# Patient Record
Sex: Male | Born: 1955 | Race: White | Hispanic: No | Marital: Married | Smoking: Never smoker
Health system: Southern US, Community
[De-identification: ages and names within clinical notes are randomized; demographics above are authoritative.]

## PROBLEM LIST (undated history)

## (undated) DIAGNOSIS — E785 Hyperlipidemia, unspecified: Secondary | ICD-10-CM

## (undated) DIAGNOSIS — M1990 Unspecified osteoarthritis, unspecified site: Secondary | ICD-10-CM

## (undated) DIAGNOSIS — J45909 Unspecified asthma, uncomplicated: Secondary | ICD-10-CM

## (undated) DIAGNOSIS — T7840XA Allergy, unspecified, initial encounter: Secondary | ICD-10-CM

## (undated) DIAGNOSIS — K219 Gastro-esophageal reflux disease without esophagitis: Secondary | ICD-10-CM

## (undated) DIAGNOSIS — N200 Calculus of kidney: Secondary | ICD-10-CM

## (undated) DIAGNOSIS — Z87442 Personal history of urinary calculi: Secondary | ICD-10-CM

## (undated) DIAGNOSIS — D509 Iron deficiency anemia, unspecified: Secondary | ICD-10-CM

## (undated) DIAGNOSIS — H6640 Suppurative otitis media, unspecified, unspecified ear: Secondary | ICD-10-CM

## (undated) DIAGNOSIS — R7309 Other abnormal glucose: Secondary | ICD-10-CM

## (undated) DIAGNOSIS — I10 Essential (primary) hypertension: Secondary | ICD-10-CM

## (undated) HISTORY — DX: Suppurative otitis media, unspecified, unspecified ear: H66.40

## (undated) HISTORY — DX: Allergy, unspecified, initial encounter: T78.40XA

## (undated) HISTORY — PX: HERNIA REPAIR: SHX51

## (undated) HISTORY — DX: Other abnormal glucose: R73.09

## (undated) HISTORY — DX: Gastro-esophageal reflux disease without esophagitis: K21.9

## (undated) HISTORY — PX: BACK SURGERY: SHX140

## (undated) HISTORY — PX: OTHER SURGICAL HISTORY: SHX169

## (undated) HISTORY — DX: Hyperlipidemia, unspecified: E78.5

## (undated) HISTORY — PX: TONSILLECTOMY: SUR1361

## (undated) HISTORY — DX: Unspecified asthma, uncomplicated: J45.909

## (undated) HISTORY — DX: Iron deficiency anemia, unspecified: D50.9

---

## 1980-01-30 HISTORY — PX: BIOPSY BOWEL: PRO7

## 1999-01-30 DIAGNOSIS — N200 Calculus of kidney: Secondary | ICD-10-CM

## 1999-01-30 HISTORY — DX: Calculus of kidney: N20.0

## 2002-01-09 ENCOUNTER — Encounter (HOSPITAL_BASED_OUTPATIENT_CLINIC_OR_DEPARTMENT_OTHER): Payer: Self-pay | Admitting: General Surgery

## 2002-01-12 ENCOUNTER — Ambulatory Visit (HOSPITAL_COMMUNITY): Admission: RE | Admit: 2002-01-12 | Discharge: 2002-01-12 | Payer: Self-pay | Admitting: General Surgery

## 2006-07-02 LAB — HM COLONOSCOPY

## 2009-07-07 ENCOUNTER — Encounter: Payer: Self-pay | Admitting: Family Medicine

## 2009-07-22 ENCOUNTER — Ambulatory Visit: Payer: Self-pay | Admitting: Family Medicine

## 2009-07-22 DIAGNOSIS — R944 Abnormal results of kidney function studies: Secondary | ICD-10-CM | POA: Insufficient documentation

## 2009-07-22 DIAGNOSIS — D509 Iron deficiency anemia, unspecified: Secondary | ICD-10-CM | POA: Insufficient documentation

## 2009-07-22 DIAGNOSIS — E785 Hyperlipidemia, unspecified: Secondary | ICD-10-CM | POA: Insufficient documentation

## 2009-07-22 DIAGNOSIS — J45909 Unspecified asthma, uncomplicated: Secondary | ICD-10-CM | POA: Insufficient documentation

## 2009-07-22 DIAGNOSIS — R7309 Other abnormal glucose: Secondary | ICD-10-CM | POA: Insufficient documentation

## 2009-07-22 DIAGNOSIS — K219 Gastro-esophageal reflux disease without esophagitis: Secondary | ICD-10-CM | POA: Insufficient documentation

## 2009-07-22 HISTORY — DX: Hyperlipidemia, unspecified: E78.5

## 2009-07-22 HISTORY — DX: Unspecified asthma, uncomplicated: J45.909

## 2009-07-22 HISTORY — DX: Other abnormal glucose: R73.09

## 2009-07-22 HISTORY — DX: Gastro-esophageal reflux disease without esophagitis: K21.9

## 2009-07-22 HISTORY — DX: Iron deficiency anemia, unspecified: D50.9

## 2009-07-26 ENCOUNTER — Ambulatory Visit: Payer: Self-pay | Admitting: Family Medicine

## 2009-07-28 ENCOUNTER — Telehealth: Payer: Self-pay | Admitting: Family Medicine

## 2009-08-03 LAB — CONVERTED CEMR LAB
BUN: 18 mg/dL (ref 6–23)
CO2: 32 meq/L (ref 19–32)
Calcium: 9.2 mg/dL (ref 8.4–10.5)
Chloride: 104 meq/L (ref 96–112)
Creatinine, Ser: 1.2 mg/dL (ref 0.4–1.5)
Ferritin: 20.9 ng/mL — ABNORMAL LOW (ref 22.0–322.0)
GFR calc non Af Amer: 67.24 mL/min (ref >60)
Glucose, Bld: 107 mg/dL — ABNORMAL HIGH (ref 70–99)
Iron: 65 ug/dL (ref 42–165)
Potassium: 4.8 meq/L (ref 3.5–5.1)
Saturation Ratios: 15.2 % — ABNORMAL LOW (ref 20.0–50.0)
Sodium: 140 meq/L (ref 135–145)
Transferrin: 305.4 mg/dL (ref 212.0–360.0)

## 2009-09-26 ENCOUNTER — Encounter: Payer: Self-pay | Admitting: Family Medicine

## 2009-10-06 ENCOUNTER — Encounter: Payer: Self-pay | Admitting: Family Medicine

## 2009-11-09 ENCOUNTER — Ambulatory Visit: Payer: Self-pay | Admitting: Family Medicine

## 2009-11-09 DIAGNOSIS — H6640 Suppurative otitis media, unspecified, unspecified ear: Secondary | ICD-10-CM

## 2009-11-09 HISTORY — DX: Suppurative otitis media, unspecified, unspecified ear: H66.40

## 2009-11-18 ENCOUNTER — Ambulatory Visit: Payer: Self-pay | Admitting: Family Medicine

## 2010-01-11 ENCOUNTER — Ambulatory Visit: Payer: Self-pay | Admitting: Family Medicine

## 2010-01-12 LAB — CONVERTED CEMR LAB
Basophils Absolute: 0 10*3/uL (ref 0.0–0.1)
Basophils Relative: 0.7 % (ref 0.0–3.0)
Eosinophils Absolute: 0.2 10*3/uL (ref 0.0–0.7)
Eosinophils Relative: 3.4 % (ref 0.0–5.0)
Ferritin: 16.1 ng/mL — ABNORMAL LOW (ref 22.0–322.0)
HCT: 37.6 % — ABNORMAL LOW (ref 39.0–52.0)
Hemoglobin: 13 g/dL (ref 13.0–17.0)
Iron: 55 ug/dL (ref 42–165)
Lymphocytes Relative: 35.4 % (ref 12.0–46.0)
Lymphs Abs: 1.9 10*3/uL (ref 0.7–4.0)
MCHC: 34.6 g/dL (ref 30.0–36.0)
MCV: 86.5 fL (ref 78.0–100.0)
Monocytes Absolute: 0.4 10*3/uL (ref 0.1–1.0)
Monocytes Relative: 7.6 % (ref 3.0–12.0)
Neutro Abs: 2.8 10*3/uL (ref 1.4–7.7)
Neutrophils Relative %: 52.9 % (ref 43.0–77.0)
Platelets: 198 10*3/uL (ref 150.0–400.0)
RBC: 4.35 M/uL (ref 4.22–5.81)
RDW: 13.3 % (ref 11.5–14.6)
WBC: 5.3 10*3/uL (ref 4.5–10.5)

## 2010-02-28 NOTE — Letter (Signed)
Summary: [facility]  [facility]   Imported By: Sherian Rein 10/05/2009 10:33:19  _____________________________________________________________________  External Attachment:    Type:   Image     Comment:   External Document

## 2010-02-28 NOTE — Assessment & Plan Note (Signed)
Summary: NEW TO EST//CCM   Vital Signs:  Patient profile:   55 year old male Height:      73.50 inches Weight:      219 pounds BMI:     28.60 Temp:     98.4 degrees F oral Pulse rate:   80 / minute Pulse rhythm:   regular Resp:     12 per minute BP sitting:   138 / 98  (left arm) Cuff size:   regular  Vitals Entered By: Sid Falcon LPN (July 22, 2009 3:00 PM)  Nutrition Counseling: Patient's BMI is greater than 25 and therefore counseled on weight management options. CC: new to establish   History of Present Illness: New pt to establish care.  PMH reviewed and signif for :  Hx kidney stones, GERD, hyperlipidemia, childhood asthma, ?hx rheumatic fever, colon polyps (benign), and Crohn's Disease.  Lipids and GERD stable.  REcent outside labs reviewed.  Recent labs through his company signif for: glucose of 106 (119 one year ago) creatinine 1.48 (1.38 one year ago) and no prior evaluation. TSH high normal range (4.7 but 6.14 one year ago). Mildly low serum iron of 38 with normal hgb and normal Fe one year ago.  EGD and colonoscopy 2007 or 2008 and reportedly normal.  No recent abd pain, appetite change, melena, hematochezia, or weight change.  no diarrhea.  Nonvegetarian.  FH signif for Father with colon cancer.  Grandparents with hypertension and stroke.  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  nonsmoker.  Occ ETOH.  Preventive Screening-Counseling & Management  Alcohol-Tobacco     Smoking Status: never  Caffeine-Diet-Exercise     Does Patient Exercise: no  Past History:  Family History: Last updated: 07/22/2009 Father, colon cancer maternal grandparents, stroke, hypertension  Social History: Last updated: 07/22/2009 Occupation:  Energy manager Married Never Smoked Alcohol use-yes Regular exercise-no  Risk Factors: Exercise: no (07/22/2009)  Risk Factors: Smoking Status: never (07/22/2009)  Past Medical History: Asthma GERD Hyperlipidemia Kidney  stones Polyps in colon ?rheumatic fever blood transfusion 1981 prediabetes  Past Surgical History: Exploratory bowel surgery - chrons disease 1981  Family History: Father, colon cancer maternal grandparents, stroke, hypertension  Social History: Occupation:  Energy manager Married Never Smoked Alcohol use-yes Regular exercise-no Smoking Status:  never Occupation:  employed Does Patient Exercise:  no  Review of Systems  The patient denies anorexia, weight loss, weight gain, chest pain, syncope, dyspnea on exertion, peripheral edema, prolonged cough, headaches, hemoptysis, abdominal pain, melena, hematochezia, severe indigestion/heartburn, muscle weakness, and enlarged lymph nodes.    Physical Exam  General:  Well-developed,well-nourished,in no acute distress; alert,appropriate and cooperative throughout examination Eyes:  pupils equal, pupils round, and pupils reactive to light.   Ears:  External ear exam shows no significant lesions or deformities.  Otoscopic examination reveals clear canals, tympanic membranes are intact bilaterally without bulging, retraction, inflammation or discharge. Hearing is grossly normal bilaterally. Mouth:  Oral mucosa and oropharynx without lesions or exudates.  Teeth in good repair. Neck:  No deformities, masses, or tenderness noted. Lungs:  Normal respiratory effort, chest expands symmetrically. Lungs are clear to auscultation, no crackles or wheezes. Heart:  Normal rate and regular rhythm. S1 and S2 normal without gallop, murmur, click, rub or other extra sounds. Abdomen:  Bowel sounds positive,abdomen soft and non-tender without masses, organomegaly or hernias noted. Extremities:  No clubbing, cyanosis, edema, or deformity noted with normal full range of motion of all joints.   Neurologic:  alert & oriented X3 and cranial nerves  II-XII intact.   Cervical Nodes:  No lymphadenopathy noted Psych:  normally interactive, good eye contact, not anxious  appearing, and not depressed appearing.     Impression & Recommendations:  Problem # 1:  PREDIABETES (ICD-790.29) discussed diet and lifestsyle management.  will schedule repeat lab with fasting glucose.  Problem # 2:  ANEMIA, IRON DEFICIENCY (ICD-280.9) Low Fe of uncertain signif .  Pt did donate blood 2 weeks prior but doubt signif.  will repeat along with ferritin and TIBC. If iron remains low consult with GI.  Problem # 3:  GERD (ICD-530.81) stable on current med. His updated medication list for this problem includes:    Protonix 40 Mg Tbec (Pantoprazole sodium) ..... Once daily  Problem # 4:  HYPERLIPIDEMIA (ICD-272.4) recent labs reviewed and lipids stable. His updated medication list for this problem includes:    Simvastatin 40 Mg Tabs (Simvastatin) ..... One by mouth at bedtime  Problem # 5:  NONSPECIFIC ABNORM RESULTS KIDNEY FUNCTION STUDY (ICD-794.4) new finding.  Will repeat.  Complete Medication List: 1)  Protonix 40 Mg Tbec (Pantoprazole sodium) .... Once daily 2)  Simvastatin 40 Mg Tabs (Simvastatin) .... One by mouth at bedtime  Patient Instructions: 1)  Schedule the following labs: 2)  BMP  790.29 3)  Ferritin, serum iron, TIBC  280.9 Prescriptions: SIMVASTATIN 40 MG TABS (SIMVASTATIN) one by mouth at bedtime  #90 x 3   Entered and Authorized by:   Evelena Peat MD   Signed by:   Evelena Peat MD on 07/22/2009   Method used:   Faxed to ...       [facility] (mail-order)       [address]       [place], [place]  [address]       Ph: [phone]       Fax: [phone]   RxID:   [id number] PROTONIX 40 MG TBEC (PANTOPRAZOLE SODIUM) once daily  #90 x 3   Entered and Authorized by:   Evelena Peat MD   Signed by:   Evelena Peat MD on 07/22/2009   Method used:   Faxed to ...       [address] [facility] (mail-order)       [address]       [place], [place]  [address]       Ph: [phone]       Fax: [phone]   RxID:   [id number]   Preventive Care  Screening  Colonoscopy:    Date:  06/30/2006    Results:  Adenomatous Polyp

## 2010-02-28 NOTE — Procedures (Signed)
Summary: EGD, Colonoscopy Reports/[facility]  EGD, Colonoscopy Reports/[facility]   Imported By: Maryln Gottron 10/13/2009 14:32:08  _____________________________________________________________________  External Attachment:    Type:   Image     Comment:   External Document

## 2010-02-28 NOTE — Assessment & Plan Note (Signed)
Summary: EAR INFECTION//SLM   Vital Signs:  Patient profile:   55 year old male Temp:     98.0 degrees F oral BP sitting:   136 / 88  (left arm) Cuff size:   regular  Vitals Entered By: Sid Falcon LPN (November 09, 2009 2:11 PM)  History of Present Illness: Chronic drainage right ear greater than left for over one year. Mostly clear but recently colored discharge. Intermittent ringing right ear but no hearing change. No fevers or chills. No nasal congestive symptoms.  No ear pain.  No known hx of perforation.  No hx of T tubes.  Recent evaluation per GI for mild iron deficiency in absence of anemia.  Allergies (verified): No Known Drug Allergies  Past History:  Past Medical History: Last updated: 07/22/2009 Asthma GERD Hyperlipidemia Kidney stones Polyps in colon ?rheumatic fever blood transfusion 1981 prediabetes  Physical Exam  General:  Well-developed,well-nourished,in no acute distress; alert,appropriate and cooperative throughout examination Ears:  right eardrum reveals erythema, distortion of landmarks, and supperative effusion with some purulent drainage in the canal. Left eardrum and ear canal appear relatively normal Mouth:  Oral mucosa and oropharynx without lesions or exudates.  Teeth in good repair. Neck:  No deformities, masses, or tenderness noted.   Impression & Recommendations:  Problem # 1:  OTITIS MEDIA, SUPPURATIVE (ICD-382.4) start oral and topical antibiotic and keep ear dry.  consider ENT referral if persists. His updated medication list for this problem includes:    Amoxicillin 875 Mg Tabs (Amoxicillin) ..... One by mouth two times a day for 10 days  Complete Medication List: 1)  Protonix 40 Mg Tbec (Pantoprazole sodium) .... Once daily 2)  Simvastatin 40 Mg Tabs (Simvastatin) .... One by mouth at bedtime 3)  Amoxicillin 875 Mg Tabs (Amoxicillin) .... One by mouth two times a day for 10 days 4)  Cipro Hc 0.2-1 % Susp  (Ciprofloxacin-hydrocortisone) .... 5 drops r ear two times a day for 7 days  Patient Instructions: 1)  Schedule the following labs for early December:  Ferritin, Serum Fe, CBC  280.9 Prescriptions: AMOXICILLIN 875 MG TABS (AMOXICILLIN) one by mouth two times a day for 10 days  #20 x 0   Entered and Authorized by:   Evelena Peat MD   Signed by:   Evelena Peat MD on 11/09/2009   Method used:   Electronically to        [facility] [facility] [phone]* (retail)       [address] [place], [place]  [address]       Ph: [phone] or [phone]       Fax: [phone]   RxID:   [id number] CIPRO HC 0.2-1 % SUSP (CIPROFLOXACIN-HYDROCORTISONE) 5 drops R ear two times a day for 7 days  #10 ml x 1   Entered and Authorized by:   Evelena Peat MD   Signed by:   Evelena Peat MD on 11/09/2009   Method used:   Electronically to        [facility]  [address] [phone]* (retail)       [address] [place], [place]  [address]       Ph: [phone] or [phone]       Fax: [phone]   RxID:   [id number] AMOXICILLIN 875 MG TABS (AMOXICILLIN) one by mouth two times a  day for 10 days  #20 x 0   Entered and Authorized by:   Evelena Peat MD   Signed by:   Evelena Peat MD on 11/09/2009   Method used:   Print then Give to Patient   RxID:   [id number]

## 2010-02-28 NOTE — Progress Notes (Signed)
Summary: Pt returning Dr Lucie Leather call  Phone Note Other Incoming Call back at Work Phone [phone]   Caller: Patient Summary of Call: VM from pt returning your call from yesterday.  Please call him at work today [phone] Initial call taken by: Sid Falcon LPN,  July 28, 2009 9:02 AM  Follow-up for Phone Call        spoke with pt.  Pt has low ferritin and Fe saturation.  Normal Fe.  Hgb normal recently.  colonoscopy 3 years ago per pt.  Will discuss with his GI regarding whether to evaluate further.  Minimally consider hemoccult cards. Follow-up by: Evelena Peat MD,  July 28, 2009 6:04 PM

## 2010-02-28 NOTE — Assessment & Plan Note (Signed)
Summary: EAR ISSUES//CCM   Vital Signs:  Patient profile:   55 year old male Temp:     98.7 degrees F oral BP sitting:   138 / 94  (left arm) Cuff size:   large  Vitals Entered By: Sid Falcon LPN (November 18, 2009 3:59 PM)  History of Present Illness: Followup right supperative otitis media with evidence for purulent secretions in canal. Patient was treated with Cipro HC drops and amoxicillin. Overall slightly improved still has some fullness in the right ear. No vertigo. No hearing changes. Denies fever or chills. Pain seems to be worse early in the morning.  Allergies (verified): No Known Drug Allergies  Past History:  Past Medical History: Last updated: 07/22/2009 Asthma GERD Hyperlipidemia Kidney stones Polyps in colon ?rheumatic fever blood transfusion 1981 prediabetes  Physical Exam  General:  Well-developed,well-nourished,in no acute distress; alert,appropriate and cooperative throughout examination Ears:  L TM normal.  R TM is retracted and moderately erythematous.  Minimal drainage in canal.  No obvious perforations. Mouth:  Oral mucosa and oropharynx without lesions or exudates.  Teeth in good repair. Neck:  No deformities, masses, or tenderness noted. Lungs:  Normal respiratory effort, chest expands symmetrically. Lungs are clear to auscultation, no crackles or wheezes.   Impression & Recommendations:  Problem # 1:  OTITIS MEDIA, SUPPURATIVE (ICD-382.4) Cont cipro drops and start Augmentin.  ENT referral if no better in one week. His updated medication list for this problem includes:    Amoxicillin-pot Clavulanate 875-125 Mg Tabs (Amoxicillin-pot clavulanate) ..... One by mouth two times a day for 10 days  Complete Medication List: 1)  Protonix 40 Mg Tbec (Pantoprazole sodium) .... Once daily 2)  Simvastatin 40 Mg Tabs (Simvastatin) .... One by mouth at bedtime 3)  Amoxicillin-pot Clavulanate 875-125 Mg Tabs (Amoxicillin-pot clavulanate) .... One by  mouth two times a day for 10 days 4)  Cipro Hc 0.2-1 % Susp (Ciprofloxacin-hydrocortisone) .... 5 drops r ear two times a day for 7 days  Patient Instructions: 1)  Be in touch by next week if symptoms not further improved. Prescriptions: AMOXICILLIN-POT CLAVULANATE 875-125 MG TABS (AMOXICILLIN-POT CLAVULANATE) one by mouth two times a day for 10 days  #20 x 0   Entered and Authorized by:   Evelena Peat MD   Signed by:   Evelena Peat MD on 11/18/2009   Method used:   [facility]ron[facility] to        [facility]  [address] [facility] [phone]* (retail)       [address] [place], [place]  [address]       Ph: [phone] or [phone]       Fax: [phone]   RxID:   [id number]    Orders Added: 1)  Est. Patient Level III [[address]]

## 2010-06-16 NOTE — Op Note (Signed)
NAME:  DRAE, MITZEL                          ACCOUNT NO.:  [id number]   MEDICAL RECORD NO.:  [id number]                   PATIENT TYPE:  OIB   LOCATION:  2896                                 FACILITY:  [facility]   PHYSICIAN:  Leonie Man, M.D.                DATE OF BIRTH:  1956/01/12   DATE OF PROCEDURE:  01/12/2002  DATE OF DISCHARGE:                                 OPERATIVE REPORT   PREOPERATIVE DIAGNOSIS:  Left inguinal hernia.   POSTOPERATIVE DIAGNOSIS:  Left inguinal hernia.   PROCEDURE:  Left inguinal herniorrhaphy with mesh.   SURGEON:  Leonie Man, M.D.   ASSISTANT:  Nurse.   ANESTHESIA:  General.   BRIEF NOTE:  The patient is a 55 year old man presenting with an enlarging  left-sided groin bulge.  This has been present for the past 3-6 months with  some mild associated discomfort on coughing.  No history of incarceration,  bladder neck obstruction or chronic constipation.  The patient comes to the  operating room now after the risks and potential benefits of surgery had  been discussed.  All questions were answered and consent was obtained.   DESCRIPTION OF PROCEDURE:  Following the induction of satisfactory general  anesthesia, the patient was positioned supinely.  The lower abdomen was  prepped and draped to be included in a sterile operative field.  The region  of the lower abdominal crease was infiltrated with 0.5% Marcaine with  epinephrine and a transverse incision was made and deepened through the skin  and subcutaneous tissues down to the external oblique aponeurosis.  External  ring was identified and the external oblique aponeurosis was opened up  through the external ring with protection of the ilioinguinal nerve, which  was retracted medially and cephalad.  The spermatic cord was then elevated  and held with a Penrose drain and a large direct hernia was dissected free  from the medial aspect of the cord and reduced into the retroperitoneum.  A  large cord lipoma was dissected free up to the internal ring and also  transected between clamps and secured with ties of 2-0 silk.  There was no  indirect hernia noted.  The defect was then repaired with an onlay patch of  polypropylene mesh, which was sewn in at the pubic tubercle with a 2-0  Novofil suture and continued up along the conjoined tendon in a continuous  running suture up to the internal ring and again from the pubic tubercle up  along the shelving edge of Poupart's ligament to the internal ring.  Tails  of the mesh were then trimmed and sutured into the internal oblique muscle  thus closing off the internal ring and forming a new internal ring with  occlusion of the mesh.  The hernia repair was inspected and noted to be  intact.  Sponge, instrument and sharp counts were noted and verified.  Spermatic cord was  returned to its normal anatomic position.  All areas of  dissection were checked for hemostasis and noted to be dry.  External  oblique aponeurosis then closed over the cord with a running suture of 2-0  Vicryl.  Scarpa's fascia and subcuticular tissue were closed with a running  suture of 3-0 Vicryl.  Skin closed with a 4-0 Monocryl running suture.  The  wound was then reinforced with Steri-Strips.  Sterile dressings were  applied.  The anesthetic was reversed and the patient was removed from the  operating room to the recovery room in stable condition.  He tolerated the  procedure well.                                                Leonie Man, M.D.    PB/MEDQ  D:  01/12/2002  T:  01/12/2002  Job:  [id number]   cc:   Teena Irani. Arlyce Dice, M.D.  [address]  [place]  [place] [address]  Fax: [phone]

## 2010-08-01 ENCOUNTER — Ambulatory Visit (INDEPENDENT_AMBULATORY_CARE_PROVIDER_SITE_OTHER): Payer: Managed Care, Other (non HMO) | Admitting: Family Medicine

## 2010-08-01 ENCOUNTER — Encounter: Payer: Self-pay | Admitting: Family Medicine

## 2010-08-01 VITALS — BP 130/82 | Temp 98.1°F | Ht 73.5 in | Wt 217.0 lb

## 2010-08-01 DIAGNOSIS — Z299 Encounter for prophylactic measures, unspecified: Secondary | ICD-10-CM

## 2010-08-01 DIAGNOSIS — K219 Gastro-esophageal reflux disease without esophagitis: Secondary | ICD-10-CM

## 2010-08-01 DIAGNOSIS — E785 Hyperlipidemia, unspecified: Secondary | ICD-10-CM

## 2010-08-01 DIAGNOSIS — H65499 Other chronic nonsuppurative otitis media, unspecified ear: Secondary | ICD-10-CM

## 2010-08-01 MED ORDER — PANTOPRAZOLE SODIUM 40 MG PO TBEC: 40.0000 mg | DELAYED_RELEASE_TABLET | Freq: Every day | ORAL | Status: DC

## 2010-08-01 MED ORDER — SIMVASTATIN 40 MG PO TABS: 40.0000 mg | ORAL_TABLET | Freq: Every day | ORAL | Status: DC

## 2010-08-01 NOTE — Progress Notes (Signed)
  Subjective:    Patient ID: Eric Duran, male    DOB: 05-06-1955, 55 y.o.   MRN: [id number]  HPI Patient seen with several month history of bilateral ear pressure and intermittent tinnitus.  He also has some relatively continual clear drainage in both ear canals right greater than left. He feels hearing is off somewhat. Has intermittent headaches. No fever. Ear drainage mostly clear but occasionally slightly yellow. Denies any nasal congestion or  purulent nasal discharge.  Patient history hyperlipidemia and reflux. Symptoms stable. Needs refills of protonix and simvastatin. He has not had lipids in approximately one year. No history of CAD.  Past Medical History  Diagnosis Date  . HYPERLIPIDEMIA 07/22/2009  . ANEMIA, IRON DEFICIENCY 07/22/2009  . OTITIS MEDIA, SUPPURATIVE 11/09/2009  . ASTHMA 07/22/2009  . GERD 07/22/2009  . PREDIABETES 07/22/2009  . NONSPECIFIC ABNORM RESULTS KIDNEY FUNCTION STUDY 07/22/2009   Past Surgical History  Procedure Date  . Biopsy bowel     exploration bowel surgery, chrons disease    reports that he has never smoked. He does not have any smokeless tobacco history on file. His alcohol and drug histories not on file. family history includes Cancer in his father; Hypertension in his maternal grandfather; and Stroke in his maternal grandfather. No Known Allergies    Review of Systems  Constitutional: Negative for fever and chills.  HENT: Positive for tinnitus and ear discharge. Negative for ear pain, congestion, sore throat and postnasal drip.   Respiratory: Negative for cough and shortness of breath.   Cardiovascular: Negative for chest pain.  Hematological: Negative for adenopathy.       Objective:   Physical Exam  Constitutional: He appears well-developed and well-nourished.  HENT:  Head: Normocephalic and atraumatic.  Mouth/Throat: Oropharynx is clear and moist. No oropharyngeal exudate.       Patient has inflammatory changes right ear canal with  mild erythema and scaling. Nontender. No purulent secretions. Right tympanic membrane has distorted landmarks. No erythema. No obvious perforations. Left TM is relatively normal. No significant inflammatory changes left ear canal  Neck: Neck supple.  Cardiovascular: Normal rate, regular rhythm and normal heart sounds.   Pulmonary/Chest: Effort normal and breath sounds normal. No respiratory distress. He has no wheezes. He has no rales.  Musculoskeletal: He exhibits no edema.  Lymphadenopathy:    He has no cervical adenopathy.          Assessment & Plan:  #1 chronic drainage from both ears right greater than left. Suspect chronic effusion. He does not have evidence for active otitis externa. Given duration of symptoms recommend ENT referral #2 hyperlipidemia. Refill simvastatin for one year. Schedule fasting labs with lipid and hepatic and one to 2 months #3 GERD stable.  refill protonix for one year

## 2010-08-16 ENCOUNTER — Telehealth: Payer: Self-pay | Admitting: *Deleted

## 2010-08-16 DIAGNOSIS — E785 Hyperlipidemia, unspecified: Secondary | ICD-10-CM

## 2010-08-16 NOTE — Telephone Encounter (Signed)
[facility] called, his lab order is standing, needs to be changed to future so they can draw

## 2010-08-16 NOTE — Telephone Encounter (Signed)
Reordered labs.

## 2010-09-06 ENCOUNTER — Other Ambulatory Visit: Payer: Managed Care, Other (non HMO)

## 2010-09-08 ENCOUNTER — Other Ambulatory Visit: Payer: Managed Care, Other (non HMO)

## 2010-09-08 ENCOUNTER — Ambulatory Visit: Payer: Managed Care, Other (non HMO)

## 2010-09-08 DIAGNOSIS — E785 Hyperlipidemia, unspecified: Secondary | ICD-10-CM

## 2010-09-08 LAB — HEPATIC FUNCTION PANEL
ALT: 29 U/L (ref 0–53)
AST: 23 U/L (ref 0–37)
Albumin: 4.2 g/dL (ref 3.5–5.2)
Total Bilirubin: 0.8 mg/dL (ref 0.3–1.2)

## 2010-09-08 LAB — LIPID PANEL
HDL: 58.7 mg/dL (ref >39.00)
Triglycerides: 30 mg/dL (ref 0.0–149.0)

## 2010-09-12 NOTE — Progress Notes (Signed)
Quick Note:  Pt informed on home VM ______ 

## 2010-09-19 ENCOUNTER — Other Ambulatory Visit: Payer: Self-pay | Admitting: *Deleted

## 2010-09-19 DIAGNOSIS — K219 Gastro-esophageal reflux disease without esophagitis: Secondary | ICD-10-CM

## 2010-09-19 MED ORDER — SIMVASTATIN 40 MG PO TABS: 40.0000 mg | ORAL_TABLET | Freq: Every day | ORAL | Status: DC

## 2010-09-19 MED ORDER — PANTOPRAZOLE SODIUM 40 MG PO TBEC: 40.0000 mg | DELAYED_RELEASE_TABLET | Freq: Every day | ORAL | Status: DC

## 2011-02-13 ENCOUNTER — Encounter: Payer: Self-pay | Admitting: Family Medicine

## 2011-02-13 ENCOUNTER — Ambulatory Visit (INDEPENDENT_AMBULATORY_CARE_PROVIDER_SITE_OTHER): Payer: Managed Care, Other (non HMO) | Admitting: Family Medicine

## 2011-02-13 VITALS — BP 118/80 | HR 98 | Temp 98.1°F | Wt 226.0 lb

## 2011-02-13 DIAGNOSIS — K5090 Crohn's disease, unspecified, without complications: Secondary | ICD-10-CM | POA: Insufficient documentation

## 2011-02-13 DIAGNOSIS — R1013 Epigastric pain: Secondary | ICD-10-CM

## 2011-02-13 NOTE — Patient Instructions (Signed)
Follow up immediately for any fever, recurrent vomiting, or persistent abdominal pain Bland diet for the next several days.

## 2011-02-13 NOTE — Progress Notes (Addendum)
  Subjective:    Patient ID: Eric Duran, male    DOB: 24-Mar-1955, 56 y.o.   MRN: [id number]  HPI  Acute visit. Abdominal pain epigastric area fairly intense past couple of nights. Woke last night and Sunday night out of sleep with epigastric pain with some radiation bilaterally-and also toward back. Deep achy quality. Fairly intense. Each episode lasted about one to 2 hours. He did not have any nausea or vomiting. Some radiation toward the back. He had pizza on Sunday night and had some ice cream last night. Otherwise low-fat diet. He takes Protonix regularly. No recent active GERD symptoms. Denies chest pain. No fever or chills. No recent stool changes.   Patient's history of Crohn's disease but no recent active issues. No obstructive symptoms such as vomiting or distention.  Past Medical History  Diagnosis Date  . HYPERLIPIDEMIA 07/22/2009  . ANEMIA, IRON DEFICIENCY 07/22/2009  . OTITIS MEDIA, SUPPURATIVE 11/09/2009  . ASTHMA 07/22/2009  . GERD 07/22/2009  . PREDIABETES 07/22/2009  . NONSPECIFIC ABNORM RESULTS KIDNEY FUNCTION STUDY 07/22/2009   Past Surgical History  Procedure Date  . Biopsy bowel     exploration bowel surgery, chrons disease    reports that he has never smoked. He does not have any smokeless tobacco history on file. His alcohol and drug histories not on file. family history includes Cancer in his father; Hypertension in his maternal grandfather; and Stroke in his maternal grandfather. No Known Allergies    Review of Systems  Constitutional: Negative for fever and chills.  HENT: Negative for trouble swallowing.   Respiratory: Negative for shortness of breath.   Cardiovascular: Negative for chest pain, palpitations and leg swelling.  Gastrointestinal: Positive for abdominal pain. Negative for nausea, vomiting, diarrhea, constipation and blood in stool.  Genitourinary: Negative for dysuria.  Skin: Negative for rash.  Neurological: Negative for dizziness.    Hematological: Negative for adenopathy. Does not bruise/bleed easily.       Objective:   Physical Exam  Constitutional: He appears well-developed and well-nourished.  Cardiovascular: Normal rate, regular rhythm and normal heart sounds.   Pulmonary/Chest: Effort normal and breath sounds normal. No respiratory distress. He has no wheezes. He has no rales.  Abdominal: Soft. Bowel sounds are normal. He exhibits no distension and no mass. There is no rebound and no guarding.       Minimally tender epigastric region  Skin: No rash noted.          Assessment & Plan:  Epigastric abdominal pain. Given the episodic nature waking from sleep question symptomatic cholelithiasis. Clinically, this would not be compatible with pancreatitis and very low risk for peptic ulcer disease with protonix. No risk factors for gastritis.  No chest pain and no exertional symptoms so cardiac seems very unlikely.  Start with abdominal ultrasound.  Ultrasound reveals multiple gallstones. No gallbladder wall thickening. Patient denied further episodes. Discussed options. We've recommended seeing a general surgeon although explained observation might be recommended at this time if he has no further episodes

## 2011-02-14 ENCOUNTER — Ambulatory Visit
Admission: RE | Admit: 2011-02-14 | Discharge: 2011-02-14 | Disposition: A | Discharge status: Home / Self Care | Payer: Managed Care, Other (non HMO) | Source: Ambulatory Visit | Attending: Family Medicine | Admitting: Family Medicine

## 2011-02-14 DIAGNOSIS — R1013 Epigastric pain: Secondary | ICD-10-CM

## 2011-02-14 NOTE — Progress Notes (Signed)
Addended by: Kristian Covey on: 02/14/2011 01:06 PM   Modules accepted: Orders

## 2011-02-16 ENCOUNTER — Encounter (INDEPENDENT_AMBULATORY_CARE_PROVIDER_SITE_OTHER): Payer: Self-pay | Admitting: Surgery

## 2011-02-20 ENCOUNTER — Encounter (INDEPENDENT_AMBULATORY_CARE_PROVIDER_SITE_OTHER): Payer: Self-pay | Admitting: Surgery

## 2011-02-21 ENCOUNTER — Encounter (INDEPENDENT_AMBULATORY_CARE_PROVIDER_SITE_OTHER): Payer: Managed Care, Other (non HMO) | Admitting: Surgery

## 2011-02-22 ENCOUNTER — Ambulatory Visit (INDEPENDENT_AMBULATORY_CARE_PROVIDER_SITE_OTHER): Payer: Managed Care, Other (non HMO) | Admitting: Surgery

## 2011-02-22 ENCOUNTER — Encounter (INDEPENDENT_AMBULATORY_CARE_PROVIDER_SITE_OTHER): Payer: Self-pay | Admitting: Surgery

## 2011-02-22 DIAGNOSIS — R1011 Right upper quadrant pain: Secondary | ICD-10-CM

## 2011-02-22 DIAGNOSIS — K8020 Calculus of gallbladder without cholecystitis without obstruction: Secondary | ICD-10-CM | POA: Insufficient documentation

## 2011-02-22 NOTE — Patient Instructions (Signed)
[facility] LAPAROSCOPIC SURGERY: POST OP INSTRUCTIONS  Always review your discharge instruction sheet given to you by the facility where your surgery was performed.  1. A prescription for pain medication may be given to you upon discharge.  Take your pain medication as prescribed, if needed.  If narcotic pain medicine is not needed, then you may take acetaminophen (Tylenol) or ibuprofen (Advil) as needed. 2. Take your usually prescribed medications unless otherwise directed. 3. If you need a refill on your pain medication, please contact your pharmacy.  They will contact our office to request authorization. Prescriptions will not be filled after 5pm or on week-ends. 4. You should follow a light diet the first few days after arrival home, such as soup and crackers, etc.  Be sure to include lots of fluids daily. 5. Most patients will experience some swelling and bruising in the area of the incisions.  Ice packs will help.  Swelling and bruising can take several days to resolve.  6. It is common to experience some constipation if taking pain medication after surgery.  Increasing fluid intake and taking a stool softener (such as Colace) will usually help or prevent this problem from occurring.  A mild laxative (Milk of Magnesia or Miralax) should be taken according to package instructions if there are no bowel movements after 48 hours. 7. Unless discharge instructions indicate otherwise, you may remove your bandages 24-48 hours after surgery, and you may shower at that time.  You may have steri-strips (small skin tapes) in place directly over the incision.  These strips should be left on the skin for 7-10 days.  If your surgeon used skin glue on the incision, you may shower in 24 hours.  The glue will flake off over the next 2-3 weeks.  Any sutures or staples will be removed at the office during your follow-up visit. 8. ACTIVITIES:  You may resume regular (light) daily activities  beginning the next day-such as daily self-care, walking, climbing stairs-gradually increasing activities as tolerated.  You may have sexual intercourse when it is comfortable.  Refrain from any heavy lifting or straining until approved by your doctor. 9. You may drive when you are no longer taking prescription pain medication, you can comfortably wear a seatbelt, and you can safely maneuver your car and apply brakes. 10. You should see your doctor in the office for a follow-up appointment approximately 2-3 weeks after your surgery.  Make sure that you call for this appointment within a day or two after you arrive home to insure a convenient appointment time.  WHEN TO CALL YOUR DOCTOR: 1. Fever over 101.0 2. Inability to urinate 3. Continued bleeding from incision. 4. Increased pain, redness, or drainage from the incision. 5. Increasing abdominal pain  The clinic staff is available to answer your questions during regular business hours.  Please don't hesitate to call and ask to speak to one of the nurses for clinical concerns.  If you have a medical emergency, go to the nearest emergency room or call 911.  A surgeon from [facility] is always on call at the hospital. [phone] ? [phone] ? FAX [phone] Web site: [email or web address]  

## 2011-02-22 NOTE — Progress Notes (Signed)
Chief Complaint  Patient presents with  . Cholelithiasis    evaluate for cholecystectomy - referral by Dr. Evelena Peat, [place] at [facility]   HISTORY: Patient is a 56 year old white male referred by his primary care physician for consideration for cholecystectomy for management of symptomatic cholelithiasis. Patient has had 2 discrete episodes of epigastric abdominal pain radiating to the back. Both of these occurred at night following fatty food meals. One episode was associated with sweats. Patient denies any fever. He denies any history of jaundice or acholic stools. He denies any previous episodes.  There is no family history of hepatobiliary disease. Patient has had prior abdominal surgery with an ileocecal resection in 1982 for Crohn's disease. He is not under any active treatment for inflammatory bowel disease at this time.  Past Medical History  Diagnosis Date  . HYPERLIPIDEMIA 07/22/2009  . ANEMIA, IRON DEFICIENCY 07/22/2009  . OTITIS MEDIA, SUPPURATIVE 11/09/2009  . ASTHMA 07/22/2009  . GERD 07/22/2009  . PREDIABETES 07/22/2009  . NONSPECIFIC ABNORM RESULTS KIDNEY FUNCTION STUDY 07/22/2009     Current Outpatient Prescriptions  Medication Sig Dispense Refill  . pantoprazole (PROTONIX) 40 MG tablet Take 1 tablet (40 mg total) by mouth daily.  90 tablet  1  . simvastatin (ZOCOR) 40 MG tablet Take 1 tablet (40 mg total) by mouth at bedtime.  90 tablet  1     No Known Allergies   Family History  Problem Relation Age of Onset  . Cancer Father     colon  . Hypertension Maternal Grandfather   . Stroke Maternal Grandfather      History   Social History  . Marital Status: Married    Spouse Name: N/A    Number of Children: N/A  . Years of Education: N/A   Social History Main Topics  . Smoking status: Never Smoker   . Smokeless tobacco: Never Used  . Alcohol Use: Yes     occasional glass of wine  . Drug Use: No  . Sexually Active: None   Other Topics Concern  .  None   Social History Narrative  . None     REVIEW OF SYSTEMS - PERTINENT POSITIVES ONLY: 2 episodes of epigastric abdominal pain as noted. Denies jaundice. Denies acholic stools. Denies fever. Denies nausea and emesis.  EXAM: Filed Vitals:   02/22/11 1451  BP: 136/88  Pulse: 68  Temp: 98.3 F (36.8 C)  Resp: 18    HEENT: normocephalic; pupils equal and reactive; sclerae clear; dentition good; mucous membranes moist NECK:  symmetric on extension; no palpable anterior or posterior cervical lymphadenopathy; no supraclavicular masses; no tenderness CHEST: clear to auscultation bilaterally without rales, rhonchi, or wheezes CARDIAC: regular rate and rhythm without significant murmur; peripheral pulses are full ABDOMEN: soft without distension; bowel sounds present; no mass; no hepatosplenomegaly; no hernia;  Well healed right paramedian incision EXT:  non-tender without edema; no deformity NEURO: no gross focal deficits; no sign of tremor   LABORATORY RESULTS: See [facility] (CHL-Epic) for most recent results   RADIOLOGY RESULTS: See [facility] (CHL-Epic) for most recent results   IMPRESSION: Symptomatic cholelithiasis, intermittent biliary colic  PLAN: The patient and I and his wife discussed all of the above findings at length. I believe he has intermittent episodes of biliary colic due to cholelithiasis. We discussed the indications for cholecystectomy. We discussed the technique of laparoscopic cholecystectomy with intraoperative cholangiography. We discussed the potential for conversion to open surgery area and we discussed the hospital stay to  be anticipated and his recovery following the procedure. He understands and wishes to proceed in the near future. We will make arrangements for his surgery at a time convenient for him.  The risks and benefits of the procedure have been discussed at length with the patient.  The patient understands the proposed procedure,  potential alternative treatments, and the course of recovery to be expected.  All of the patient's questions have been answered at this time.  The patient wishes to proceed with surgery and will schedule a date for their procedure through our office staff.   Velora Heckler, MD, FACS General & Endocrine Surgery [facility]   Visit Diagnoses: 1. Gallstones   2. Abdominal pain, right upper quadrant     Primary Care Physician: Kristian Covey, MD, MD

## 2011-02-23 ENCOUNTER — Encounter (HOSPITAL_COMMUNITY): Payer: Self-pay

## 2011-02-23 ENCOUNTER — Encounter (HOSPITAL_COMMUNITY)
Admission: RE | Admit: 2011-02-23 | Discharge: 2011-02-23 | Disposition: A | Discharge status: Home / Self Care | Payer: Managed Care, Other (non HMO) | Source: Ambulatory Visit | Attending: Surgery | Admitting: Surgery

## 2011-02-23 HISTORY — DX: Calculus of kidney: N20.0

## 2011-02-23 LAB — CBC
Platelets: 257 10*3/uL (ref 150–400)
RDW: 14 % (ref 11.5–15.5)
WBC: 8.9 10*3/uL (ref 4.0–10.5)

## 2011-02-23 LAB — SURGICAL PCR SCREEN: Staphylococcus aureus: POSITIVE — AB

## 2011-02-23 NOTE — Patient Instructions (Addendum)
20 KARI KERTH  02/23/2011   Your procedure is scheduled on 02-26-2011  Report to [facility] at 1030 AM.  Call this number if you have problems the morning of surgery: [phone]   Remember:   Do not eat food or drink liquids:After Midnight.    Take these medicines the morning of surgery with A SIP OF WATER: pantaprazole   Do not wear jewelry.  Do not wear lotions, powders, or perfumes. Do not wear deodorant.    Do not bring valuables to the hospital.  Contacts, dentures or bridgework may not be worn into surgery.  Leave suitcase in the car. After surgery it may be brought to your room.  For patients admitted to the hospital, checkout time is 11:00 AM the day of discharge.   Special Instructions: CHG Shower Use Special Wash: 1/2 bottle night before surgery and 1/2 bottle morning of surgery.neck down avoid private area   Please read over the following fact sheets that you were given: MRSA Information Cain Sieve rm [facility] pre op nurse phone number [phone] call if needed

## 2011-02-26 ENCOUNTER — Inpatient Hospital Stay (HOSPITAL_COMMUNITY)
Admission: RE | Admit: 2011-02-26 | Discharge: 2011-02-27 | DRG: 419 | Disposition: A | Discharge status: Home / Self Care | Payer: Managed Care, Other (non HMO) | Source: Ambulatory Visit | Attending: Surgery | Admitting: Surgery

## 2011-02-26 ENCOUNTER — Encounter (HOSPITAL_COMMUNITY)
Admission: RE | Disposition: A | Discharge status: Home / Self Care | Payer: Self-pay | Source: Ambulatory Visit | Attending: Surgery

## 2011-02-26 ENCOUNTER — Ambulatory Visit (HOSPITAL_COMMUNITY): Payer: Managed Care, Other (non HMO) | Admitting: Anesthesiology

## 2011-02-26 ENCOUNTER — Encounter (HOSPITAL_COMMUNITY): Payer: Self-pay | Admitting: Anesthesiology

## 2011-02-26 ENCOUNTER — Other Ambulatory Visit (INDEPENDENT_AMBULATORY_CARE_PROVIDER_SITE_OTHER): Payer: Self-pay | Admitting: Surgery

## 2011-02-26 ENCOUNTER — Ambulatory Visit (HOSPITAL_COMMUNITY): Payer: Managed Care, Other (non HMO)

## 2011-02-26 ENCOUNTER — Encounter (HOSPITAL_COMMUNITY): Payer: Self-pay | Admitting: *Deleted

## 2011-02-26 DIAGNOSIS — J45909 Unspecified asthma, uncomplicated: Secondary | ICD-10-CM | POA: Diagnosis present

## 2011-02-26 DIAGNOSIS — E785 Hyperlipidemia, unspecified: Secondary | ICD-10-CM | POA: Diagnosis present

## 2011-02-26 DIAGNOSIS — Z01812 Encounter for preprocedural laboratory examination: Secondary | ICD-10-CM

## 2011-02-26 DIAGNOSIS — K8020 Calculus of gallbladder without cholecystitis without obstruction: Secondary | ICD-10-CM

## 2011-02-26 DIAGNOSIS — Z79899 Other long term (current) drug therapy: Secondary | ICD-10-CM

## 2011-02-26 DIAGNOSIS — D509 Iron deficiency anemia, unspecified: Secondary | ICD-10-CM | POA: Diagnosis present

## 2011-02-26 DIAGNOSIS — K219 Gastro-esophageal reflux disease without esophagitis: Secondary | ICD-10-CM | POA: Diagnosis present

## 2011-02-26 DIAGNOSIS — K8010 Calculus of gallbladder with chronic cholecystitis without obstruction: Secondary | ICD-10-CM

## 2011-02-26 HISTORY — PX: CHOLECYSTECTOMY: SHX55

## 2011-02-26 SURGERY — LAPAROSCOPIC CHOLECYSTECTOMY WITH INTRAOPERATIVE CHOLANGIOGRAM
Anesthesia: General | Site: Abdomen | Wound class: Clean Contaminated

## 2011-02-26 MED ORDER — ACETAMINOPHEN 325 MG PO TABS: 650.0000 mg | ORAL_TABLET | ORAL | Status: DC | PRN

## 2011-02-26 MED ORDER — PROMETHAZINE HCL 25 MG/ML IJ SOLN: 6.2500 mg | INTRAMUSCULAR | Status: DC | PRN

## 2011-02-26 MED ORDER — KCL IN DEXTROSE-NACL 20-5-0.45 MEQ/L-%-% IV SOLN
INTRAVENOUS | Status: DC
  Administered 2011-02-26: 1000 mL via INTRAVENOUS
  Filled 2011-02-26 (×4): qty 1000

## 2011-02-26 MED ORDER — PROPOFOL 10 MG/ML IV EMUL
INTRAVENOUS | Status: DC | PRN
  Administered 2011-02-26: 220 mg via INTRAVENOUS

## 2011-02-26 MED ORDER — FENTANYL CITRATE 0.05 MG/ML IJ SOLN
25.0000 ug | INTRAMUSCULAR | Status: DC | PRN
  Administered 2011-02-26 (×2): 25 ug via INTRAVENOUS

## 2011-02-26 MED ORDER — FENTANYL CITRATE 0.05 MG/ML IJ SOLN
INTRAMUSCULAR | Status: DC | PRN
  Administered 2011-02-26: 50 ug via INTRAVENOUS
  Administered 2011-02-26: 100 ug via INTRAVENOUS
  Administered 2011-02-26 (×2): 50 ug via INTRAVENOUS

## 2011-02-26 MED ORDER — GLYCOPYRROLATE 0.2 MG/ML IJ SOLN
INTRAMUSCULAR | Status: DC | PRN
  Administered 2011-02-26: 0.3 mg via INTRAVENOUS
  Administered 2011-02-26: .8 mg via INTRAVENOUS
  Administered 2011-02-26: 0.2 mg via INTRAVENOUS

## 2011-02-26 MED ORDER — MIDAZOLAM HCL 5 MG/5ML IJ SOLN
INTRAMUSCULAR | Status: DC | PRN
  Administered 2011-02-26: 2 mg via INTRAVENOUS

## 2011-02-26 MED ORDER — IOHEXOL 300 MG/ML  SOLN
INTRAMUSCULAR | Status: DC | PRN
  Administered 2011-02-26: 8 mL

## 2011-02-26 MED ORDER — PANTOPRAZOLE SODIUM 40 MG PO TBEC
40.0000 mg | DELAYED_RELEASE_TABLET | Freq: Every day | ORAL | Status: DC
  Administered 2011-02-27: 40 mg via ORAL
  Filled 2011-02-26: qty 1

## 2011-02-26 MED ORDER — HYDROMORPHONE HCL PF 1 MG/ML IJ SOLN
INTRAMUSCULAR | Status: DC | PRN
Start: 2011-02-26 — End: 2011-02-26
  Administered 2011-02-26: 0.5 mg via INTRAVENOUS

## 2011-02-26 MED ORDER — HYDROCODONE-ACETAMINOPHEN 5-325 MG PO TABS
1.0000 | ORAL_TABLET | ORAL | Status: DC | PRN
  Administered 2011-02-27: 2 via ORAL
  Filled 2011-02-26: qty 2

## 2011-02-26 MED ORDER — ONDANSETRON HCL 4 MG/2ML IJ SOLN
INTRAMUSCULAR | Status: DC | PRN
  Administered 2011-02-26: 4 mg via INTRAVENOUS

## 2011-02-26 MED ORDER — ROCURONIUM BROMIDE 100 MG/10ML IV SOLN
INTRAVENOUS | Status: DC | PRN
  Administered 2011-02-26: 10 mg via INTRAVENOUS
  Administered 2011-02-26: 50 mg via INTRAVENOUS
  Administered 2011-02-26: 10 mg via INTRAVENOUS

## 2011-02-26 MED ORDER — CEFAZOLIN SODIUM-DEXTROSE 2-3 GM-% IV SOLR
2.0000 g | Freq: Once | INTRAVENOUS | Status: AC
  Administered 2011-02-26: 2 g via INTRAVENOUS

## 2011-02-26 MED ORDER — LACTATED RINGERS IV SOLN
INTRAVENOUS | Status: DC
  Administered 2011-02-26 (×2): 1000 mL via INTRAVENOUS
  Administered 2011-02-26: 13:00:00 via INTRAVENOUS

## 2011-02-26 MED ORDER — ACETAMINOPHEN 10 MG/ML IV SOLN
INTRAVENOUS | Status: DC | PRN
  Administered 2011-02-26: 1000 mg via INTRAVENOUS

## 2011-02-26 MED ORDER — LACTATED RINGERS IR SOLN
Status: DC | PRN
  Administered 2011-02-26: 1000 mL

## 2011-02-26 MED ORDER — MEPERIDINE HCL 50 MG/ML IJ SOLN: 6.2500 mg | INTRAMUSCULAR | Status: DC | PRN

## 2011-02-26 MED ORDER — HYDROMORPHONE HCL PF 1 MG/ML IJ SOLN
1.0000 mg | INTRAMUSCULAR | Status: DC | PRN
  Administered 2011-02-26: 1 mg via INTRAVENOUS
  Filled 2011-02-26: qty 1

## 2011-02-26 MED ORDER — EPHEDRINE SULFATE 50 MG/ML IJ SOLN
INTRAMUSCULAR | Status: DC | PRN
  Administered 2011-02-26: 10 mg via INTRAVENOUS

## 2011-02-26 MED ORDER — LACTATED RINGERS IV SOLN: INTRAVENOUS | Status: DC

## 2011-02-26 MED ORDER — BUPIVACAINE-EPINEPHRINE 0.5% -1:200000 IJ SOLN
INTRAMUSCULAR | Status: DC | PRN
  Administered 2011-02-26: 20 mL

## 2011-02-26 MED ORDER — PROMETHAZINE HCL 25 MG/ML IJ SOLN
12.5000 mg | Freq: Four times a day (QID) | INTRAMUSCULAR | Status: DC | PRN
  Administered 2011-02-26: 12.5 mg via INTRAVENOUS
  Filled 2011-02-26: qty 1

## 2011-02-26 MED ORDER — FENTANYL CITRATE 0.05 MG/ML IJ SOLN
INTRAMUSCULAR | Status: AC
  Filled 2011-02-26: qty 2

## 2011-02-26 MED ORDER — NEOSTIGMINE METHYLSULFATE 1 MG/ML IJ SOLN
INTRAMUSCULAR | Status: DC | PRN
  Administered 2011-02-26: 5 mg via INTRAVENOUS

## 2011-02-26 MED ORDER — LIDOCAINE HCL (CARDIAC) 20 MG/ML IV SOLN
INTRAVENOUS | Status: DC | PRN
  Administered 2011-02-26: 100 mg via INTRAVENOUS

## 2011-02-26 SURGICAL SUPPLY — 38 items
APL SKNCLS STERI-STRIP NONHPOA (GAUZE/BANDAGES/DRESSINGS) ×1
APPLIER CLIP ROT 10 11.4 M/L (STAPLE) ×2
APR CLP MED LRG 11.4X10 (STAPLE) ×1
BAG SPEC RTRVL LRG 6X4 10 (ENDOMECHANICALS) ×1
BENZOIN TINCTURE PRP APPL 2/3 (GAUZE/BANDAGES/DRESSINGS) ×2 IMPLANT
CABLE HIGH FREQUENCY MONO STRZ (ELECTRODE) ×2 IMPLANT
CANISTER SUCTION 2500CC (MISCELLANEOUS) ×2 IMPLANT
CHLORAPREP W/TINT 26ML (MISCELLANEOUS) ×2 IMPLANT
CLIP APPLIE ROT 10 11.4 M/L (STAPLE) ×1 IMPLANT
CLOTH BEACON ORANGE TIMEOUT ST (SAFETY) ×2 IMPLANT
COVER MAYO STAND STRL (DRAPES) ×2 IMPLANT
DECANTER SPIKE VIAL GLASS SM (MISCELLANEOUS) ×2 IMPLANT
DRAPE C-ARM 42X72 X-RAY (DRAPES) ×2 IMPLANT
DRAPE LAPAROSCOPIC ABDOMINAL (DRAPES) ×2 IMPLANT
ELECT REM PT RETURN 9FT ADLT (ELECTROSURGICAL) ×2
ELECTRODE REM PT RTRN 9FT ADLT (ELECTROSURGICAL) ×1 IMPLANT
GLOVE BIOGEL PI IND STRL 7.0 (GLOVE) ×1 IMPLANT
GLOVE BIOGEL PI INDICATOR 7.0 (GLOVE) ×1
GLOVE SURG ORTHO 8.0 STRL STRW (GLOVE) ×2 IMPLANT
GOWN STRL NON-REIN LRG LVL3 (GOWN DISPOSABLE) ×2 IMPLANT
GOWN STRL REIN XL XLG (GOWN DISPOSABLE) ×4 IMPLANT
HEMOSTAT SURGICEL 4X8 (HEMOSTASIS) IMPLANT
KIT BASIN OR (CUSTOM PROCEDURE TRAY) ×2 IMPLANT
NS IRRIG 1000ML POUR BTL (IV SOLUTION) ×2 IMPLANT
POUCH SPECIMEN RETRIEVAL 10MM (ENDOMECHANICALS) ×2 IMPLANT
SCISSORS LAP 5X35 DISP (ENDOMECHANICALS) ×1 IMPLANT
SET CHOLANGIOGRAPH MIX (MISCELLANEOUS) ×2 IMPLANT
SET IRRIG TUBING LAPAROSCOPIC (IRRIGATION / IRRIGATOR) ×2 IMPLANT
SLEEVE Z-THREAD 5X100MM (TROCAR) ×2 IMPLANT
SOLUTION ANTI FOG 6CC (MISCELLANEOUS) ×2 IMPLANT
STRIP CLOSURE SKIN 1/2X4 (GAUZE/BANDAGES/DRESSINGS) ×2 IMPLANT
SUT MNCRL AB 4-0 PS2 18 (SUTURE) ×2 IMPLANT
TOWEL OR 17X26 10 PK STRL BLUE (TOWEL DISPOSABLE) ×6 IMPLANT
TRAY LAP CHOLE (CUSTOM PROCEDURE TRAY) ×2 IMPLANT
TROCAR XCEL BLUNT TIP 100MML (ENDOMECHANICALS) ×2 IMPLANT
TROCAR Z-THREAD FIOS 11X100 BL (TROCAR) ×2 IMPLANT
TROCAR Z-THREAD FIOS 5X100MM (TROCAR) ×2 IMPLANT
TUBING INSUFFLATION 10FT LAP (TUBING) ×2 IMPLANT

## 2011-02-26 NOTE — Op Note (Signed)
NAMEMarland Kitchen  BENSON, PORCARO                ACCOUNT NO.:  [id number]  MEDICAL RECORD NO.:  [id number]  LOCATION:  1525                         FACILITY:  [facility]  PHYSICIAN:  Velora Heckler, MD      DATE OF BIRTH:  05-14-1955  DATE OF PROCEDURE:  02/26/2011                               OPERATIVE REPORT   PREOPERATIVE DIAGNOSIS:  Symptomatic cholelithiasis.  POSTOPERATIVE DIAGNOSIS:  Symptomatic cholelithiasis.  PROCEDURE:  Laparoscopic cholecystectomy with intraoperative cholangiography.  SURGEON:  Velora Heckler, MD, FACS  ANESTHESIA:  General.  ESTIMATED BLOOD LOSS:  Minimal.  PREPARATION:  ChloraPrep.  COMPLICATIONS:  None.  INDICATIONS:  The patient is a 56 year old white male, referred by his primary care physician for cholecystectomy for management of symptomatic cholelithiasis.  The patient has had 2 discrete episodes of biliary colic with abdominal pain, radiating to the back.  Ultrasound demonstrated multiple gallstones.  The patient now comes to surgery for cholecystectomy.  BODY OF REPORT:  Procedure was done in OR #1 at [facility].  The patient was brought to the operating room, placed in supine position on the operating room table.  Following administration of general anesthesia, the patient was positioned and then prepped and draped in the usual strict aseptic fashion.  After ascertaining that an adequate level of anesthesia had been achieved, an infraumbilical incision was made with a #15 blade.  The patient has a previous paramedian incision on the right due to a prior ileocecal resection in 1982 for Crohn disease.  There were adhesions to the anterior abdominal wall.  Using blunt digital dissection, the peritoneal cavity was entered.  A 0 Vicryl pursestring suture was placed in the fascia.  An Hasson cannula was introduced under direct vision and secured with a pursestring suture.  Abdomen was insufflated with carbon dioxide. Laparoscope was  introduced and the abdomen explored.  There were omental adhesions to the anterior abdominal wall.  An epigastric port was placed under direct vision.  Right subcostal ports were placed under direct vision.  Using the scope in the epigastric port and using the right subcostal ports, sharp dissection was performed to mobilize omental adhesions off the anterior abdominal wall.  This appeared to be completely omentum.  There was no bleeding.  There was no sign of bowel injury.  Adhesions were taken down allowing for exposure of the port at the level of the umbilicus and a clear view to the right upper quadrant.  Next, the fundus of the gallbladder was grasped and retracted cephalad. There were adhesions of the omentum to the entire undersurface of the gallbladder which were taken down with sharp dissection.  The duodenum was also somewhat adherent to the neck of the gallbladder and was again mobilized with sharp dissection.  Gallbladder was then grasped and retracted cephalad.  Dissection was begun at the neck of the gallbladder.  Cystic duct and cystic artery were dissected out along their length.  Clip was placed at the neck of the gallbladder.  Cystic artery was doubly clipped proximally and distally.  Using scissors, the cystic duct was incised.  A Cook cholangiography catheter was introduced through a stab wound in the right upper  quadrant and inserted into the cystic duct.  It was secured with a Ligaclip. Using C-arm fluoroscopy, real time cholangiography was performed.  There was rapid flow of contrast into the duodenum without filling defect or obstruction.  There was reflux of contrast into both the right and left hepatic ductal systems.  Clip was withdrawn and Cook catheter was removed from the peritoneal cavity.  Cystic duct was then triply clipped and divided.  Cystic artery was also divided.  Using the hook electrocautery, the gallbladder was excised from the gallbladder bed.   Good hemostasis was achieved.  Gallbladder was completely excised and placed into an EndoCatch bag.  It was withdrawn through the umbilical port without difficulty.  It was submitted to Pathology for review.  Right upper quadrant was irrigated with warm saline which was evacuated. Good hemostasis was noted.  Pneumoperitoneum was released.  Ports were removed under direct vision and good hemostasis was noted at all port sites.  A 0 Vicryl pursestring suture was tied securely. Pneumoperitoneum was completely evacuated and all ports were removed. The fascial defect at the umbilicus was closed with 2 additional 0 Vicryl sutures.  Wounds were then anesthetized with local anesthetic. All skin incisions were closed with interrupted 4-0 Monocryl subcuticular sutures.  Wounds were washed and dried and benzoin and Steri-Strips were applied.  Sterile dressings were applied.  The patient was awakened from anesthesia and brought to the recovery room.  The patient tolerated the procedure well.   Velora Heckler, MD, FACS   TMG/MEDQ  D:  02/26/2011  T:  02/26/2011  Job:  [id number]  cc:   Evelena Peat, M.D.

## 2011-02-26 NOTE — H&P (View-Only) (Signed)
Chief Complaint  Patient presents with  . Cholelithiasis    evaluate for cholecystectomy - referral by Dr. Bruce Burchette, [place] at [facility]   HISTORY: Patient is a 56-year-old white male referred by his primary care physician for consideration for cholecystectomy for management of symptomatic cholelithiasis. Patient has had 2 discrete episodes of epigastric abdominal pain radiating to the back. Both of these occurred at night following fatty food meals. One episode was associated with sweats. Patient denies any fever. He denies any history of jaundice or acholic stools. He denies any previous episodes.  There is no family history of hepatobiliary disease. Patient has had prior abdominal surgery with an ileocecal resection in 1982 for Crohn's disease. He is not under any active treatment for inflammatory bowel disease at this time.  Past Medical History  Diagnosis Date  . HYPERLIPIDEMIA 07/22/2009  . ANEMIA, IRON DEFICIENCY 07/22/2009  . OTITIS MEDIA, SUPPURATIVE 11/09/2009  . ASTHMA 07/22/2009  . GERD 07/22/2009  . PREDIABETES 07/22/2009  . NONSPECIFIC ABNORM RESULTS KIDNEY FUNCTION STUDY 07/22/2009     Current Outpatient Prescriptions  Medication Sig Dispense Refill  . pantoprazole (PROTONIX) 40 MG tablet Take 1 tablet (40 mg total) by mouth daily.  90 tablet  1  . simvastatin (ZOCOR) 40 MG tablet Take 1 tablet (40 mg total) by mouth at bedtime.  90 tablet  1     No Known Allergies   Family History  Problem Relation Age of Onset  . Cancer Father     colon  . Hypertension Maternal Grandfather   . Stroke Maternal Grandfather      History   Social History  . Marital Status: Married    Spouse Name: N/A    Number of Children: N/A  . Years of Education: N/A   Social History Main Topics  . Smoking status: Never Smoker   . Smokeless tobacco: Never Used  . Alcohol Use: Yes     occasional glass of wine  . Drug Use: No  . Sexually Active: None   Other Topics Concern  .  None   Social History Narrative  . None     REVIEW OF SYSTEMS - PERTINENT POSITIVES ONLY: 2 episodes of epigastric abdominal pain as noted. Denies jaundice. Denies acholic stools. Denies fever. Denies nausea and emesis.  EXAM: Filed Vitals:   02/22/11 1451  BP: 136/88  Pulse: 68  Temp: 98.3 F (36.8 C)  Resp: 18    HEENT: normocephalic; pupils equal and reactive; sclerae clear; dentition good; mucous membranes moist NECK:  symmetric on extension; no palpable anterior or posterior cervical lymphadenopathy; no supraclavicular masses; no tenderness CHEST: clear to auscultation bilaterally without rales, rhonchi, or wheezes CARDIAC: regular rate and rhythm without significant murmur; peripheral pulses are full ABDOMEN: soft without distension; bowel sounds present; no mass; no hepatosplenomegaly; no hernia;  Well healed right paramedian incision EXT:  non-tender without edema; no deformity NEURO: no gross focal deficits; no sign of tremor   LABORATORY RESULTS: See [facility] (CHL-Epic) for most recent results   RADIOLOGY RESULTS: See [facility] (CHL-Epic) for most recent results   IMPRESSION: Symptomatic cholelithiasis, intermittent biliary colic  PLAN: The patient and I and his wife discussed all of the above findings at length. I believe he has intermittent episodes of biliary colic due to cholelithiasis. We discussed the indications for cholecystectomy. We discussed the technique of laparoscopic cholecystectomy with intraoperative cholangiography. We discussed the potential for conversion to open surgery area and we discussed the hospital stay to   be anticipated and his recovery following the procedure. He understands and wishes to proceed in the near future. We will make arrangements for his surgery at a time convenient for him.  The risks and benefits of the procedure have been discussed at length with the patient.  The patient understands the proposed procedure,  potential alternative treatments, and the course of recovery to be expected.  All of the patient's questions have been answered at this time.  The patient wishes to proceed with surgery and will schedule a date for their procedure through our office staff.   Nanda Bittick M. Quanita Barona, MD, FACS General & Endocrine Surgery [facility]   Visit Diagnoses: 1. Gallstones   2. Abdominal pain, right upper quadrant     Primary Care Physician: BURCHETTE,BRUCE W, MD, MD   

## 2011-02-26 NOTE — Brief Op Note (Signed)
02/26/2011  2:07 PM  PATIENT:  Eric Duran  56 y.o. male  PRE-OPERATIVE DIAGNOSIS:  symptomtic gallstones  POST-OPERATIVE DIAGNOSIS:  symptomatic gallstones  PROCEDURE:  Procedure(s): LAPAROSCOPIC CHOLECYSTECTOMY WITH INTRAOPERATIVE CHOLANGIOGRAM  SURGEON:  Surgeon(s): Velora Heckler, MD, FACS  ASSISTANTS: none   ANESTHESIA:   general  EBL:  Total I/O In: 1000 [I.V.:1000] Out: -   BLOOD ADMINISTERED:none  DRAINS: none   LOCAL MEDICATIONS USED:  MARCAINE 20 CC  SPECIMEN:  Excision  DISPOSITION OF SPECIMEN:  PATHOLOGY  COUNTS:  YES  DICTATION: .Other Dictation: Dictation Number [id number]  PLAN OF CARE: Admit for overnight observation  PATIENT DISPOSITION:  PACU - hemodynamically stable.   Velora Heckler, MD, FACS General & Endocrine Surgery [facility]

## 2011-02-26 NOTE — Anesthesia Preprocedure Evaluation (Signed)
Anesthesia Evaluation  Patient identified by MRN, date of birth, ID band Patient awake    Reviewed: Allergy & Precautions, H&P , NPO status , Patient's Chart, lab work & pertinent test results  Airway Mallampati: II TM Distance: >3 FB Neck ROM: Full    Dental No notable dental hx.    Pulmonary neg pulmonary ROS,  clear to auscultation  Pulmonary exam normal       Cardiovascular neg cardio ROS Regular Normal    Neuro/Psych Negative Neurological ROS  Negative Psych ROS   GI/Hepatic negative GI ROS, Neg liver ROS,   Endo/Other  Negative Endocrine ROS  Renal/GU negative Renal ROS  Genitourinary negative   Musculoskeletal negative musculoskeletal ROS (+)   Abdominal   Peds negative pediatric ROS (+)  Hematology negative hematology ROS (+)   Anesthesia Other Findings   Reproductive/Obstetrics negative OB ROS                           Anesthesia Physical Anesthesia Plan  ASA: II  Anesthesia Plan: General   Post-op Pain Management:    Induction: Intravenous  Airway Management Planned: Oral ETT  Additional Equipment:   Intra-op Plan:   Post-operative Plan: Extubation in OR  Informed Consent: I have reviewed the patients History and Physical, chart, labs and discussed the procedure including the risks, benefits and alternatives for the proposed anesthesia with the patient or authorized representative who has indicated his/her understanding and acceptance.   Dental advisory given  Plan Discussed with: CRNA  Anesthesia Plan Comments:         Anesthesia Quick Evaluation  

## 2011-02-26 NOTE — Transfer of Care (Signed)
Immediate Anesthesia Transfer of Care Note  Patient: Eric Duran  Procedure(s) Performed:  LAPAROSCOPIC CHOLECYSTECTOMY WITH INTRAOPERATIVE CHOLANGIOGRAM  Patient Location: PACU  Anesthesia Type: General  Level of Consciousness: awake, alert , oriented and patient cooperative  Airway & Oxygen Therapy: Patient Spontanous Breathing and Patient connected to face mask oxygen  Post-op Assessment: Report given to PACU RN, Post -op Vital signs reviewed and stable, Patient moving all extremities X 4 and Patient able to stick tongue midline  Post vital signs: Reviewed and stable  Complications: No apparent anesthesia complications

## 2011-02-26 NOTE — Anesthesia Postprocedure Evaluation (Signed)
  Anesthesia Post-op Note  Patient: Eric Duran  Procedure(s) Performed:  LAPAROSCOPIC CHOLECYSTECTOMY WITH INTRAOPERATIVE CHOLANGIOGRAM  Patient Location: PACU  Anesthesia Type: General  Level of Consciousness: awake and alert   Airway and Oxygen Therapy: Patient Spontanous Breathing  Post-op Pain: mild  Post-op Assessment: Post-op Vital signs reviewed, Patient's Cardiovascular Status Stable, Respiratory Function Stable, Patent Airway and No signs of Nausea or vomiting  Post-op Vital Signs: stable  Complications: No apparent anesthesia complications

## 2011-02-26 NOTE — Interval H&P Note (Signed)
History and Physical Interval Note:  02/26/2011 12:34 PM  Eric Duran  has presented today for surgery, with the diagnosis of symptomtic gallstones  The various methods of treatment have been discussed with the patient and family. After consideration of risks, benefits and other options for treatment, the patient has consented to    Procedure(s): LAPAROSCOPIC CHOLECYSTECTOMY WITH INTRAOPERATIVE CHOLANGIOGRAM as a surgical intervention .    The patients' history has been reviewed, patient examined, no change in status, stable for surgery.  I have reviewed the patients' chart and labs.  Questions were answered to the patient's satisfaction.    Velora Heckler, MD, FACS General & Endocrine Surgery [facility]  Renn Dirocco Judie Petit

## 2011-02-27 ENCOUNTER — Encounter (HOSPITAL_COMMUNITY): Payer: Self-pay | Admitting: Anesthesiology

## 2011-02-27 MED ORDER — HYDROCODONE-ACETAMINOPHEN 5-325 MG PO TABS: 1.0000 | ORAL_TABLET | ORAL | Status: AC | PRN

## 2011-02-27 NOTE — Discharge Summary (Signed)
  Physician Discharge Summary [facility]  Patient ID: Eric Duran MRN: [id number] DOB/AGE: 58-Jun-1957 58 y.o.  Admit date: 02/26/2011 Discharge date: 02/27/2011  Admission Diagnoses:  Symptomatic cholelithiasis  Discharge Diagnoses:  Active Problems:  * No active hospital problems. *    Discharged Condition: good  Hospital Course: Patient admitted after lap cholecystectomy.  One episode of nausea and emesis.  Patient could not void and Foley catheter placed by nursing.  Tolerated liquid breakfast.  Prepared for discharge home.  Consults: None  Significant Diagnostic Studies: none  Treatments: Lap Chole with IOC; Foley catheter placement  Discharge Exam: Blood pressure 143/82, pulse 75, temperature 97.9 F (36.6 C), temperature source Oral, resp. rate 18, height 6\' 3"  (1.905 m), weight 220 lb 14.4 oz (100.2 kg), SpO2 95.00%. HEENT - clear Chest - clear Cor - RRR Abd - soft without distension; dressing dry GU - Foley with clear urine  Disposition: Home with family  Discharge Orders    Future Appointments: Provider: Department: Dept Phone: Center:   03/05/2011 9:30 AM Velora Heckler, MD [facility] [phone] None     Medication List  As of 02/27/2011  9:34 AM   ASK your doctor about these medications         aspirin EC 81 MG tablet   Take 81 mg by mouth daily.      FISH OIL PO   Take 1 capsule by mouth 2 (two) times daily.      mulitivitamin with minerals Tabs   Take 1 tablet by mouth daily.      pantoprazole 40 MG tablet   Commonly known as: PROTONIX   Take 40 mg by mouth daily before breakfast.      simvastatin 40 MG tablet   Commonly known as: ZOCOR   Take 40 mg by mouth every evening.      VITAMIN D PO   Take 1 tablet by mouth daily.           Velora Heckler, MD, FACS General & Endocrine Surgery [facility]   Signed: Velora Heckler 02/27/2011, 9:34 AM

## 2011-02-28 ENCOUNTER — Encounter (HOSPITAL_COMMUNITY): Payer: Self-pay | Admitting: Surgery

## 2011-02-28 MED FILL — Mupirocin Oint 2%: CUTANEOUS | Qty: 22 | Status: AC

## 2011-03-01 ENCOUNTER — Telehealth (INDEPENDENT_AMBULATORY_CARE_PROVIDER_SITE_OTHER): Payer: Self-pay

## 2011-03-01 ENCOUNTER — Encounter (INDEPENDENT_AMBULATORY_CARE_PROVIDER_SITE_OTHER): Payer: Managed Care, Other (non HMO) | Admitting: Surgery

## 2011-03-01 NOTE — Telephone Encounter (Signed)
I spoke with the patient this morning and he is stated he is voiding without any problems. The foley has been removed.   Patient advised to call the office if any problems should occur.  He has an post op appointment for Monday 03/05/2011.

## 2011-03-05 ENCOUNTER — Encounter (INDEPENDENT_AMBULATORY_CARE_PROVIDER_SITE_OTHER): Payer: Self-pay | Admitting: Surgery

## 2011-03-05 ENCOUNTER — Ambulatory Visit (INDEPENDENT_AMBULATORY_CARE_PROVIDER_SITE_OTHER): Payer: Managed Care, Other (non HMO) | Admitting: Surgery

## 2011-03-05 VITALS — BP 126/80 | HR 68 | Temp 97.6°F | Resp 18 | Ht 75.0 in | Wt 217.2 lb

## 2011-03-05 DIAGNOSIS — K8020 Calculus of gallbladder without cholecystitis without obstruction: Secondary | ICD-10-CM

## 2011-03-05 NOTE — Patient Instructions (Signed)
  COCOA BUTTER & VITAMIN E CREAM  ([employer] or other brand)  Apply cocoa butter/vitamin E cream to your incision 2 - 3 times daily.  Massage cream into incision for one minute with each application.  Use sunscreen (50 SPF or higher) for first 6 months after surgery if area is exposed to sun.  You may substitute Mederma or other scar reducing creams as desired.   

## 2011-03-05 NOTE — Progress Notes (Signed)
Visit Diagnoses: 1. Gallstones     HISTORY: Patient is a 56 year old white male who underwent laparoscopic cholecystectomy for symptomatic cholelithiasis. Intraoperative cholangiography was negative. Final pathology shows chronic cholecystitis and cholelithiasis.  EXAM: Abdomen is soft without distention. Surgical wounds are healing nicely. Steri-Strips remain in place. Right upper quadrant is soft nontender without mass.  IMPRESSION: Chronic cholecystitis and cholelithiasis, biliary colic  PLAN: Patient is released to resume normal activities. He will refrain from any strenuous lifting. He will apply topical creams to his incisions. He will return to see me in this office as needed.  Velora Heckler, MD, FACS General & Endocrine Surgery [facility]

## 2011-03-19 ENCOUNTER — Other Ambulatory Visit: Payer: Self-pay | Admitting: *Deleted

## 2011-03-19 MED ORDER — SIMVASTATIN 40 MG PO TABS: 40.0000 mg | ORAL_TABLET | Freq: Every evening | ORAL | Status: DC

## 2011-03-19 MED ORDER — PANTOPRAZOLE SODIUM 40 MG PO TBEC: 40.0000 mg | DELAYED_RELEASE_TABLET | Freq: Every day | ORAL | Status: DC

## 2011-03-26 ENCOUNTER — Other Ambulatory Visit: Payer: Self-pay | Admitting: *Deleted

## 2011-03-26 MED ORDER — SIMVASTATIN 40 MG PO TABS: 40.0000 mg | ORAL_TABLET | Freq: Every evening | ORAL | Status: DC

## 2011-03-26 MED ORDER — PANTOPRAZOLE SODIUM 40 MG PO TBEC: 40.0000 mg | DELAYED_RELEASE_TABLET | Freq: Every day | ORAL | Status: DC

## 2011-05-14 ENCOUNTER — Encounter: Payer: Self-pay | Admitting: Family Medicine

## 2011-05-14 ENCOUNTER — Ambulatory Visit (INDEPENDENT_AMBULATORY_CARE_PROVIDER_SITE_OTHER): Payer: BC Managed Care – PPO | Admitting: Family Medicine

## 2011-05-14 VITALS — BP 130/80 | Temp 98.1°F | Wt 216.0 lb

## 2011-05-14 DIAGNOSIS — K644 Residual hemorrhoidal skin tags: Secondary | ICD-10-CM

## 2011-05-14 NOTE — Progress Notes (Signed)
  Subjective:    Patient ID: Eric Duran, male    DOB: 18-Dec-1955, 56 y.o.   MRN: [id number]  HPI  Acute visit. Swollen area near anus noted last week. He had GI bug with vomiting and diarrhea. Those symptoms have resolved. Denies any bloody stools. No recent constipation. Only minimal discomfort. He has had 2 previous colonoscopies. No history of hemorrhoids. No pain with sitting. No aggravating or alleviating factors.   Review of Systems  Constitutional: Negative for appetite change and unexpected weight change.  Gastrointestinal: Negative for abdominal pain and blood in stool.  Hematological: Negative for adenopathy.       Objective:   Physical Exam  Constitutional: He appears well-developed and well-nourished.  Cardiovascular: Normal rate and regular rhythm.   Pulmonary/Chest: Effort normal and breath sounds normal.  Genitourinary:       Patient has fairly large nonthrombosed external hemorrhoid around 7:00 position. Nontender. No active bleeding          Assessment & Plan:  External hemorrhoid. Measures to reduce constipation discussed. Try symptomatic relief with warm sitz baths. Consider over-the-counter topical for symptom relief as needed. No evidence for thrombosis

## 2011-05-14 NOTE — Patient Instructions (Signed)

## 2011-10-18 ENCOUNTER — Other Ambulatory Visit: Payer: Self-pay | Admitting: *Deleted

## 2011-10-18 MED ORDER — PANTOPRAZOLE SODIUM 40 MG PO TBEC: 40.0000 mg | DELAYED_RELEASE_TABLET | Freq: Every day | ORAL | Status: DC

## 2011-10-23 ENCOUNTER — Other Ambulatory Visit: Payer: Self-pay

## 2011-10-23 MED ORDER — PANTOPRAZOLE SODIUM 40 MG PO TBEC: 40.0000 mg | DELAYED_RELEASE_TABLET | Freq: Every day | ORAL | Status: DC

## 2012-04-15 ENCOUNTER — Telehealth: Payer: Self-pay | Admitting: Family Medicine

## 2012-04-15 MED ORDER — PANTOPRAZOLE SODIUM 40 MG PO TBEC: 40.0000 mg | DELAYED_RELEASE_TABLET | Freq: Every day | ORAL | Status: DC

## 2012-04-15 NOTE — Telephone Encounter (Signed)
Pt informed he needs OV, last seen jan 2013.  #30 protonix sent to [facility]

## 2012-04-15 NOTE — Telephone Encounter (Signed)
Pt needs new generic protonix 40 mg #90 with 3 refills. Pt will pick up rx

## 2012-05-05 ENCOUNTER — Encounter: Payer: Self-pay | Admitting: Family Medicine

## 2012-05-05 ENCOUNTER — Ambulatory Visit (INDEPENDENT_AMBULATORY_CARE_PROVIDER_SITE_OTHER): Payer: BC Managed Care – PPO | Admitting: Family Medicine

## 2012-05-05 VITALS — BP 130/92 | HR 72 | Temp 97.9°F | Resp 12 | Ht 74.25 in | Wt 221.0 lb

## 2012-05-05 DIAGNOSIS — Z0000 Encounter for general adult medical examination without abnormal findings: Secondary | ICD-10-CM

## 2012-05-05 DIAGNOSIS — Z23 Encounter for immunization: Secondary | ICD-10-CM

## 2012-05-05 LAB — CBC WITH DIFFERENTIAL/PLATELET
Basophils Absolute: 0 10*3/uL (ref 0.0–0.1)
Eosinophils Absolute: 0.1 10*3/uL (ref 0.0–0.7)
Eosinophils Relative: 2.4 % (ref 0.0–5.0)
MCHC: 34.1 g/dL (ref 30.0–36.0)
MCV: 84 fl (ref 78.0–100.0)
Monocytes Absolute: 0.4 10*3/uL (ref 0.1–1.0)
Neutrophils Relative %: 57.2 % (ref 43.0–77.0)
Platelets: 189 10*3/uL (ref 150.0–400.0)
RDW: 14.5 % (ref 11.5–14.6)
WBC: 5.2 10*3/uL (ref 4.5–10.5)

## 2012-05-05 LAB — BASIC METABOLIC PANEL
CO2: 29 mEq/L (ref 19–32)
Calcium: 9.6 mg/dL (ref 8.4–10.5)
Sodium: 138 mEq/L (ref 135–145)

## 2012-05-05 LAB — POCT URINALYSIS DIPSTICK
Bilirubin, UA: NEGATIVE
Ketones, UA: NEGATIVE
Leukocytes, UA: NEGATIVE
Nitrite, UA: NEGATIVE

## 2012-05-05 LAB — HEPATIC FUNCTION PANEL
Albumin: 4.1 g/dL (ref 3.5–5.2)
Total Bilirubin: 0.8 mg/dL (ref 0.3–1.2)

## 2012-05-05 LAB — LIPID PANEL
Cholesterol: 256 mg/dL — ABNORMAL HIGH (ref 0–200)
HDL: 48.3 mg/dL (ref >39.00)
Triglycerides: 114 mg/dL (ref 0.0–149.0)

## 2012-05-05 LAB — TSH: TSH: 4.65 u[IU]/mL (ref 0.35–5.50)

## 2012-05-05 LAB — VITAMIN B12: Vitamin B-12: 435 pg/mL (ref 211–911)

## 2012-05-05 LAB — LDL CHOLESTEROL, DIRECT: Direct LDL: 173.8 mg/dL

## 2012-05-05 NOTE — Progress Notes (Signed)
Subjective:    Patient ID: Eric Duran, male    DOB: [birth year], 57 y.o.   MRN: [id number]  HPI Patient is here for complete physical Past medical history significant for mild intermittent asthma, history of GERD, and recurrent kidney stones. Patient currently only takes pantoprazole 40 mg daily. He stopped simvastatin several months ago not because of any side effects but because of questionable benefit. He has no history of peripheral vascular disease or cardiac history.  Colonoscopy 2011 normal. Nonsmoker. No consistent exercise.  Past Medical History  Diagnosis Date  . HYPERLIPIDEMIA 07/22/2009  . OTITIS MEDIA, SUPPURATIVE 11/09/2009  . GERD 07/22/2009  . PREDIABETES 07/22/2009    pt does not monitor cbg at home  . ASTHMA 07/22/2009    mild  . ANEMIA, IRON DEFICIENCY 07/22/2009  . Kidney stones 2001    6 times in past   Past Surgical History  Procedure Laterality Date  . Biopsy bowel  1982    exploration bowel surgery, chrons disease  . Tonsillectomy  1960 - approx  . Hernia repair  2005 - approx  . Colonscopy  2012 and 2006  . Cholecystectomy  02/26/2011    Procedure: LAPAROSCOPIC CHOLECYSTECTOMY WITH INTRAOPERATIVE CHOLANGIOGRAM;  Surgeon: Velora Heckler, MD;  Location: [facility];  Service: General;  Laterality: N/A;    reports that he has never smoked. He has never used smokeless tobacco. He reports that  drinks alcohol. He reports that he does not use illicit drugs. family history includes Cancer in his father; Hypertension in his maternal grandfather; and Stroke in his maternal grandfather. No Known Allergies And   Review of Systems  Constitutional: Negative for fever, activity change, appetite change and fatigue.  HENT: Negative for ear pain, congestion and trouble swallowing.   Eyes: Negative for pain and visual disturbance.  Respiratory: Negative for cough, shortness of breath and wheezing.   Cardiovascular: Negative for chest pain and palpitations.  Gastrointestinal:  Negative for nausea, vomiting, abdominal pain, diarrhea, constipation, blood in stool, abdominal distention and rectal pain.  Genitourinary: Negative for dysuria, hematuria and testicular pain.  Musculoskeletal: Negative for joint swelling and arthralgias.  Skin: Negative for rash.  Neurological: Negative for dizziness, syncope and headaches.  Hematological: Negative for adenopathy.  Psychiatric/Behavioral: Negative for confusion and dysphoric mood.       Objective:   Physical Exam  Constitutional: He is oriented to person, place, and time. He appears well-developed and well-nourished. No distress.  HENT:  Head: Normocephalic and atraumatic.  Right Ear: External ear normal.  Left Ear: External ear normal.  Mouth/Throat: Oropharynx is clear and moist.  Eyes: Conjunctivae and EOM are normal. Pupils are equal, round, and reactive to light.  Neck: Normal range of motion. Neck supple. No thyromegaly present.  Cardiovascular: Normal rate, regular rhythm and normal heart sounds.   No murmur heard. Pulmonary/Chest: No respiratory distress. He has no wheezes. He has no rales.  Abdominal: Soft. Bowel sounds are normal. He exhibits no distension and no mass. There is no tenderness. There is no rebound and no guarding.  Genitourinary: Rectum normal and prostate normal.  Musculoskeletal: He exhibits no edema.  Lymphadenopathy:    He has no cervical adenopathy.  Neurological: He is alert and oriented to person, place, and time. He displays normal reflexes. No cranial nerve deficit.  Skin: No rash noted.  Scattered angiomas. No concerning lesions  Psychiatric: He has a normal mood and affect.          Assessment & Plan:  Complete physical. Obtain screening lab work. Add B12 because of chronic PPI use and also history of Crohn's disease and increased risk for B12 deficiency. Tetanus booster given. Colonoscopy up to date. Monitor blood pressure closely next couple months. Work on weight loss  and establishing consistent aerobic exercise

## 2012-05-05 NOTE — Addendum Note (Signed)
Addended by: Melchor Amour on: 05/05/2012 10:13 AM   Modules accepted: Orders

## 2012-05-07 NOTE — Progress Notes (Signed)
Quick Note:  Pt wife informed, emergency contact, pt is traveling today. ______

## 2012-05-09 ENCOUNTER — Encounter: Payer: Self-pay | Admitting: Family Medicine

## 2012-05-09 MED ORDER — SIMVASTATIN 40 MG PO TABS: 40.0000 mg | ORAL_TABLET | Freq: Every evening | ORAL | Status: DC

## 2012-05-09 MED ORDER — PANTOPRAZOLE SODIUM 40 MG PO TBEC: 40.0000 mg | DELAYED_RELEASE_TABLET | Freq: Every day | ORAL | Status: DC

## 2012-11-17 ENCOUNTER — Other Ambulatory Visit: Payer: Self-pay | Admitting: Family Medicine

## 2012-12-04 ENCOUNTER — Other Ambulatory Visit: Payer: Self-pay

## 2013-12-07 ENCOUNTER — Encounter: Payer: Self-pay | Admitting: Family Medicine

## 2013-12-07 ENCOUNTER — Ambulatory Visit (INDEPENDENT_AMBULATORY_CARE_PROVIDER_SITE_OTHER): Payer: BC Managed Care – PPO | Admitting: Family Medicine

## 2013-12-07 ENCOUNTER — Encounter: Payer: Self-pay | Admitting: *Deleted

## 2013-12-07 VITALS — BP 130/90 | HR 60 | Temp 97.6°F | Wt 211.0 lb

## 2013-12-07 DIAGNOSIS — M79671 Pain in right foot: Secondary | ICD-10-CM

## 2013-12-07 DIAGNOSIS — R202 Paresthesia of skin: Secondary | ICD-10-CM

## 2013-12-07 MED ORDER — PANTOPRAZOLE SODIUM 40 MG PO TBEC: 40.0000 mg | DELAYED_RELEASE_TABLET | Freq: Every day | ORAL | Status: DC

## 2013-12-07 MED ORDER — SIMVASTATIN 40 MG PO TABS: ORAL_TABLET | ORAL | Status: DC

## 2013-12-07 NOTE — Progress Notes (Signed)
   Subjective:    Patient ID: Eric Duran, male    DOB: 07/02/1955, 58 y.o.   MRN: [id number]  HPI 58 year old male presents with right foot pain intermittent for 1 year.  He began running again recently and gets a "tight" sensation across the dorsal aspect of his foot along MTP joints.  Tightness radiates from big toe across other digits and sometimes to the lateral malleolus.  Worse at night and in the morning.  Denies injury.  Denies edema, erythema or fevers.  Occassionally feels numb.  No weakness.  No issues with gait.  Does not get worse with running.  Denies sharp pains.      Review of Systems  Constitutional: Negative for fever, activity change and fatigue.  Respiratory: Negative for cough, chest tightness and shortness of breath.   Cardiovascular: Negative for chest pain and leg swelling.  Gastrointestinal: Negative for nausea and vomiting.  Musculoskeletal: Positive for arthralgias. Negative for myalgias, back pain, joint swelling and gait problem.  Skin: Negative for color change and wound.  Neurological: Positive for numbness.       Objective:   Physical Exam  Constitutional: He is oriented to person, place, and time. He appears well-developed and well-nourished. No distress.  Cardiovascular: Normal rate, regular rhythm, normal heart sounds and intact distal pulses.   No murmur heard. Pulmonary/Chest: Effort normal and breath sounds normal. No respiratory distress. He has no wheezes.  Musculoskeletal: He exhibits no edema or tenderness.  Normal ROM in the ankle.  Decreased flexion of right big toe due to hypertrophy of MTP joint.  No hypertrophic changes in any other joint.  Normal strength bilaterally.    Neurological: He is alert and oriented to person, place, and time. He has normal reflexes.  Skin: Skin is warm and dry. No rash noted. He is not diaphoretic. No erythema. No pallor.  Nursing note and vitals reviewed.         Assessment & Plan:  1.  Mild  hypertrophic changes 1st MTP- likely osteoarthritis.  No suspicion of gout.  Order x-ray right foot today.  Continue Aleve as needed.  2.  Dysesthesia MTP joints- Continue to watch for now.  Patient would like to see if things worsen.  No signs of weakness.  No injuries to the back.  Possible referral at a later date to podiatry.      Luiz OchoaMelissa Fantini, PA-S  As above.   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  He has some mild paresthesias across dorsum of foot which we do not suspect are related.  No focal weakness.  Start with x-ray of foot.  Consider podiatry referral if pain worsens or limits activities.  Bruce Burchette M.D.

## 2013-12-07 NOTE — Progress Notes (Signed)
Pre visit review using our clinic review tool, if applicable. No additional management support is needed unless otherwise documented below in the visit note. 

## 2013-12-20 ENCOUNTER — Other Ambulatory Visit: Payer: Self-pay | Admitting: Family Medicine

## 2014-12-27 ENCOUNTER — Other Ambulatory Visit: Payer: Self-pay | Admitting: Family Medicine

## 2015-03-08 ENCOUNTER — Encounter: Payer: Self-pay | Admitting: Family Medicine

## 2015-03-08 ENCOUNTER — Ambulatory Visit (INDEPENDENT_AMBULATORY_CARE_PROVIDER_SITE_OTHER): Payer: PRIVATE HEALTH INSURANCE | Admitting: Family Medicine

## 2015-03-08 VITALS — BP 132/82 | HR 74 | Temp 97.9°F | Ht 74.25 in | Wt 212.1 lb

## 2015-03-08 DIAGNOSIS — Z23 Encounter for immunization: Secondary | ICD-10-CM | POA: Diagnosis not present

## 2015-03-08 DIAGNOSIS — J069 Acute upper respiratory infection, unspecified: Secondary | ICD-10-CM

## 2015-03-08 DIAGNOSIS — H6592 Unspecified nonsuppurative otitis media, left ear: Secondary | ICD-10-CM

## 2015-03-08 DIAGNOSIS — H6980 Other specified disorders of Eustachian tube, unspecified ear: Secondary | ICD-10-CM

## 2015-03-08 NOTE — Progress Notes (Signed)
HPI:  Ear fullness/sinus pressure: -started: started about 6 days ago -symptoms:sinus pressure, both ears feel full and has some popping in ears from time to time, occ pain in ears -denies:fever, SOB, NVD, tooth pain, sinus pain -has tried: nothing -Hx of: allergies but reports no issues with these lately, hx of ear infection  ROS: See pertinent positives and negatives per HPI.  Past Medical History  Diagnosis Date  . HYPERLIPIDEMIA 07/22/2009  . OTITIS MEDIA, SUPPURATIVE 11/09/2009  . GERD 07/22/2009  . PREDIABETES 07/22/2009    pt does not monitor cbg at home  . ASTHMA 07/22/2009    mild  . ANEMIA, IRON DEFICIENCY 07/22/2009  . Kidney stones 2001    6 times in past    Past Surgical History  Procedure Laterality Date  . Biopsy bowel  1982    exploration bowel surgery, chrons disease  . Tonsillectomy  1960 - approx  . Hernia repair  2005 - approx  . Colonscopy  2012 and 2006  . Cholecystectomy  02/26/2011    Procedure: LAPAROSCOPIC CHOLECYSTECTOMY WITH INTRAOPERATIVE CHOLANGIOGRAM;  Surgeon: Velora Heckler, MD;  Location: [facility];  Service: General;  Laterality: N/A;    Family History  Problem Relation Age of Onset  . Cancer Father     colon  . Hypertension Maternal Grandfather   . Stroke Maternal Grandfather     Social History   Social History  . Marital Status: Married    Spouse Name: N/A  . Number of Children: N/A  . Years of Education: N/A   Social History Main Topics  . Smoking status: Never Smoker   . Smokeless tobacco: Never Used  . Alcohol Use: Yes     Comment: occasional glass of wine  . Drug Use: No  . Sexual Activity: Not Asked   Other Topics Concern  . None   Social History Narrative     Current outpatient prescriptions:  .  aspirin EC 81 MG tablet, Take 81 mg by mouth daily., Disp: , Rfl:  .  Cholecalciferol (VITAMIN D PO), Take 1 tablet by mouth daily., Disp: , Rfl:  .  Omega-3 Fatty Acids (FISH OIL PO), Take 1 capsule by mouth 2 (two)  times daily., Disp: , Rfl:  .  RaNITidine HCl (ZANTAC PO), Take by mouth., Disp: , Rfl:  .  simvastatin (ZOCOR) 40 MG tablet, TAKE 1 TABLET (40 MG TOTAL) BY MOUTH EVERY EVENING., Disp: 90 tablet, Rfl: 2  EXAM:  Filed Vitals:   03/08/15 1544  BP: 132/82  Pulse: 74  Temp: 97.9 F (36.6 C)    Body mass index is 27.05 kg/(m^2).  GENERAL: vitals reviewed and listed above, alert, oriented, appears well hydrated and in no acute distress  HEENT: atraumatic, conjunttiva clear, no obvious abnormalities on inspection of external nose and ears, normal appearance of ear canals and TMs except for clear effusion on L without bulging, redness, retractions or purulent fluid, clear nasal congestion L > R, mild post oropharyngeal erythema with PND, no tonsillar edema or exudate, no sinus TTP  NECK: no obvious masses on inspection  LUNGS: clear to auscultation bilaterally, no wheezes, rales or rhonchi, good air movement  CV: HRRR, no peripheral edema  MS: moves all extremities without noticeable abnormality  PSYCH: pleasant and cooperative, no obvious depression or anxiety  ASSESSMENT AND PLAN:  Discussed the following assessment and plan:  Acute upper respiratory infection  Eustachian tube dysfunction, unspecified laterality  Middle ear effusion, left  -given HPI and exam  findings today, a serious infection or illness is unlikely. We discussed potential etiologies, with VURI being most likely with ETD and effusion. No signs of bacterial infection currently. We discussed treatment side effects, likely course, antibiotic misuse, transmission, and signs of developing a serious illness. Opted for short course nasal decongestant and INS. -of course, we advised to return or notify a doctor immediately if symptoms worsen or persist or new concerns arise.    Patient Instructions  Afrin nasal spray (available over the counter) twice daily for 3 days then STOP  Flonase (available over the counter)  2 sprays each nostril for 21 days  Follow up if worsening, new concerns or symptoms persist      KIM, HANNAH R.

## 2015-03-08 NOTE — Patient Instructions (Signed)
Afrin nasal spray (available over the counter) twice daily for 3 days then STOP  Flonase (available over the counter) 2 sprays each nostril for 21 days  Follow up if worsening, new concerns or symptoms persist

## 2015-03-08 NOTE — Progress Notes (Signed)
Pre visit review using our clinic review tool, if applicable. No additional management support is needed unless otherwise documented below in the visit note. 

## 2015-03-22 ENCOUNTER — Encounter: Payer: Self-pay | Admitting: Internal Medicine

## 2015-03-22 ENCOUNTER — Ambulatory Visit (INDEPENDENT_AMBULATORY_CARE_PROVIDER_SITE_OTHER): Payer: PRIVATE HEALTH INSURANCE | Admitting: Internal Medicine

## 2015-03-22 VITALS — BP 146/90 | HR 56 | Temp 97.6°F | Resp 20 | Ht 74.25 in | Wt 202.0 lb

## 2015-03-22 DIAGNOSIS — J069 Acute upper respiratory infection, unspecified: Secondary | ICD-10-CM

## 2015-03-22 DIAGNOSIS — E785 Hyperlipidemia, unspecified: Secondary | ICD-10-CM

## 2015-03-22 DIAGNOSIS — B9789 Other viral agents as the cause of diseases classified elsewhere: Secondary | ICD-10-CM

## 2015-03-22 MED ORDER — SIMVASTATIN 40 MG PO TABS: 40.0000 mg | ORAL_TABLET | Freq: Every day | ORAL | Status: DC

## 2015-03-22 MED ORDER — PREDNISONE 10 MG PO TABS: ORAL_TABLET | ORAL | Status: DC

## 2015-03-22 NOTE — Progress Notes (Signed)
Pre visit review using our clinic review tool, if applicable. No additional management support is needed unless otherwise documented below in the visit note. 

## 2015-03-22 NOTE — Patient Instructions (Signed)
Prednisone Dosepak as directed  Schedule CPX

## 2015-03-22 NOTE — Progress Notes (Signed)
Subjective:    Patient ID: Eric Duran, male    DOB: Aug 09, 1955, 60 y.o.   MRN: [id number]  HPI 60 year old patient who has a history of impaired glucose tolerance, mild asthma and dyslipidemia. He was seen 2 weeks ago with some sinus congestion and drainage.  He states grandchildren had been diagnosed with a flu syndrome and confirmed influenza A.  He had some initial fever.  At the present time.  He complains of sinus pressure, cough and congestion.  Symptoms seem to wax and wane.  Early in the illness, he was treated with short-term Afrin and fluticasone nasal spray. History of complaint is a pruritic rash involving his back area.  This has been present for 3 or 4 days  Past Medical History  Diagnosis Date  . HYPERLIPIDEMIA 07/22/2009  . OTITIS MEDIA, SUPPURATIVE 11/09/2009  . GERD 07/22/2009  . PREDIABETES 07/22/2009    pt does not monitor cbg at home  . ASTHMA 07/22/2009    mild  . ANEMIA, IRON DEFICIENCY 07/22/2009  . Kidney stones 2001    6 times in past    Social History   Social History  . Marital Status: Married    Spouse Name: N/A  . Number of Children: N/A  . Years of Education: N/A   Occupational History  . Not on file.   Social History Main Topics  . Smoking status: Never Smoker   . Smokeless tobacco: Never Used  . Alcohol Use: Yes     Comment: occasional glass of wine  . Drug Use: No  . Sexual Activity: Not on file   Other Topics Concern  . Not on file   Social History Narrative    Past Surgical History  Procedure Laterality Date  . Biopsy bowel  1982    exploration bowel surgery, chrons disease  . Tonsillectomy  1960 - approx  . Hernia repair  2005 - approx  . Colonscopy  2012 and 2006  . Cholecystectomy  02/26/2011    Procedure: LAPAROSCOPIC CHOLECYSTECTOMY WITH INTRAOPERATIVE CHOLANGIOGRAM;  Surgeon: Velora Heckler, MD;  Location: [facility];  Service: General;  Laterality: N/A;    Family History  Problem Relation Age of Onset  . Cancer Father       colon  . Hypertension Maternal Grandfather   . Stroke Maternal Grandfather     No Known Allergies  Current Outpatient Prescriptions on File Prior to Visit  Medication Sig Dispense Refill  . Cholecalciferol (VITAMIN D PO) Take 1 tablet by mouth daily.    . Omega-3 Fatty Acids (FISH OIL PO) Take 1 capsule by mouth 2 (two) times daily.    . RaNITidine HCl (ZANTAC PO) Take by mouth.    . simvastatin (ZOCOR) 40 MG tablet TAKE 1 TABLET (40 MG TOTAL) BY MOUTH EVERY EVENING. 90 tablet 2   No current facility-administered medications on file prior to visit.    BP 146/90 mmHg  Pulse 56  Temp(Src) 97.6 F (36.4 C) (Oral)  Resp 20  Ht 6' 2.25" (1.886 m)  Wt 202 lb (91.627 kg)  BMI 25.76 kg/m2  SpO2 97%      Review of Systems  Constitutional: Negative for fever, chills, appetite change and fatigue.  HENT: Positive for congestion, postnasal drip and sore throat. Negative for dental problem, ear pain, hearing loss, tinnitus, trouble swallowing and voice change.   Eyes: Negative for pain, discharge and visual disturbance.  Respiratory: Negative for cough, chest tightness, wheezing and stridor.   Cardiovascular: Negative for  chest pain, palpitations and leg swelling.  Gastrointestinal: Negative for nausea, vomiting, abdominal pain, diarrhea, constipation, blood in stool and abdominal distention.  Genitourinary: Negative for urgency, hematuria, flank pain, discharge, difficulty urinating and genital sores.  Musculoskeletal: Negative for myalgias, back pain, joint swelling, arthralgias, gait problem and neck stiffness.  Skin: Positive for rash.  Neurological: Negative for dizziness, syncope, speech difficulty, weakness, numbness and headaches.  Hematological: Negative for adenopathy. Does not bruise/bleed easily.  Psychiatric/Behavioral: Negative for behavioral problems and dysphoric mood. The patient is not nervous/anxious.        Objective:   Physical Exam  Constitutional: He is  oriented to person, place, and time. He appears well-developed.  HENT:  Head: Normocephalic.  Right Ear: External ear normal.  Left Ear: External ear normal.  Eyes: Conjunctivae and EOM are normal.  Neck: Normal range of motion.  Cardiovascular: Normal rate and normal heart sounds.   Pulmonary/Chest: Breath sounds normal.  Abdominal: Bowel sounds are normal.  Musculoskeletal: Normal range of motion. He exhibits no edema or tenderness.  Neurological: He is alert and oriented to person, place, and time.  Skin: Rash noted.  Maculopapular rash over the Lower back and flank areas bilaterally  Psychiatric: He has a normal mood and affect. His behavior is normal.          Assessment & Plan:   Resolving URI Nonspecific dermatitis History of asthma  Will treat with a prednisone Dosepak  Schedule CPX with PCP

## 2015-03-29 ENCOUNTER — Encounter: Payer: Self-pay | Admitting: Family Medicine

## 2015-03-30 ENCOUNTER — Telehealth: Payer: Self-pay | Admitting: Family Medicine

## 2015-03-30 DIAGNOSIS — Z0000 Encounter for general adult medical examination without abnormal findings: Secondary | ICD-10-CM

## 2015-03-30 NOTE — Telephone Encounter (Signed)
Pt requesting order for labs for CPE.  Will put in future order and he will schedule.

## 2015-03-30 NOTE — Telephone Encounter (Signed)
Please advise 

## 2015-04-19 ENCOUNTER — Other Ambulatory Visit (INDEPENDENT_AMBULATORY_CARE_PROVIDER_SITE_OTHER): Payer: PRIVATE HEALTH INSURANCE

## 2015-04-19 ENCOUNTER — Encounter: Payer: Self-pay | Admitting: Family Medicine

## 2015-04-19 DIAGNOSIS — Z0000 Encounter for general adult medical examination without abnormal findings: Secondary | ICD-10-CM | POA: Diagnosis not present

## 2015-04-19 LAB — LIPID PANEL
Cholesterol: 161 mg/dL (ref 0–200)
HDL: 69 mg/dL (ref >39.00)
LDL Cholesterol: 83 mg/dL (ref 0–99)
NONHDL: 92.41
Total CHOL/HDL Ratio: 2
Triglycerides: 49 mg/dL (ref 0.0–149.0)
VLDL: 9.8 mg/dL (ref 0.0–40.0)

## 2015-04-19 LAB — CBC WITH DIFFERENTIAL/PLATELET
BASOS PCT: 0.6 % (ref 0.0–3.0)
Basophils Absolute: 0 10*3/uL (ref 0.0–0.1)
EOS PCT: 2.4 % (ref 0.0–5.0)
Eosinophils Absolute: 0.1 10*3/uL (ref 0.0–0.7)
HCT: 40.4 % (ref 39.0–52.0)
Hemoglobin: 13.9 g/dL (ref 13.0–17.0)
LYMPHS ABS: 1.5 10*3/uL (ref 0.7–4.0)
Lymphocytes Relative: 30.7 % (ref 12.0–46.0)
MCHC: 34.3 g/dL (ref 30.0–36.0)
MCV: 87.7 fl (ref 78.0–100.0)
MONO ABS: 0.5 10*3/uL (ref 0.1–1.0)
Monocytes Relative: 9.6 % (ref 3.0–12.0)
NEUTROS ABS: 2.7 10*3/uL (ref 1.4–7.7)
NEUTROS PCT: 56.7 % (ref 43.0–77.0)
PLATELETS: 171 10*3/uL (ref 150.0–400.0)
RBC: 4.61 Mil/uL (ref 4.22–5.81)
RDW: 14.3 % (ref 11.5–15.5)
WBC: 4.8 10*3/uL (ref 4.0–10.5)

## 2015-04-19 LAB — BASIC METABOLIC PANEL
BUN: 15 mg/dL (ref 6–23)
CO2: 30 mEq/L (ref 19–32)
Calcium: 9.3 mg/dL (ref 8.4–10.5)
Chloride: 100 mEq/L (ref 96–112)
Creatinine, Ser: 1.11 mg/dL (ref 0.40–1.50)
GFR: 71.43 mL/min (ref >60.00)
GLUCOSE: 107 mg/dL — AB (ref 70–99)
POTASSIUM: 3.9 meq/L (ref 3.5–5.1)
SODIUM: 136 meq/L (ref 135–145)

## 2015-04-19 LAB — HEPATIC FUNCTION PANEL
ALBUMIN: 4.3 g/dL (ref 3.5–5.2)
ALT: 28 U/L (ref 0–53)
AST: 21 U/L (ref 0–37)
Alkaline Phosphatase: 52 U/L (ref 39–117)
Bilirubin, Direct: 0.3 mg/dL (ref 0.0–0.3)
Total Bilirubin: 1.2 mg/dL (ref 0.2–1.2)
Total Protein: 6.5 g/dL (ref 6.0–8.3)

## 2015-04-19 LAB — POCT URINALYSIS DIPSTICK
Bilirubin, UA: NEGATIVE
Glucose, UA: NEGATIVE
Ketones, UA: NEGATIVE
Leukocytes, UA: NEGATIVE
NITRITE UA: NEGATIVE
PH UA: 6
Protein, UA: NEGATIVE
RBC UA: NEGATIVE
SPEC GRAV UA: 1.015
UROBILINOGEN UA: 0.2

## 2015-04-19 LAB — TSH: TSH: 4.36 u[IU]/mL (ref 0.35–4.50)

## 2015-04-19 LAB — PSA: PSA: 0.49 ng/mL (ref 0.10–4.00)

## 2015-04-21 ENCOUNTER — Telehealth: Payer: Self-pay | Admitting: Family Medicine

## 2015-04-21 MED ORDER — PRAVASTATIN SODIUM 40 MG PO TABS: 40.0000 mg | ORAL_TABLET | Freq: Every day | ORAL | Status: DC

## 2015-04-21 NOTE — Telephone Encounter (Signed)
Pt with mild hyperglycemia.  Requesting change from Simvastatin.  Will change to Pravastatin 40 mg daily (though I explained this might be somewhat of a class effect).

## 2015-04-28 NOTE — Telephone Encounter (Signed)
Dr. Caryl NeverBurchette, okay for pt to finish Simvastatin before starting Pravastatin?

## 2016-07-17 ENCOUNTER — Other Ambulatory Visit: Payer: Self-pay | Admitting: Family Medicine

## 2016-08-13 ENCOUNTER — Encounter: Payer: Self-pay | Admitting: Family Medicine

## 2016-08-13 ENCOUNTER — Ambulatory Visit (INDEPENDENT_AMBULATORY_CARE_PROVIDER_SITE_OTHER): Payer: PRIVATE HEALTH INSURANCE | Admitting: Family Medicine

## 2016-08-13 VITALS — BP 192/108 | HR 80 | Temp 98.1°F | Ht 74.25 in | Wt 203.0 lb

## 2016-08-13 DIAGNOSIS — R002 Palpitations: Secondary | ICD-10-CM | POA: Diagnosis not present

## 2016-08-13 DIAGNOSIS — I491 Atrial premature depolarization: Secondary | ICD-10-CM | POA: Diagnosis not present

## 2016-08-13 DIAGNOSIS — R030 Elevated blood-pressure reading, without diagnosis of hypertension: Secondary | ICD-10-CM | POA: Diagnosis not present

## 2016-08-13 DIAGNOSIS — E785 Hyperlipidemia, unspecified: Secondary | ICD-10-CM

## 2016-08-13 LAB — BASIC METABOLIC PANEL
BUN: 21 mg/dL (ref 6–23)
CALCIUM: 10 mg/dL (ref 8.4–10.5)
CO2: 31 mEq/L (ref 19–32)
Chloride: 98 mEq/L (ref 96–112)
Creatinine, Ser: 1.21 mg/dL (ref 0.40–1.50)
GFR: 64.39 mL/min (ref >60.00)
GLUCOSE: 90 mg/dL (ref 70–99)
Potassium: 4.7 mEq/L (ref 3.5–5.1)
Sodium: 138 mEq/L (ref 135–145)

## 2016-08-13 LAB — HEPATIC FUNCTION PANEL
ALBUMIN: 4.8 g/dL (ref 3.5–5.2)
ALK PHOS: 51 U/L (ref 39–117)
ALT: 32 U/L (ref 0–53)
AST: 24 U/L (ref 0–37)
Bilirubin, Direct: 0.3 mg/dL (ref 0.0–0.3)
TOTAL PROTEIN: 7.1 g/dL (ref 6.0–8.3)
Total Bilirubin: 1.4 mg/dL — ABNORMAL HIGH (ref 0.2–1.2)

## 2016-08-13 LAB — LIPID PANEL
Cholesterol: 191 mg/dL (ref 0–200)
HDL: 75.7 mg/dL (ref >39.00)
LDL Cholesterol: 104 mg/dL — ABNORMAL HIGH (ref 0–99)
NonHDL: 115.08
TRIGLYCERIDES: 55 mg/dL (ref 0.0–149.0)
Total CHOL/HDL Ratio: 3
VLDL: 11 mg/dL (ref 0.0–40.0)

## 2016-08-13 LAB — TSH: TSH: 3.99 u[IU]/mL (ref 0.35–4.50)

## 2016-08-13 NOTE — Patient Instructions (Signed)
Premature Atrial Contraction A premature atrial contraction (PAC) is a kind of irregular heartbeat (arrhythmia). It happens when the heart beats too early and then pauses before beating again. PACs are also called skipped heartbeats because they may make you feel like your heart is stopping for a second, even though the heart does not actually skip a beat. The heart has four areas, or chambers. Normally, electrical signals spread across the heart and make all the chambers beat together. During a PAC, the upper chambers of the heart (right atrium and left atrium) beat too early, before they have had time to fill with blood. The heartbeat pauses afterward so the heart can fill with blood for the next beat. What are the causes? The cause of this condition is often unknown. Sometimes it is caused by heart disease or injury to the heart. What increases the risk? This condition is more likely to develop in adults who are 50 years of age or older and in children. Episodes may be triggered by:  Caffeine.  Stress.  Tiredness.  Alcohol.  Smoking.  Stimulant drugs.  Heart disease.  What are the signs or symptoms? Symptoms of this condition include:  A feeling that your heart skipped a beat. The first heartbeat after the "skipped" beat may feel more forceful.  A feeling that your heart is fluttering.  How is this diagnosed? This condition is diagnosed based on:  Your symptoms.  A physical exam. Your health care provider may listen to your heart.  Tests to rule out other conditions, such as a test that records the electrical impulses of the heart and assesses heart health (electrocardiogram, or ECG). If you have an ECG, you may need to wear a portable ECG machine (Holter monitor) that records your heart beats for 24 hours or more.  How is this treated?  Usually, treatment is not needed for this condition. If you have episodes that happen often or if a cause is found, you may receive  treatment for the underlying cause of your PACs. Follow these instructions at home: Lifestyle Follow these instructions as told by your health care provider:  Do not use any products that contain nicotine or tobacco, such as cigarettes and e-cigarettes. If you need help quitting, ask your health care provider.  If caffeine triggers episodes, do not eat, drink, or use anything with caffeine in it.  If caffeine does not seem to trigger episodes, consume caffeine in moderation.  If alcohol triggers episodes of PAC, do not drink alcohol.  If alcohol does not seem to trigger episodes, limit alcohol intake to no more than 1 drink a day for nonpregnant women and 2 drinks a day for men. One drink equals 12 oz of beer, 5 oz of wine, or 1 oz of hard liquor.  Exercise regularly. Ask your health care provider what type of exercise is safe for you.  Find healthy ways to manage stress. Avoid stressful situations when possible.  Try to get at least 7-9 hours of sleep each night, or as much as recommended by your health care provider.  Do not use illegal drugs.  General instructions  Take over-the-counter and prescription medicines only as told by your health care provider.  Keep all follow-up visits as told by your health care provider. This is important. Contact a health care provider if:  You feel your heart skipping beats more than once a day.  Your heart skips beats and you feel dizzy, light-headed, or very tired. Get help right away   if:  You have chest pain.  You have trouble breathing. This information is not intended to replace advice given to you by your health care provider. Make sure you discuss any questions you have with your health care provider. Document Released: 09/18/2013 Document Revised: 09/13/2015 Document Reviewed: 07/15/2015 Elsevier Interactive Patient Education  2017 Elsevier Inc.  

## 2016-08-13 NOTE — Progress Notes (Signed)
Subjective:     Patient ID: Eric Duran, male   DOB: 21-Jun-1955, 61 y.o.   MRN: [id number]  HPI Patient has chronic problems including history of Crohn's disease followed by GI as well as dyslipidemia. He's had minimally elevated blood sugars the past. He remains on pravastatin. He seen today with intermittent episodes of mild palpitations.  He is describing episodes that sound almost more anxiety related where he has about 5 minutes of feeling somewhat sweaty lightheaded and very anxious. The first time this occurred was and is getting his haircut. Since then he had episodes at home and in various places. No history of panic attack.  He stays very active and in fact had been running about 6 miles a day up until July. He's been busy recently with caring for grandchildren but had no difficulty whatsoever running 6 miles per day on his treadmill at home. No syncope. No dyspnea.  Drinks about 2 cups of coffee per day. No regular alcohol use. Nonsmoker. Hyperlipidemia treated with pravastatin. Overdue for labs.  Patient states his wife took his blood pressure at home earlier today and they initially obtained a very high reading in this was around 170s systolic and then repeat 123 systolic. No history of hypertension. No headaches.  Past Medical History:  Diagnosis Date  . ANEMIA, IRON DEFICIENCY 07/22/2009  . ASTHMA 07/22/2009   mild  . GERD 07/22/2009  . HYPERLIPIDEMIA 07/22/2009  . Kidney stones 2001   6 times in past  . OTITIS MEDIA, SUPPURATIVE 11/09/2009  . PREDIABETES 07/22/2009   pt does not monitor cbg at home   Past Surgical History:  Procedure Laterality Date  . BIOPSY BOWEL  1982   exploration bowel surgery, chrons disease  . CHOLECYSTECTOMY  02/26/2011   Procedure: LAPAROSCOPIC CHOLECYSTECTOMY WITH INTRAOPERATIVE CHOLANGIOGRAM;  Surgeon: Velora Heckler, MD;  Location: [facility];  Service: General;  Laterality: N/A;  . colonscopy  2012 and 2006  . HERNIA REPAIR  2005 - approx  .  TONSILLECTOMY  1960 - approx    reports that he has never smoked. He has never used smokeless tobacco. He reports that he drinks alcohol. He reports that he does not use drugs. family history includes Cancer in his father; Hypertension in his maternal grandfather; Stroke in his maternal grandfather. No Known Allergies   Review of Systems  Constitutional: Positive for fatigue. Negative for appetite change, chills, fever and unexpected weight change.  Respiratory: Negative for cough and shortness of breath.   Cardiovascular: Positive for palpitations. Negative for chest pain and leg swelling.  Gastrointestinal: Negative for abdominal pain.  Endocrine: Negative for polydipsia and polyuria.  Genitourinary: Negative for dysuria.  Neurological: Negative for weakness.  Hematological: Negative for adenopathy.  Psychiatric/Behavioral: Negative for dysphoric mood. The patient is nervous/anxious.        Objective:   Physical Exam  Constitutional: He appears well-developed and well-nourished.  Neck: Neck supple. No thyromegaly present.  Cardiovascular: Normal rate and regular rhythm.   Pulmonary/Chest: Effort normal and breath sounds normal. No respiratory distress. He has no wheezes. He has no rales.  Musculoskeletal: He exhibits no edema.  Lymphadenopathy:    He has no cervical adenopathy.       Assessment:     #1 intermittent mild palpitations. EKG today shows premature atrial contractions. No acute changes. He's never had any exertional symptoms and no red flags such as presyncope or syncope.  #2 transient questionable anxiety symptoms  #3 hyperlipidemia  #3 elevated blood pressure  with no history of hypertension    Plan:     -Check labs with lipid panel, hepatic panel, basic metabolic panel, TSH -EKG as above shows some PACs -Minimize caffeine intake -Monitor blood pressure at home and bring in his cuff and readings at follow-up in one week  Kristian CoveyBruce W Mileydi Milsap MD [place]  [facility]

## 2016-08-20 ENCOUNTER — Ambulatory Visit (INDEPENDENT_AMBULATORY_CARE_PROVIDER_SITE_OTHER): Payer: PRIVATE HEALTH INSURANCE | Admitting: Family Medicine

## 2016-08-20 ENCOUNTER — Encounter: Payer: Self-pay | Admitting: Family Medicine

## 2016-08-20 DIAGNOSIS — I10 Essential (primary) hypertension: Secondary | ICD-10-CM | POA: Diagnosis not present

## 2016-08-20 MED ORDER — PRAVASTATIN SODIUM 40 MG PO TABS: 40.0000 mg | ORAL_TABLET | Freq: Every day | ORAL | 3 refills | Status: DC

## 2016-08-20 MED ORDER — LOSARTAN POTASSIUM 50 MG PO TABS: 50.0000 mg | ORAL_TABLET | Freq: Every day | ORAL | 3 refills | Status: DC

## 2016-08-20 NOTE — Progress Notes (Signed)
Subjective:     Patient ID: Eric HoppingRoger E Demonte, male   DOB: 06/28/1955, 61 y.o.   MRN: [id number]  HPI Patient seen for follow-up regarding recent palpitations and elevated blood pressure. Never treated for hypertension. Does have family history of hypertension. Had some low-grade dull headaches which he thinks may correlate with elevated blood pressure. Brings in his cuff today has had multiple elevated readings recently. He had recent EKG which showed some premature atrial contractions. No chest pains. No exercise intolerance.  Past Medical History:  Diagnosis Date  . ANEMIA, IRON DEFICIENCY 07/22/2009  . ASTHMA 07/22/2009   mild  . GERD 07/22/2009  . HYPERLIPIDEMIA 07/22/2009  . Kidney stones 2001   6 times in past  . OTITIS MEDIA, SUPPURATIVE 11/09/2009  . PREDIABETES 07/22/2009   pt does not monitor cbg at home   Past Surgical History:  Procedure Laterality Date  . BIOPSY BOWEL  1982   exploration bowel surgery, chrons disease  . CHOLECYSTECTOMY  02/26/2011   Procedure: LAPAROSCOPIC CHOLECYSTECTOMY WITH INTRAOPERATIVE CHOLANGIOGRAM;  Surgeon: Velora Hecklerodd M Gerkin, MD;  Location: [facility];  Service: General;  Laterality: N/A;  . colonscopy  2012 and 2006  . HERNIA REPAIR  2005 - approx  . TONSILLECTOMY  1960 - approx    reports that he has never smoked. He has never used smokeless tobacco. He reports that he drinks alcohol. He reports that he does not use drugs. family history includes Cancer in his father; Hypertension in his maternal grandfather; Stroke in his maternal grandfather. No Known Allergies   Review of Systems  Constitutional: Negative for fatigue.  Eyes: Negative for visual disturbance.  Respiratory: Negative for cough, chest tightness and shortness of breath.   Cardiovascular: Negative for chest pain, palpitations and leg swelling.  Neurological: Positive for headaches. Negative for dizziness, syncope, weakness and light-headedness.       Objective:   Physical Exam   Constitutional: He is oriented to person, place, and time. He appears well-developed and well-nourished.  HENT:  Right Ear: External ear normal.  Left Ear: External ear normal.  Mouth/Throat: Oropharynx is clear and moist.  Eyes: Pupils are equal, round, and reactive to light.  Neck: Neck supple. No thyromegaly present.  Cardiovascular: Normal rate and regular rhythm.   Pulmonary/Chest: Effort normal and breath sounds normal. No respiratory distress. He has no wheezes. He has no rales.  Musculoskeletal: He exhibits no edema.  Neurological: He is alert and oriented to person, place, and time.       Assessment:     Hypertension. Currently untreated    Plan:     -start losartan 50 mg once daily -Continue to monitor blood pressure at home -Get back to more consistent regular aerobic exercise  Kristian CoveyBruce W Tyrika Newman MD [place] [facility]

## 2016-09-18 ENCOUNTER — Ambulatory Visit (INDEPENDENT_AMBULATORY_CARE_PROVIDER_SITE_OTHER): Payer: PRIVATE HEALTH INSURANCE | Admitting: Family Medicine

## 2016-09-18 ENCOUNTER — Encounter: Payer: Self-pay | Admitting: Family Medicine

## 2016-09-18 VITALS — BP 138/70 | HR 57 | Temp 97.8°F | Wt 208.0 lb

## 2016-09-18 DIAGNOSIS — I10 Essential (primary) hypertension: Secondary | ICD-10-CM

## 2016-09-18 DIAGNOSIS — Z1159 Encounter for screening for other viral diseases: Secondary | ICD-10-CM | POA: Diagnosis not present

## 2016-09-18 NOTE — Patient Instructions (Addendum)
Get back to regular exercise and build up slowly Try to lose a few pounds Lets plan on 3 month follow up.   Check on coverage for new shingles vaccine -Shingrix.

## 2016-09-18 NOTE — Progress Notes (Signed)
Subjective:     Patient ID: Eric Duran, male   DOB: 1955/02/02, 61 y.o.   MRN: [id number]  HPI Patient seen for follow-up hypertension. We recently started losartan 50 mg once daily. Feels somewhat better overall. Has not started back any exercise yet. He denies any chest pains other than occasional fleeting palpitations at rest. No dizziness. He plans to start back exercises soon. No side effects from medication. Compliant with medications.  Patient has not had hepatitis C screening. Low risk.  Past Medical History:  Diagnosis Date  . ANEMIA, IRON DEFICIENCY 07/22/2009  . ASTHMA 07/22/2009   mild  . GERD 07/22/2009  . HYPERLIPIDEMIA 07/22/2009  . Kidney stones 2001   6 times in past  . OTITIS MEDIA, SUPPURATIVE 11/09/2009  . PREDIABETES 07/22/2009   pt does not monitor cbg at home   Past Surgical History:  Procedure Laterality Date  . BIOPSY BOWEL  1982   exploration bowel surgery, chrons disease  . CHOLECYSTECTOMY  02/26/2011   Procedure: LAPAROSCOPIC CHOLECYSTECTOMY WITH INTRAOPERATIVE CHOLANGIOGRAM;  Surgeon: Velora Heckler, MD;  Location: [facility];  Service: General;  Laterality: N/A;  . colonscopy  2012 and 2006  . HERNIA REPAIR  2005 - approx  . TONSILLECTOMY  1960 - approx    reports that he has never smoked. He has never used smokeless tobacco. He reports that he drinks alcohol. He reports that he does not use drugs. family history includes Cancer in his father; Hypertension in his maternal grandfather; Stroke in his maternal grandfather. No Known Allergies   Review of Systems  Constitutional: Negative for fatigue.  Eyes: Negative for visual disturbance.  Respiratory: Negative for cough, chest tightness and shortness of breath.   Cardiovascular: Negative for chest pain, palpitations and leg swelling.  Neurological: Negative for dizziness, syncope, weakness, light-headedness and headaches.       Objective:   Physical Exam  Constitutional: He is oriented to person,  place, and time. He appears well-developed and well-nourished.  HENT:  Right Ear: External ear normal.  Left Ear: External ear normal.  Mouth/Throat: Oropharynx is clear and moist.  Eyes: Pupils are equal, round, and reactive to light.  Neck: Neck supple. No thyromegaly present.  Cardiovascular: Normal rate and regular rhythm.   Pulmonary/Chest: Effort normal and breath sounds normal. No respiratory distress. He has no wheezes. He has no rales.  Musculoskeletal: He exhibits no edema.  Neurological: He is alert and oriented to person, place, and time.       Assessment:     Hypertension. Initial reading here today 148/90 but repeat left arm seated after rest 138/70 which is improved    Plan:     -Check hepatitis C antibody -Continue losartan 50 mg daily -Start back regular aerobic exercise and build up slowly -Reassess in 3 months. Our goal is consistent blood pressure control less than 130/80.  Titrate losartan to 100 mg at that point if not further to goal  Kristian Covey MD [place] [facility] at [facility]

## 2016-09-19 LAB — HEPATITIS C ANTIBODY: HCV Ab: NONREACTIVE

## 2016-10-18 ENCOUNTER — Encounter: Payer: Self-pay | Admitting: Family Medicine

## 2016-10-30 ENCOUNTER — Encounter: Payer: Self-pay | Admitting: Family Medicine

## 2016-11-07 ENCOUNTER — Telehealth: Payer: Self-pay | Admitting: Family Medicine

## 2016-11-07 NOTE — Telephone Encounter (Signed)
Eric Duran,  [name] is the patient I mentioned who requested Shingrix and was told he could not initiate secondary to shortage.  He was trying to get vaccines in before end of year.  His wife had received hers recently.   Maybe at least a call to pt to explain our predicament with the shortage would help.  Thanks!  Dr Leonard Schwartz

## 2016-11-13 ENCOUNTER — Ambulatory Visit (INDEPENDENT_AMBULATORY_CARE_PROVIDER_SITE_OTHER): Payer: PRIVATE HEALTH INSURANCE | Admitting: *Deleted

## 2016-11-13 DIAGNOSIS — Z23 Encounter for immunization: Secondary | ICD-10-CM

## 2016-11-19 NOTE — Telephone Encounter (Signed)
Called patient and left message to return call. Labeled 2 doses of Shingrix for patient in FP pod fridge drawer.  Ok to schedule patient for 1st Shingrix injection now and second injection 2 months from first injection.

## 2016-12-10 ENCOUNTER — Encounter: Payer: Self-pay | Admitting: Family Medicine

## 2016-12-19 ENCOUNTER — Ambulatory Visit (INDEPENDENT_AMBULATORY_CARE_PROVIDER_SITE_OTHER): Payer: PRIVATE HEALTH INSURANCE | Admitting: Family Medicine

## 2016-12-19 ENCOUNTER — Encounter: Payer: Self-pay | Admitting: Family Medicine

## 2016-12-19 VITALS — BP 118/74 | HR 85 | Temp 97.8°F | Wt 208.5 lb

## 2016-12-19 DIAGNOSIS — I10 Essential (primary) hypertension: Secondary | ICD-10-CM

## 2016-12-19 NOTE — Progress Notes (Signed)
Subjective:     Patient ID: Eric Duran, male   DOB: 02/10/1955, 61 y.o.   MRN: [id number]  HPI   Patient seen for follow-up hypertension. We started losartan 50 mg daily and blood pressures at home have been ranging mostly 130s systolic and around 70s to 80s diastolic. He's been exercising fairly regularly. No headaches. No dizziness. Denies any side effects from losartan. Compliant with therapy.  Past Medical History:  Diagnosis Date  . ANEMIA, IRON DEFICIENCY 07/22/2009  . ASTHMA 07/22/2009   mild  . GERD 07/22/2009  . HYPERLIPIDEMIA 07/22/2009  . Kidney stones 2001   6 times in past  . OTITIS MEDIA, SUPPURATIVE 11/09/2009  . PREDIABETES 07/22/2009   pt does not monitor cbg at home   Past Surgical History:  Procedure Laterality Date  . BIOPSY BOWEL  1982   exploration bowel surgery, chrons disease  . CHOLECYSTECTOMY  02/26/2011   Procedure: LAPAROSCOPIC CHOLECYSTECTOMY WITH INTRAOPERATIVE CHOLANGIOGRAM;  Surgeon: Velora Hecklerodd M Gerkin, MD;  Location: [facility];  Service: General;  Laterality: N/A;  . colonscopy  2012 and 2006  . HERNIA REPAIR  2005 - approx  . TONSILLECTOMY  1960 - approx    reports that  has never smoked. he has never used smokeless tobacco. He reports that he drinks alcohol. He reports that he does not use drugs. family history includes Cancer in his father; Hypertension in his maternal grandfather; Stroke in his maternal grandfather. No Known Allergies   Review of Systems  Constitutional: Negative for fatigue.  Eyes: Negative for visual disturbance.  Respiratory: Negative for cough, chest tightness and shortness of breath.   Cardiovascular: Negative for chest pain, palpitations and leg swelling.  Endocrine: Negative for polydipsia and polyuria.  Neurological: Negative for dizziness, syncope, weakness, light-headedness and headaches.       Objective:   Physical Exam  Constitutional: He appears well-developed and well-nourished.  Neck: Neck supple. No thyromegaly  present.  Cardiovascular: Normal rate and regular rhythm.  Pulmonary/Chest: Effort normal and breath sounds normal. No respiratory distress. He has no wheezes. He has no rales.  Musculoskeletal: He exhibits no edema.       Assessment:     Hypertension- improved with good reading here today.    Plan:     -Continue regular exercise -Continue to monitor blood pressure and be in touch if consistent readings greater than 130/80  Eric CoveyBruce W Burchette MD [place] [facility]

## 2016-12-24 ENCOUNTER — Ambulatory Visit (INDEPENDENT_AMBULATORY_CARE_PROVIDER_SITE_OTHER): Payer: PRIVATE HEALTH INSURANCE | Admitting: *Deleted

## 2016-12-24 DIAGNOSIS — Z23 Encounter for immunization: Secondary | ICD-10-CM

## 2016-12-24 NOTE — Progress Notes (Signed)
Per orders of Dr. Caryl NeverBurchette, injection of Shingrix vaccine given by Starla Linkarolyn J Takila Kronberg. Patient tolerated injection well.  Patient will call to schedule second dose 2-6 months from now.  Starla Linkarolyn J Orhan Mayorga, RN

## 2017-03-12 ENCOUNTER — Ambulatory Visit (INDEPENDENT_AMBULATORY_CARE_PROVIDER_SITE_OTHER): Payer: PRIVATE HEALTH INSURANCE

## 2017-03-12 DIAGNOSIS — Z23 Encounter for immunization: Secondary | ICD-10-CM | POA: Diagnosis not present

## 2017-03-12 NOTE — Progress Notes (Signed)
Per orders of Dr. Burchette, injection of Shingrix given by Patricia G Jaimes. Patient tolerated injection well. 

## 2017-05-10 NOTE — Telephone Encounter (Signed)
This encounter was created in error - please disregard.

## 2017-06-16 ENCOUNTER — Encounter: Payer: Self-pay | Admitting: Family Medicine

## 2017-08-05 ENCOUNTER — Other Ambulatory Visit: Payer: Self-pay | Admitting: Family Medicine

## 2017-09-09 ENCOUNTER — Ambulatory Visit (INDEPENDENT_AMBULATORY_CARE_PROVIDER_SITE_OTHER): Payer: PRIVATE HEALTH INSURANCE | Admitting: Family Medicine

## 2017-09-09 ENCOUNTER — Ambulatory Visit (INDEPENDENT_AMBULATORY_CARE_PROVIDER_SITE_OTHER): Payer: PRIVATE HEALTH INSURANCE

## 2017-09-09 ENCOUNTER — Encounter: Payer: Self-pay | Admitting: Family Medicine

## 2017-09-09 VITALS — BP 120/70 | HR 61 | Temp 97.6°F | Wt 213.4 lb

## 2017-09-09 DIAGNOSIS — R200 Anesthesia of skin: Secondary | ICD-10-CM

## 2017-09-09 DIAGNOSIS — M545 Low back pain, unspecified: Secondary | ICD-10-CM

## 2017-09-09 NOTE — Progress Notes (Signed)
  Subjective:     Patient ID: Eric HoppingRoger E Greeson, male   DOB: 09/18/1955, 62 y.o.   MRN: [id number]  HPI Patient seen with lower lumbar pain bilaterally. No recent injury. He started doing exercise regimen of some light weights along with his running back in April. He describes this as more of a dull pain.  Pain radiates into the buttock and occasionally elevated down toward the foot bilaterally. He has been able still do some walking and running but recently is becoming more symptomatic with activity. No urine or stool incontinence. Occasional right foot numbness. Pain severity 7 out of 10 is worst  Sometimes relieved with sitting an back flexed.  Past Medical History:  Diagnosis Date  . ANEMIA, IRON DEFICIENCY 07/22/2009  . ASTHMA 07/22/2009   mild  . GERD 07/22/2009  . HYPERLIPIDEMIA 07/22/2009  . Kidney stones 2001   6 times in past  . OTITIS MEDIA, SUPPURATIVE 11/09/2009  . PREDIABETES 07/22/2009   pt does not monitor cbg at home   Past Surgical History:  Procedure Laterality Date  . BIOPSY BOWEL  1982   exploration bowel surgery, chrons disease  . CHOLECYSTECTOMY  02/26/2011   Procedure: LAPAROSCOPIC CHOLECYSTECTOMY WITH INTRAOPERATIVE CHOLANGIOGRAM;  Surgeon: Velora Hecklerodd M Gerkin, MD;  Location: [facility];  Service: General;  Laterality: N/A;  . colonscopy  2012 and 2006  . HERNIA REPAIR  2005 - approx  . TONSILLECTOMY  1960 - approx    reports that he has never smoked. He has never used smokeless tobacco. He reports that he drinks alcohol. He reports that he does not use drugs. family history includes Cancer in his father; Hypertension in his maternal grandfather; Stroke in his maternal grandfather. No Known Allergies   Review of Systems  Constitutional: Negative for activity change, appetite change and fever.  Respiratory: Negative for cough and shortness of breath.   Cardiovascular: Negative for chest pain and leg swelling.  Gastrointestinal: Negative for abdominal pain and vomiting.   Genitourinary: Negative for dysuria, flank pain and hematuria.  Musculoskeletal: Positive for back pain. Negative for joint swelling.  Neurological: Negative for weakness.       Objective:   Physical Exam  Constitutional: He appears well-developed and well-nourished.  Cardiovascular: Normal rate and regular rhythm.  Musculoskeletal: He exhibits no edema.  Full range of motion both hips. No localized spinal tenderness  SLRs negative  Neurological:  2+ reflexes knee and ankle bilaterally. He has full-strength with plantar flexion, dorsiflexion, and knee extension bilaterally       Assessment:     Bilateral lumbar back pain with questionable mild radiculitis symptoms bilaterally. ?neurogenic claudication- though he has tolerated walking fairly well.  Nonfocal exam     Plan:     -Plain films of lumbosacral spine to further assess- shows degenerative changes -esp L5-S1.   To be over read per radiology. -consider trial of physical therapy. -If not improving with PT trial, consider sports medicine or ortho referral. -follow up immediately for any weakness or urine/stool incontinence.  Kristian CoveyBruce W Halena Mohar MD [place] [facility]

## 2017-09-09 NOTE — Patient Instructions (Signed)
Let's set up some physical therapy  Give me some feedback in a few weeks if no better.

## 2017-09-22 ENCOUNTER — Encounter: Payer: Self-pay | Admitting: Family Medicine

## 2017-10-04 ENCOUNTER — Encounter: Payer: Self-pay | Admitting: Family Medicine

## 2017-10-08 ENCOUNTER — Encounter: Payer: Self-pay | Admitting: Family Medicine

## 2017-10-08 DIAGNOSIS — M545 Low back pain, unspecified: Secondary | ICD-10-CM

## 2017-10-08 DIAGNOSIS — R200 Anesthesia of skin: Secondary | ICD-10-CM

## 2017-10-10 NOTE — Addendum Note (Signed)
Addended by: Johnella MoloneyFUNDERBURK, JO A on: 10/10/2017 04:06 PM   Modules accepted: Orders

## 2017-10-14 ENCOUNTER — Other Ambulatory Visit: Payer: Self-pay | Admitting: Family Medicine

## 2017-10-16 ENCOUNTER — Encounter: Payer: Self-pay | Admitting: Family Medicine

## 2017-10-16 ENCOUNTER — Other Ambulatory Visit: Payer: Self-pay | Admitting: Family Medicine

## 2017-10-16 ENCOUNTER — Other Ambulatory Visit: Payer: Self-pay

## 2017-10-16 MED ORDER — PRAVASTATIN SODIUM 40 MG PO TABS: 40.0000 mg | ORAL_TABLET | Freq: Every day | ORAL | 0 refills | Status: DC

## 2017-10-16 NOTE — Telephone Encounter (Signed)
Refill once.  Does need follow up.  Lipids and hepatic not checked over one year.  Consider CPE if he is willing.

## 2017-10-16 NOTE — Telephone Encounter (Signed)
Please advise if okay to fill 90 days  Last OV 09/09/17 (Lumbar Pain), No future OV  Last labs 09/18/16

## 2017-10-22 ENCOUNTER — Other Ambulatory Visit: Payer: Self-pay

## 2017-10-22 MED ORDER — PRAVASTATIN SODIUM 40 MG PO TABS: 40.0000 mg | ORAL_TABLET | Freq: Every day | ORAL | 0 refills | Status: DC

## 2017-10-23 ENCOUNTER — Other Ambulatory Visit: Payer: Self-pay

## 2017-10-23 ENCOUNTER — Ambulatory Visit (INDEPENDENT_AMBULATORY_CARE_PROVIDER_SITE_OTHER): Payer: PRIVATE HEALTH INSURANCE | Admitting: Family Medicine

## 2017-10-23 ENCOUNTER — Encounter: Payer: Self-pay | Admitting: Family Medicine

## 2017-10-23 VITALS — BP 128/80 | HR 58 | Temp 97.6°F | Ht 73.25 in | Wt 214.6 lb

## 2017-10-23 DIAGNOSIS — I10 Essential (primary) hypertension: Secondary | ICD-10-CM

## 2017-10-23 DIAGNOSIS — E785 Hyperlipidemia, unspecified: Secondary | ICD-10-CM

## 2017-10-23 DIAGNOSIS — K219 Gastro-esophageal reflux disease without esophagitis: Secondary | ICD-10-CM

## 2017-10-23 DIAGNOSIS — Z862 Personal history of diseases of the blood and blood-forming organs and certain disorders involving the immune mechanism: Secondary | ICD-10-CM

## 2017-10-23 LAB — CBC WITH DIFFERENTIAL/PLATELET
Basophils Absolute: 0.1 10*3/uL (ref 0.0–0.1)
Basophils Relative: 1 % (ref 0.0–3.0)
Eosinophils Absolute: 0.2 10*3/uL (ref 0.0–0.7)
Eosinophils Relative: 3 % (ref 0.0–5.0)
HCT: 40 % (ref 39.0–52.0)
Hemoglobin: 13.8 g/dL (ref 13.0–17.0)
Lymphocytes Relative: 31.7 % (ref 12.0–46.0)
Lymphs Abs: 1.8 10*3/uL (ref 0.7–4.0)
MCHC: 34.5 g/dL (ref 30.0–36.0)
MCV: 84.6 fl (ref 78.0–100.0)
Monocytes Absolute: 0.5 10*3/uL (ref 0.1–1.0)
Monocytes Relative: 9.6 % (ref 3.0–12.0)
Neutro Abs: 3 10*3/uL (ref 1.4–7.7)
Neutrophils Relative %: 54.7 % (ref 43.0–77.0)
Platelets: 214 10*3/uL (ref 150.0–400.0)
RBC: 4.73 Mil/uL (ref 4.22–5.81)
RDW: 14.4 % (ref 11.5–15.5)
WBC: 5.5 10*3/uL (ref 4.0–10.5)

## 2017-10-23 LAB — HEPATIC FUNCTION PANEL
ALT: 30 U/L (ref 0–53)
AST: 20 U/L (ref 0–37)
Albumin: 4.7 g/dL (ref 3.5–5.2)
Alkaline Phosphatase: 60 U/L (ref 39–117)
Bilirubin, Direct: 0.1 mg/dL (ref 0.0–0.3)
Total Bilirubin: 0.7 mg/dL (ref 0.2–1.2)
Total Protein: 7.3 g/dL (ref 6.0–8.3)

## 2017-10-23 LAB — BASIC METABOLIC PANEL WITH GFR
BUN: 19 mg/dL (ref 6–23)
CO2: 30 meq/L (ref 19–32)
Calcium: 9.6 mg/dL (ref 8.4–10.5)
Chloride: 100 meq/L (ref 96–112)
Creatinine, Ser: 1.13 mg/dL (ref 0.40–1.50)
GFR: 69.41 mL/min
Glucose, Bld: 108 mg/dL — ABNORMAL HIGH (ref 70–99)
Potassium: 4.9 meq/L (ref 3.5–5.1)
Sodium: 139 meq/L (ref 135–145)

## 2017-10-23 LAB — LIPID PANEL
CHOLESTEROL: 180 mg/dL (ref 0–200)
HDL: 66.8 mg/dL (ref >39.00)
LDL Cholesterol: 99 mg/dL (ref 0–99)
NonHDL: 113.36
TRIGLYCERIDES: 71 mg/dL (ref 0.0–149.0)
Total CHOL/HDL Ratio: 3
VLDL: 14.2 mg/dL (ref 0.0–40.0)

## 2017-10-23 MED ORDER — LOSARTAN POTASSIUM 50 MG PO TABS: 50.0000 mg | ORAL_TABLET | Freq: Every day | ORAL | 3 refills | Status: DC

## 2017-10-23 MED ORDER — PRAVASTATIN SODIUM 40 MG PO TABS: 40.0000 mg | ORAL_TABLET | Freq: Every day | ORAL | 3 refills | Status: DC

## 2017-10-23 NOTE — Progress Notes (Signed)
Subjective:     Patient ID: Eric Duran, male   DOB: [birth year], 62 y.o.   MRN: [id number]  HPI Patient here to discuss the following  Recent low back pain.  We had set up referral to see orthopedic back specialist.  He has an MRI scan pending.  His back pain is actually improving.  He had some left lower extremity pain and still has some pain in the leg but overall improved.  No weakness.  His back pain has curtailed his exercise over recent months and is gained some recent weight back  Hyperlipidemia treated with pravastatin 40 mg daily.  Tolerating with no side effects.  Needs follow-up lab work.  He has no history of CAD or peripheral vascular disease.  Hypertension treated with losartan.  No dizziness.  No chest pains.  Compliant with therapy.  Blood pressure well controlled.  He has history of GERD controlled with Nexium 20 mg daily.  Patient had mild anemia by history in the past.  He states he went recently to donate blood and had hemoglobin around 13.  He has had GI work-up previously.  No obvious blood loss symptoms.  Does have history of Crohn's disease.  Past Medical History:  Diagnosis Date  . ANEMIA, IRON DEFICIENCY 07/22/2009  . ASTHMA 07/22/2009   mild  . GERD 07/22/2009  . HYPERLIPIDEMIA 07/22/2009  . Kidney stones 2001   6 times in past  . OTITIS MEDIA, SUPPURATIVE 11/09/2009  . PREDIABETES 07/22/2009   pt does not monitor cbg at home   Past Surgical History:  Procedure Laterality Date  . BIOPSY BOWEL  1982   exploration bowel surgery, chrons disease  . CHOLECYSTECTOMY  02/26/2011   Procedure: LAPAROSCOPIC CHOLECYSTECTOMY WITH INTRAOPERATIVE CHOLANGIOGRAM;  Surgeon: Velora Heckler, MD;  Location: [facility];  Service: General;  Laterality: N/A;  . colonscopy  2012 and 2006  . HERNIA REPAIR  2005 - approx  . TONSILLECTOMY  1960 - approx    reports that he has never smoked. He has never used smokeless tobacco. He reports that he drinks alcohol. He reports that he does not  use drugs. family history includes Cancer in his father; Hypertension in his maternal grandfather; Stroke in his maternal grandfather. No Known Allergies   Review of Systems  Constitutional: Negative for fatigue.  Eyes: Negative for visual disturbance.  Respiratory: Negative for cough, chest tightness and shortness of breath.   Cardiovascular: Negative for chest pain, palpitations and leg swelling.  Musculoskeletal: Positive for back pain.  Neurological: Negative for dizziness, syncope, weakness, light-headedness and headaches.       Objective:   Physical Exam  Constitutional: He appears well-developed and well-nourished.  Cardiovascular: Normal rate and regular rhythm. Exam reveals no gallop.  Pulmonary/Chest: Effort normal and breath sounds normal. He has no wheezes. He has no rales.  Musculoskeletal: He exhibits no edema.  Neurological: He is alert.       Assessment:     #1 hypertension stable and at goal  #2 dyslipidemia treated with pravastatin  #3 history of GERD controlled with low-dose Nexium  #4 low back pain with recent left lumbar radiculitis symptoms gradually improving with physical therapy.  Pending follow-up with orthopedic back specialist   #5 past history of mild iron deficiency anemia    Plan:     -Refill pravastatin and losartan for 1 year -Check labs with basic metabolic panel, hepatic panel, lipid panel, CBC -He plans to get flu vaccine later this fall -Hopefully can resume  exercise regimen soon as his back pain improves  Kristian Covey MD [facility]

## 2017-10-24 ENCOUNTER — Encounter: Payer: Self-pay | Admitting: Family Medicine

## 2017-11-05 ENCOUNTER — Ambulatory Visit (INDEPENDENT_AMBULATORY_CARE_PROVIDER_SITE_OTHER): Payer: PRIVATE HEALTH INSURANCE

## 2017-11-05 DIAGNOSIS — Z23 Encounter for immunization: Secondary | ICD-10-CM | POA: Diagnosis not present

## 2017-12-22 ENCOUNTER — Encounter: Payer: Self-pay | Admitting: Family Medicine

## 2018-01-09 DIAGNOSIS — M5126 Other intervertebral disc displacement, lumbar region: Secondary | ICD-10-CM | POA: Insufficient documentation

## 2018-01-09 DIAGNOSIS — M5417 Radiculopathy, lumbosacral region: Secondary | ICD-10-CM | POA: Insufficient documentation

## 2018-01-30 DIAGNOSIS — E785 Hyperlipidemia, unspecified: Secondary | ICD-10-CM | POA: Insufficient documentation

## 2018-02-03 DIAGNOSIS — Z9889 Other specified postprocedural states: Secondary | ICD-10-CM | POA: Insufficient documentation

## 2018-07-02 ENCOUNTER — Other Ambulatory Visit: Payer: Self-pay

## 2018-07-02 ENCOUNTER — Ambulatory Visit (INDEPENDENT_AMBULATORY_CARE_PROVIDER_SITE_OTHER): Payer: PRIVATE HEALTH INSURANCE | Admitting: Family Medicine

## 2018-07-02 DIAGNOSIS — R202 Paresthesia of skin: Secondary | ICD-10-CM

## 2018-07-02 DIAGNOSIS — R600 Localized edema: Secondary | ICD-10-CM | POA: Diagnosis not present

## 2018-07-02 DIAGNOSIS — L02212 Cutaneous abscess of back [any part, except buttock]: Secondary | ICD-10-CM | POA: Diagnosis not present

## 2018-07-02 MED ORDER — DOXYCYCLINE HYCLATE 100 MG PO CAPS: 100.0000 mg | ORAL_CAPSULE | Freq: Two times a day (BID) | ORAL | 0 refills | Status: DC

## 2018-07-02 NOTE — Progress Notes (Signed)
Patient ID: Eric Duran, male   DOB: Dec 13, 1955, 63 y.o.   MRN: [id number]  This visit type was conducted due to national recommendations for restrictions regarding the COVID-19 pandemic in an effort to limit this patient's exposure and mitigate transmission in our community.   Virtual Visit via Video Note  I connected with Eric Duran on 07/02/18 at  4:45 PM EDT by a video enabled telemedicine application and verified that I am speaking with the correct person using two identifiers.  Location patient: home Location provider:work or home office Persons participating in the virtual visit: patient, provider  I discussed the limitations of evaluation and management by telemedicine and the availability of in person appointments. The patient expressed understanding and agreed to proceed.   HPI:  Patient had called to discuss the following items  He has had some redness and drainage from the area on his back around the midline down to the lower thoracic region.  This is about the size of a silver dollar and has been oozing.  His wife thinks the redness has spread slightly.  No fevers or chills.  Nonfluctuant.  Patient had L4-L5 microdiscectomy and laminectomy back in January of this year at [facility].  He has recovered fairly well from the surgery.  He has had some persistent numbness involving the right foot since the surgery.  He had some numbness even prior to surgery but this has improved slightly.  He has occasional tingling sensation on the bottom of the right foot and has what sounds like some weakness with dorsiflexion on the right.  He is ambulating without much difficulty.  Has recently noted that he seemed to have some swelling intermittently around the foot and ankle region.  He has noticed some bluish discoloration which is somewhat intermittent.  He states that his foot is somewhat cool to touch but wife notes that the left is also cool to touch.  He is not having any claudication  symptoms whatsoever.  No leg or calf pain.  Denies any leg edema.  Swelling seems to be worse late in the day.  No urine or stool incontinence   ROS: See pertinent positives and negatives per HPI.  Past Medical History:  Diagnosis Date  . ANEMIA, IRON DEFICIENCY 07/22/2009  . ASTHMA 07/22/2009   mild  . GERD 07/22/2009  . HYPERLIPIDEMIA 07/22/2009  . Kidney stones 2001   6 times in past  . OTITIS MEDIA, SUPPURATIVE 11/09/2009  . PREDIABETES 07/22/2009   pt does not monitor cbg at home    Past Surgical History:  Procedure Laterality Date  . BIOPSY BOWEL  1982   exploration bowel surgery, chrons disease  . CHOLECYSTECTOMY  02/26/2011   Procedure: LAPAROSCOPIC CHOLECYSTECTOMY WITH INTRAOPERATIVE CHOLANGIOGRAM;  Surgeon: Velora Heckler, MD;  Location: [facility];  Service: General;  Laterality: N/A;  . colonscopy  2012 and 2006  . HERNIA REPAIR  2005 - approx  . TONSILLECTOMY  1960 - approx    Family History  Problem Relation Age of Onset  . Cancer Father        colon  . Hypertension Maternal Grandfather   . Stroke Maternal Grandfather     SOCIAL HX: Non-smoker.   Current Outpatient Medications:  .  doxycycline (VIBRAMYCIN) 100 MG capsule, Take 1 capsule (100 mg total) by mouth 2 (two) times daily., Disp: 20 capsule, Rfl: 0 .  esomeprazole (NEXIUM) 20 MG capsule, Take 20 mg by mouth daily at 12 noon., Disp: , Rfl:  .  losartan (COZAAR) 50 MG tablet, Take 1 tablet (50 mg total) by mouth daily., Disp: 90 tablet, Rfl: 3 .  pravastatin (PRAVACHOL) 40 MG tablet, Take 1 tablet (40 mg total) by mouth daily., Disp: 90 tablet, Rfl: 3  EXAM:  VITALS per patient if applicable:  GENERAL: alert, oriented, appears well and in no acute distress  HEENT: atraumatic, conjunttiva clear, no obvious abnormalities on inspection of external nose and ears  NECK: normal movements of the head and neck  LUNGS: on inspection no signs of respiratory distress, breathing rate appears normal, no obvious  gross SOB, gasping or wheezing  CV: no obvious cyanosis  MS: moves all visible extremities without noticeable abnormality  PSYCH/NEURO: pleasant and cooperative, no obvious depression or anxiety, speech and thought processing grossly intact  Skin-patient has area of redness which looks to be about 2 x 2 cm.  Wife states this is indurated and nonfluctuant.  ASSESSMENT AND PLAN:  Discussed the following assessment and plan:  #1 probable abscess involving lower thoracic region of back.  This is described as nonfluctuant.  He has some mild surrounding cellulitis changes  -Recommend warm compresses several times daily -Start doxycycline 100 mg twice daily for 10 days -If any fluctuance noted recommend follow-up for drainage  #2 intermittent swelling and occasional color changes right ankle and foot.  He is not describing any claudication symptoms.  He apparently has some postsurgical changes in terms of some persistent numbness and question of some weakness with dorsiflexion since the surgery  -We reviewed signs and symptoms to look out for with arterial compromise.  He has no claudication symptoms whatsoever. This does not sound arterial. -They have compression stockings and will start using those again consistently and frequent elevation. -We recommended office evaluation for further check if he has any persistent or worsening swelling or any new symptoms such as pain -may need to get venous doppler - especially for any leg or progressive swelling.   I discussed the assessment and treatment plan with the patient. The patient was provided an opportunity to ask questions and all were answered. The patient agreed with the plan and demonstrated an understanding of the instructions.   The patient was advised to call back or seek an in-person evaluation if the symptoms worsen or if the condition fails to improve as anticipated.   Evelena PeatBruce Monik Lins, MD

## 2018-08-11 ENCOUNTER — Other Ambulatory Visit: Payer: Self-pay | Admitting: Family Medicine

## 2018-09-16 DIAGNOSIS — R29898 Other symptoms and signs involving the musculoskeletal system: Secondary | ICD-10-CM | POA: Insufficient documentation

## 2018-09-24 ENCOUNTER — Telehealth: Payer: Self-pay | Admitting: Family Medicine

## 2018-09-24 ENCOUNTER — Ambulatory Visit: Payer: Self-pay | Admitting: Family Medicine

## 2018-09-24 ENCOUNTER — Telehealth (INDEPENDENT_AMBULATORY_CARE_PROVIDER_SITE_OTHER): Payer: Medicare Other | Admitting: Family Medicine

## 2018-09-24 ENCOUNTER — Other Ambulatory Visit: Payer: Self-pay

## 2018-09-24 DIAGNOSIS — H8111 Benign paroxysmal vertigo, right ear: Secondary | ICD-10-CM | POA: Diagnosis not present

## 2018-09-24 DIAGNOSIS — R110 Nausea: Secondary | ICD-10-CM

## 2018-09-24 NOTE — Telephone Encounter (Signed)
LMVM for the patient to contact the office to schedule a virtual appointment with Dr. Elease Hashimoto. I am also sending a MyChart message since I was not able to reach the patient by phone.

## 2018-09-24 NOTE — Telephone Encounter (Signed)
I have the patient scheduled for this afternoon with Dr. Elease Hashimoto for a virtual visit.

## 2018-09-24 NOTE — Telephone Encounter (Signed)
Pt reports "Room spinning at times" onset yesterday AM. States intermittent, worsens with positional changes, "Turning in bed, bending over a little, going from sitting to standing." Also reports occurs "When I do my back stretching exercises. " States is staying hydrated, no new medications, denies weakness, headache, no congestion or sinus tenderness. States "Ears are itchy." BP this AM 140/90, HR 63. Pt is scheduled for back surgery Friday. Call transferred to practice, [facility], for consideration of appt. Verified pt's email and ph #. Care advise given per protocol. Pt verbalizes understanding.  CB# [phone]  Reason for Disposition . [1] MODERATE dizziness (e.g., vertigo; feels very unsteady, interferes with normal activities) AND [2] has NOT been evaluated by physician for this  Answer Assessment - Initial Assessment Questions 1. DESCRIPTION: "Describe your dizziness."     Spinning 2. VERTIGO: "Do you feel like either you or the room is spinning or tilting?"      yes 3. LIGHTHEADED: "Do you feel lightheaded?" (e.g., somewhat faint, woozy, weak upon standing)     yes 4. SEVERITY: "How bad is it?"  "Can you walk?"   - MILD - Feels unsteady but walking normally.   - MODERATE - Feels very unsteady when walking, but not falling; interferes with normal activities (e.g., school, work) .   - SEVERE - Unable to walk without falling (requires assistance).     Moderate 5. ONSET:  "When did the dizziness begin?"     Yesterday morning 6. AGGRAVATING FACTORS: "Does anything make it worse?" (e.g., standing, change in head position)    "Stretching, turning in bed, sitting to standing, bending over a little" 7. CAUSE: "What do you think is causing the dizziness?"     Unsure 8. RECURRENT SYMPTOM: "Have you had dizziness before?" If so, ask: "When was the last time?" "What happened that time?"     no 9. OTHER SYMPTOMS: "Do you have any other symptoms?" (e.g., headache, weakness, numbness, vomiting,  earache)    Ears itchy  Protocols used: DIZZINESS - VERTIGO-A-AH

## 2018-09-24 NOTE — Progress Notes (Signed)
This visit type was conducted due to national recommendations for restrictions regarding the COVID-19 pandemic in an effort to limit this patient's exposure and mitigate transmission in our community.   Virtual Visit via Video Note  I connected with Eric LemaRoger Bartosiewicz on 09/24/18 at  4:30 PM EDT by a video enabled telemedicine application and verified that I am speaking with the correct person using two identifiers.  Location patient: home Location provider:work or home office Persons participating in the virtual visit: patient, provider  I discussed the limitations of evaluation and management by telemedicine and the availability of in person appointments. The patient expressed understanding and agreed to proceed.   HPI: Patient relates onset yesterday morning of severe vertigo when rolling over in bed from his left to right side.  He had some intermittent symptoms through the day.  He had same occurrence this morning which was worst time of day and has improved slightly throughout the day.  He did have some associated nausea but no vomiting.  Denies any recent hearing loss.  No tinnitus.  Denies any dysphagia, slurred speech, focal weakness, visual changes or any ataxia.  Has had occasional headaches but somewhat intermittent.  He had been taking gabapentin 300 mg 3 times daily but tapered himself off very rapidly last week and thinks that may have triggered the headache.  He denies prior history of vertigo.  Moving head to the right clearly seems to trigger.  He does not describe any orthostatic symptoms.  No recent fevers or chills.  He does have back surgery scheduled for this Friday 2 days from now.   ROS: See pertinent positives and negatives per HPI.  Past Medical History:  Diagnosis Date  . ANEMIA, IRON DEFICIENCY 07/22/2009  . ASTHMA 07/22/2009   mild  . GERD 07/22/2009  . HYPERLIPIDEMIA 07/22/2009  . Kidney stones 2001   6 times in past  . OTITIS MEDIA, SUPPURATIVE 11/09/2009  .  PREDIABETES 07/22/2009   pt does not monitor cbg at home    Past Surgical History:  Procedure Laterality Date  . BIOPSY BOWEL  1982   exploration bowel surgery, chrons disease  . CHOLECYSTECTOMY  02/26/2011   Procedure: LAPAROSCOPIC CHOLECYSTECTOMY WITH INTRAOPERATIVE CHOLANGIOGRAM;  Surgeon: Velora Hecklerodd M Gerkin, MD;  Location: [facility];  Service: General;  Laterality: N/A;  . colonscopy  2012 and 2006  . HERNIA REPAIR  2005 - approx  . TONSILLECTOMY  1960 - approx    Family History  Problem Relation Age of Onset  . Cancer Father        colon  . Hypertension Maternal Grandfather   . Stroke Maternal Grandfather     SOCIAL HX: Non-smoker   Current Outpatient Medications:  .  doxycycline (VIBRAMYCIN) 100 MG capsule, Take 1 capsule (100 mg total) by mouth 2 (two) times daily., Disp: 20 capsule, Rfl: 0 .  esomeprazole (NEXIUM) 20 MG capsule, Take 20 mg by mouth daily at 12 noon., Disp: , Rfl:  .  losartan (COZAAR) 50 MG tablet, Take 1 tablet (50 mg total) by mouth daily., Disp: 90 tablet, Rfl: 0 .  pravastatin (PRAVACHOL) 40 MG tablet, Take 1 tablet (40 mg total) by mouth daily., Disp: 90 tablet, Rfl: 3  EXAM:  VITALS per patient if applicable:  GENERAL: alert, oriented, appears well and in no acute distress  HEENT: atraumatic, conjunttiva clear, no obvious abnormalities on inspection of external nose and ears  NECK: normal movements of the head and neck  LUNGS: on inspection no signs of  respiratory distress, breathing rate appears normal, no obvious gross SOB, gasping or wheezing  CV: no obvious cyanosis  MS: moves all visible extremities without noticeable abnormality  PSYCH/NEURO: pleasant and cooperative, no obvious depression or anxiety, speech and thought processing grossly intact  ASSESSMENT AND PLAN:  Discussed the following assessment and plan:  Vertigo with onset yesterday.  By description, sounds like benign peripheral positional vertigo to the right.  He does not  have any red flags for more worrisome vertigo such as cerebellar findings.  -Recommend Epley maneuvers to the right and he will try some home exercises be in touch if not clearing over the next day -We discussed the limited role of medication such as meclizine but have recommended against any medicines unless his nausea is more severe at this time    I discussed the assessment and treatment plan with the patient. The patient was provided an opportunity to ask questions and all were answered. The patient agreed with the plan and demonstrated an understanding of the instructions.   The patient was advised to call back or seek an in-person evaluation if the symptoms worsen or if the condition fails to improve as anticipated.   Carolann Littler, MD

## 2018-09-24 NOTE — Telephone Encounter (Signed)
Was this pt scheduled? I don't see an appt.

## 2018-09-29 ENCOUNTER — Encounter: Payer: Self-pay | Admitting: Family Medicine

## 2018-09-29 MED ORDER — AMOXICILLIN-POT CLAVULANATE 875-125 MG PO TABS: 1.0000 | ORAL_TABLET | Freq: Two times a day (BID) | ORAL | 0 refills | Status: DC

## 2018-09-29 NOTE — Telephone Encounter (Signed)
Spoke with patient.  He had recent vertigo symptoms and those are actually slowly improving.  Has had some frontal sinus pressure especially behind the right eye and some discomfort and pressure on the bridge of the nose and intermittent headaches.  No fever.  No bloody nasal discharge.  He had back surgery last Friday which went well.  He is concerned about having sinus infection.  We agreed to call in Augmentin 875 mg twice daily for 10 days.  Office follow-up by next week if not improving

## 2018-10-28 ENCOUNTER — Encounter: Payer: Self-pay | Admitting: Family Medicine

## 2018-10-28 NOTE — Telephone Encounter (Signed)
Please advise. Would you like to see the patient in office and we can administer the vaccines at that time?

## 2018-11-05 ENCOUNTER — Encounter: Payer: Self-pay | Admitting: Family Medicine

## 2018-11-05 ENCOUNTER — Other Ambulatory Visit: Payer: Self-pay

## 2018-11-05 ENCOUNTER — Ambulatory Visit (INDEPENDENT_AMBULATORY_CARE_PROVIDER_SITE_OTHER): Payer: Medicare Other | Admitting: Family Medicine

## 2018-11-05 VITALS — BP 128/62 | HR 71 | Temp 98.1°F | Wt 221.5 lb

## 2018-11-05 DIAGNOSIS — I10 Essential (primary) hypertension: Secondary | ICD-10-CM | POA: Diagnosis not present

## 2018-11-05 DIAGNOSIS — Z23 Encounter for immunization: Secondary | ICD-10-CM | POA: Diagnosis not present

## 2018-11-05 DIAGNOSIS — E785 Hyperlipidemia, unspecified: Secondary | ICD-10-CM

## 2018-11-05 DIAGNOSIS — R600 Localized edema: Secondary | ICD-10-CM

## 2018-11-05 NOTE — Progress Notes (Signed)
  Subjective:     Patient ID: Eric Duran, male   DOB: [birth year], 63 y.o.   MRN: [id number]  HPI Here for right ankle and lower leg edema for at least 3 months.  We discussed this briefly back in June.  He had back surgery in August and is done well since that time.  No prior history of DVT.  He does not describe any claudication symptoms.  No pain.  He has worn some venous compression from prior surgery temporarily which seemed to help.  He denies any chest pains or dyspnea.  No recent injury.  Non-smoker.  He does think his father had PE.  [place]ers chronic problems include hypertension hyperlipidemia.  Blood pressure well controlled with losartan he takes pravastatin for hyperlipidemia.  Past Medical History:  Diagnosis Date  . ANEMIA, IRON DEFICIENCY 07/22/2009  . ASTHMA 07/22/2009   mild  . GERD 07/22/2009  . HYPERLIPIDEMIA 07/22/2009  . Kidney stones 2001   6 times in past  . OTITIS MEDIA, SUPPURATIVE 11/09/2009  . PREDIABETES 07/22/2009   pt does not monitor cbg at home   Past Surgical History:  Procedure Laterality Date  . BIOPSY BOWEL  1982   exploration bowel surgery, chrons disease  . CHOLECYSTECTOMY  02/26/2011   Procedure: LAPAROSCOPIC CHOLECYSTECTOMY WITH INTRAOPERATIVE CHOLANGIOGRAM;  Surgeon: Earnstine Regal, MD;  Location: [facility];  Service: General;  Laterality: N/A;  . colonscopy  2012 and 2006  . HERNIA REPAIR  2005 - approx  . TONSILLECTOMY  1960 - approx    reports that he has never smoked. He has never used smokeless tobacco. He reports current alcohol use. He reports that he does not use drugs. family history includes Cancer in his father; Hypertension in his maternal grandfather; Stroke in his maternal grandfather. No Known Allergies   Review of Systems  Constitutional: Negative for appetite change, chills, fever and unexpected weight change.  Respiratory: Negative for shortness of breath.   Cardiovascular: Negative for chest pain.  Musculoskeletal: Negative for  back pain.  Neurological: Negative for dizziness and syncope.       Objective:   Physical Exam Constitutional:      Appearance: Normal appearance.  Cardiovascular:     Rate and Rhythm: Normal rate and regular rhythm.  Pulmonary:     Effort: Pulmonary effort is normal.     Breath sounds: Normal breath sounds.  Musculoskeletal:     Comments: He has mild edema right ankle and foot and lower leg compared to the left.  Right foot is slightly cool to touch but so is the left.  He has 2+ posterior tibial pulses bilaterally and 1+ to 2+ dorsalis pedis bilaterally.  Good capillary refill throughout in both feet.  No localized tenderness.  Skin:    Comments: There is some subtle darker coloration of the right lower extremity around the foot ankle and lower leg compared to the left.        Assessment:     #1 asymmetric lower extremity edema right greater than left.  Suspect venous disease.  Rule out DVT  #2 hypertension stable and at goal  #3 hyperlipidemia treated with pravastatin    Plan:     -Obtain venous Dopplers lower extremities to further assess -Flu vaccine and Pneumovax given -He does need to get follow-up lipids soon but he wishes to defer at this time.  He is nonfasting this morning  Eulas Post MD [facility]

## 2018-11-05 NOTE — Addendum Note (Signed)
Addended by: Rebecca Eaton on: 11/05/2018 09:14 AM   Modules accepted: Orders

## 2018-11-05 NOTE — Patient Instructions (Signed)
I will be setting up venous doppler to further assess.

## 2018-11-06 ENCOUNTER — Other Ambulatory Visit: Payer: Self-pay | Admitting: Family Medicine

## 2018-11-11 DIAGNOSIS — R202 Paresthesia of skin: Secondary | ICD-10-CM | POA: Insufficient documentation

## 2018-11-12 ENCOUNTER — Other Ambulatory Visit: Payer: Self-pay

## 2018-11-12 ENCOUNTER — Ambulatory Visit (HOSPITAL_COMMUNITY)
Admission: RE | Admit: 2018-11-12 | Discharge: 2018-11-12 | Disposition: A | Discharge status: Home / Self Care | Payer: Medicare Other | Source: Ambulatory Visit | Attending: Cardiology | Admitting: Cardiology

## 2018-11-12 DIAGNOSIS — R600 Localized edema: Secondary | ICD-10-CM

## 2018-11-17 ENCOUNTER — Other Ambulatory Visit: Payer: Self-pay | Admitting: Family Medicine

## 2018-11-17 NOTE — Telephone Encounter (Signed)
Please see request

## 2018-11-19 MED ORDER — LOSARTAN POTASSIUM 50 MG PO TABS: 50.0000 mg | ORAL_TABLET | Freq: Every day | ORAL | 0 refills | Status: DC

## 2018-12-11 ENCOUNTER — Encounter: Payer: Self-pay | Admitting: Family Medicine

## 2018-12-12 ENCOUNTER — Other Ambulatory Visit: Payer: Self-pay

## 2018-12-12 MED ORDER — PRAVASTATIN SODIUM 40 MG PO TABS: 40.0000 mg | ORAL_TABLET | Freq: Every day | ORAL | 1 refills | Status: DC

## 2018-12-15 ENCOUNTER — Telehealth: Payer: Self-pay | Admitting: *Deleted

## 2018-12-15 DIAGNOSIS — I10 Essential (primary) hypertension: Secondary | ICD-10-CM

## 2018-12-15 DIAGNOSIS — E785 Hyperlipidemia, unspecified: Secondary | ICD-10-CM

## 2018-12-15 NOTE — Telephone Encounter (Signed)
Copied from [place] [id number]. Topic: General - Inquiry >> Dec 15, 2018  3:44 PM Virl Axe D wrote: Reason for CRM: Pt stated he is supposed to have fasting labs. Spoke with [facility] via [employer]. No new orders placed. Please advise. Pt scheduled for 11/118/20.

## 2018-12-16 NOTE — Addendum Note (Signed)
Addended by: Eulas Post on: 12/16/2018 08:08 AM   Modules accepted: Orders

## 2018-12-16 NOTE — Telephone Encounter (Signed)
Labs ordered.

## 2018-12-16 NOTE — Telephone Encounter (Signed)
Please order labs that are needed. Thank you!

## 2018-12-17 ENCOUNTER — Other Ambulatory Visit (INDEPENDENT_AMBULATORY_CARE_PROVIDER_SITE_OTHER): Payer: Medicare Other

## 2018-12-17 ENCOUNTER — Other Ambulatory Visit: Payer: Self-pay

## 2018-12-17 DIAGNOSIS — E785 Hyperlipidemia, unspecified: Secondary | ICD-10-CM

## 2018-12-17 DIAGNOSIS — I10 Essential (primary) hypertension: Secondary | ICD-10-CM

## 2018-12-17 LAB — HEPATIC FUNCTION PANEL
ALT: 35 U/L (ref 0–53)
AST: 20 U/L (ref 0–37)
Albumin: 4.5 g/dL (ref 3.5–5.2)
Alkaline Phosphatase: 62 U/L (ref 39–117)
Bilirubin, Direct: 0.1 mg/dL (ref 0.0–0.3)
Total Bilirubin: 0.8 mg/dL (ref 0.2–1.2)
Total Protein: 6.9 g/dL (ref 6.0–8.3)

## 2018-12-17 LAB — BASIC METABOLIC PANEL
BUN: 18 mg/dL (ref 6–23)
CO2: 32 mEq/L (ref 19–32)
Calcium: 9.7 mg/dL (ref 8.4–10.5)
Chloride: 101 mEq/L (ref 96–112)
Creatinine, Ser: 1.18 mg/dL (ref 0.40–1.50)
GFR: 61.9 mL/min (ref >60.00)
Glucose, Bld: 103 mg/dL — ABNORMAL HIGH (ref 70–99)
Potassium: 4.8 mEq/L (ref 3.5–5.1)
Sodium: 140 mEq/L (ref 135–145)

## 2018-12-17 LAB — LIPID PANEL
Cholesterol: 188 mg/dL (ref 0–200)
HDL: 62.6 mg/dL (ref >39.00)
LDL Cholesterol: 108 mg/dL — ABNORMAL HIGH (ref 0–99)
NonHDL: 125.75
Total CHOL/HDL Ratio: 3
Triglycerides: 87 mg/dL (ref 0.0–149.0)
VLDL: 17.4 mg/dL (ref 0.0–40.0)

## 2018-12-18 ENCOUNTER — Encounter: Payer: Self-pay | Admitting: Family Medicine

## 2019-04-21 ENCOUNTER — Other Ambulatory Visit: Payer: Self-pay

## 2019-04-22 ENCOUNTER — Ambulatory Visit (INDEPENDENT_AMBULATORY_CARE_PROVIDER_SITE_OTHER): Payer: Medicare Other | Admitting: Family Medicine

## 2019-04-22 ENCOUNTER — Encounter: Payer: Self-pay | Admitting: Family Medicine

## 2019-04-22 VITALS — BP 118/64 | HR 65 | Temp 97.6°F | Wt 227.9 lb

## 2019-04-22 DIAGNOSIS — H938X3 Other specified disorders of ear, bilateral: Secondary | ICD-10-CM

## 2019-04-22 DIAGNOSIS — M25472 Effusion, left ankle: Secondary | ICD-10-CM | POA: Diagnosis not present

## 2019-04-22 DIAGNOSIS — G44209 Tension-type headache, unspecified, not intractable: Secondary | ICD-10-CM | POA: Diagnosis not present

## 2019-04-22 DIAGNOSIS — M25471 Effusion, right ankle: Secondary | ICD-10-CM | POA: Diagnosis not present

## 2019-04-22 MED ORDER — CYCLOBENZAPRINE HCL 5 MG PO TABS: 5.0000 mg | ORAL_TABLET | Freq: Every day | ORAL | 1 refills | Status: DC

## 2019-04-22 NOTE — Progress Notes (Signed)
Subjective:     Patient ID: Eric Duran, male   DOB: January 25, 1956, 64 y.o.   MRN: [id number]  HPI   Daryon is seen with dull headache which is somewhat bilateral along with some bilateral ear pressure past several weeks.  No hearing loss.  No vertigo.  No recent flying.  He describes the headache as dull and frequently starting occipital area and spreading bilaterally forward involving the frontal area.  No recent nasal congestion.  He has recently taken some Aleve and ibuprofen with temporary relief.  No visual changes.  No focal weakness.  No nausea or vomiting.  No photosensitivity.  Headache is actually improved with exercise such as walking.  He had recent eye exam and did get new prescription which he is waiting to get filled.  He has been using a vibrator for his neck muscles and that seems to be helping  He has had some chronic edema right lower extremity greater than left and now starting to have some involving the left ankle as well.  This tends to be worse late in the day.  No dyspnea.  Sometimes uses compression  He had venous Dopplers October 2020 which did not show any DVT.  This was reviewed  Past Medical History:  Diagnosis Date  . ANEMIA, IRON DEFICIENCY 07/22/2009  . ASTHMA 07/22/2009   mild  . GERD 07/22/2009  . HYPERLIPIDEMIA 07/22/2009  . Kidney stones 2001   6 times in past  . OTITIS MEDIA, SUPPURATIVE 11/09/2009  . PREDIABETES 07/22/2009   pt does not monitor cbg at home   Past Surgical History:  Procedure Laterality Date  . BIOPSY BOWEL  1982   exploration bowel surgery, chrons disease  . CHOLECYSTECTOMY  02/26/2011   Procedure: LAPAROSCOPIC CHOLECYSTECTOMY WITH INTRAOPERATIVE CHOLANGIOGRAM;  Surgeon: Earnstine Regal, MD;  Location: [facility];  Service: General;  Laterality: N/A;  . colonscopy  2012 and 2006  . HERNIA REPAIR  2005 - approx  . TONSILLECTOMY  1960 - approx    reports that he has never smoked. He has never used smokeless tobacco. He reports current  alcohol use. He reports that he does not use drugs. family history includes Cancer in his father; Hypertension in his maternal grandfather; Stroke in his maternal grandfather. No Known Allergies  Wt Readings from Last 3 Encounters:  04/22/19 227 lb 14.4 oz (103.4 kg)  11/05/18 221 lb 8 oz (100.5 kg)  10/23/17 214 lb 9.6 oz (97.3 kg)     Review of Systems  Constitutional: Negative for chills, fatigue, fever and unexpected weight change.  Eyes: Negative for visual disturbance.  Respiratory: Negative for cough, chest tightness and shortness of breath.   Cardiovascular: Positive for leg swelling. Negative for chest pain and palpitations.  Neurological: Positive for headaches. Negative for dizziness, seizures, syncope, speech difficulty, weakness and light-headedness.  Hematological: Negative for adenopathy.  Psychiatric/Behavioral: Negative for confusion.       Objective:   Physical Exam Vitals reviewed.  Constitutional:      Appearance: He is well-developed.  HENT:     Right Ear: Tympanic membrane and external ear normal.     Left Ear: Tympanic membrane and external ear normal.  Neck:     Comments: He has some upper back trapezius tenderness bilaterally and palpable muscle tension especially left trapezius Cardiovascular:     Rate and Rhythm: Normal rate and regular rhythm.  Pulmonary:     Effort: Pulmonary effort is normal.     Breath sounds: Normal breath  sounds.  Musculoskeletal:     Cervical back: Neck supple.  Neurological:     Mental Status: He is alert.        Assessment:     #1 probable chronic tension type headache.  Nonfocal exam neurologically.  Does have some trapezius muscle tenderness and tightness  #2 bilateral leg edema.  Suspect venous stasis  #3 bilateral ear pressure with normal exam.  No obvious effusion.  No evidence for infection.  Does not sound sinus related with no nasal congestion or sinus pressure    Plan:     -We recommend conservative  measures with continued exercise, heat, muscle massage  -Cautioned about avoiding regular daily use of analgesics for headache  -Trial of low-dose Flexeril 5 mg nightly  -Recommend bilateral compression knee-high for his peripheral edema.  If worsening we could consider low-dose diuretic but would try to avoid diuretics if possible  Kristian Covey MD [facility] at [facility]

## 2019-04-22 NOTE — Patient Instructions (Signed)
Avoid daily use of analgesics  Continue with exercise, heat, massage.    Let me know if headache not improving in next 2-3 weeks.

## 2019-05-11 ENCOUNTER — Other Ambulatory Visit: Payer: Self-pay | Admitting: Family Medicine

## 2019-05-11 MED ORDER — LOSARTAN POTASSIUM 50 MG PO TABS: 50.0000 mg | ORAL_TABLET | Freq: Every day | ORAL | 0 refills | Status: DC

## 2019-05-18 ENCOUNTER — Encounter: Payer: Self-pay | Admitting: Family Medicine

## 2019-05-18 ENCOUNTER — Other Ambulatory Visit: Payer: Self-pay

## 2019-05-18 ENCOUNTER — Ambulatory Visit (INDEPENDENT_AMBULATORY_CARE_PROVIDER_SITE_OTHER): Payer: Medicare Other | Admitting: Family Medicine

## 2019-05-18 VITALS — BP 120/76 | HR 75 | Temp 97.6°F | Wt 227.9 lb

## 2019-05-18 DIAGNOSIS — L02212 Cutaneous abscess of back [any part, except buttock]: Secondary | ICD-10-CM | POA: Diagnosis not present

## 2019-05-18 MED ORDER — PRAVASTATIN SODIUM 40 MG PO TABS: 40.0000 mg | ORAL_TABLET | Freq: Every day | ORAL | 1 refills | Status: DC

## 2019-05-18 NOTE — Patient Instructions (Signed)
Keep dry for 48 hours  May replace outer bandage tomorrow.   Pull packing gauze out of wound by late Wednesday or early Thursday and then clean daily with soap and water.

## 2019-05-18 NOTE — Progress Notes (Signed)
  Subjective:     Patient ID: Eric Duran, male   DOB: 10/28/1955, 64 y.o.   MRN: [id number]  HPI Eric Duran seen with possible infection involving his back.  He states that he noticed a "boil "on his back this past Friday or Saturday.  Progressively more swollen and painful since then.  No fevers or chills.  He tried some heat without much relief.  He has not noted any drainage  Past Medical History:  Diagnosis Date  . ANEMIA, IRON DEFICIENCY 07/22/2009  . ASTHMA 07/22/2009   mild  . GERD 07/22/2009  . HYPERLIPIDEMIA 07/22/2009  . Kidney stones 2001   6 times in past  . OTITIS MEDIA, SUPPURATIVE 11/09/2009  . PREDIABETES 07/22/2009   pt does not monitor cbg at home   Past Surgical History:  Procedure Laterality Date  . BIOPSY BOWEL  1982   exploration bowel surgery, chrons disease  . CHOLECYSTECTOMY  02/26/2011   Procedure: LAPAROSCOPIC CHOLECYSTECTOMY WITH INTRAOPERATIVE CHOLANGIOGRAM;  Surgeon: Velora Heckler, MD;  Location: [facility];  Service: General;  Laterality: N/A;  . colonscopy  2012 and 2006  . HERNIA REPAIR  2005 - approx  . TONSILLECTOMY  1960 - approx    reports that he has never smoked. He has never used smokeless tobacco. He reports current alcohol use. He reports that he does not use drugs. family history includes Cancer in his father; Hypertension in his maternal grandfather; Stroke in his maternal grandfather. No Known Allergies   Review of Systems  Constitutional: Negative for chills and fever.       Objective:   Physical Exam Vitals reviewed.  Constitutional:      Appearance: Normal appearance.  Cardiovascular:     Rate and Rhythm: Normal rate and regular rhythm.  Pulmonary:     Breath sounds: Normal breath sounds.  Skin:    Comments: Swollen area mid thoracic area near the midline with an area approximately 2 cm in diameter of mild erythema and warmth.  This is tender to palpation.  There is some fluctuance near the center.  Neurological:     Mental  Status: He is alert.        Assessment:     Abscess of back.  Suspect abscessed sebaceous cyst    Plan:     -We recommended incision and drainage along with packing of abscess.  Patient consented.  We achieve local anesthesia with 1% plain Xylocaine.  Cleaned the area Betadine.  Using #11 blade made a 1 cm proximally horizontal incision and removed significant amount of pus and what look like some contents of sebaceous cyst sac.  We used some curved hemostats to break up some deeper loculations.  Wound cavity was packed with quarter inch iodoform gauze.  Minimal bleeding.  Patient tolerated well.  Outer dressing applied  -Patient instructed to leave packing in for at least 48 hours.  He will be out of town and will have his wife pull this in 2 to 3 days followed by daily cleaning with soap and water and keep covered until fully healed  -He is aware that this is an abscessed sebaceous cyst and that the cyst sac has not been excised and will likely recur with time.  Follow-up for any recurrent erythema or other concerns  Kristian Covey MD [facility] at [facility]

## 2019-06-18 ENCOUNTER — Encounter: Payer: Self-pay | Admitting: Family Medicine

## 2019-06-22 ENCOUNTER — Other Ambulatory Visit: Payer: Self-pay | Admitting: Family Medicine

## 2019-08-10 ENCOUNTER — Other Ambulatory Visit: Payer: Self-pay | Admitting: Family Medicine

## 2019-08-10 ENCOUNTER — Encounter: Payer: Self-pay | Admitting: Family Medicine

## 2019-10-27 ENCOUNTER — Other Ambulatory Visit: Payer: Self-pay | Admitting: Family Medicine

## 2019-10-27 MED ORDER — LOSARTAN POTASSIUM 50 MG PO TABS: 50.0000 mg | ORAL_TABLET | Freq: Every day | ORAL | 1 refills | Status: DC

## 2019-11-24 ENCOUNTER — Ambulatory Visit (INDEPENDENT_AMBULATORY_CARE_PROVIDER_SITE_OTHER): Payer: Medicare Other

## 2019-11-24 ENCOUNTER — Encounter: Payer: Self-pay | Admitting: Family Medicine

## 2019-11-24 ENCOUNTER — Ambulatory Visit (INDEPENDENT_AMBULATORY_CARE_PROVIDER_SITE_OTHER): Payer: Medicare Other | Admitting: Family Medicine

## 2019-11-24 ENCOUNTER — Other Ambulatory Visit: Payer: Self-pay

## 2019-11-24 VITALS — BP 120/80 | HR 53 | Ht 73.25 in | Wt 235.8 lb

## 2019-11-24 DIAGNOSIS — Z9889 Other specified postprocedural states: Secondary | ICD-10-CM | POA: Diagnosis not present

## 2019-11-24 DIAGNOSIS — R29898 Other symptoms and signs involving the musculoskeletal system: Secondary | ICD-10-CM

## 2019-11-24 NOTE — Patient Instructions (Signed)
Thank you for coming in today.  Please get an Xray today before you leave  I've referred you to Physical Therapy.  Let us know if you don't hear from them in one week.  Plan for MRI.  Recheck following MRI to review results and look at the pictures and talk about options.

## 2019-11-24 NOTE — Progress Notes (Signed)
I, Eric Duran, LAT, ATC, am serving as scribe for Dr. Clementeen Graham.  Eric Duran is a 64 y.o. male who presents to [facility] at [facility] today for LBP and L LE weakness.  He had back surgery January 2020 and a second back surgery August 2020.  He had persistent leg weakness following the first surgery that was not fully rectified by the second surgery.  He notes persistent weakness and numbness in his right leg radiating down the back of the leg to the lateral and plantar foot.  He notes weakness to foot plantarflexion and difficulty with walking at times.  He is not had much help from surgery about this yet.  He has not had trial of physical therapy nor follow-up MRI or other evaluation.  He denies any bowel or bladder dysfunction and feels well otherwise.  He denies significant bothersome tingly sensation or pain.  Main issue is weakness and numbness.    Pertinent review of systems: No fevers or chills  Relevant historical information: Surgery history below S/p Metrex Discectomy L4-5 on 02/03/2018; S/p Metrex Laminectomy / Foraminotomy L5-S1 on the Right on 09/26/2018   Exam:  BP 120/80 (BP Location: Right Arm, Patient Position: Sitting, Cuff Size: Large)   Pulse (!) 53   Ht 6' 1.25" (1.861 m)   Wt 235 lb 12.8 oz (107 kg)   SpO2 99%   BMI 30.90 kg/m  General: Well Developed, well nourished, and in no acute distress.   MSK: L-spine normal-appearing nontender normal motion.  Lower extremity strength reflexes and sensation are equal and normal throughout with the exception of right posterior leg numbness and foot plantarflexion weakness 3/5.  Reflexes diminished right AT. Pulses cap refill and intact distal bilateral extremities.    Lab and Radiology Results  INDICATION: Back pain or radiculopathy, > 6 wks \ M54.17 Lumbosacral radiculopathy  COMPARISON: Outside lumbar spine MR 10/19/2017   TECHNIQUE: Multiplanar, multisequence surface-coil magnetic resonance  imaging of the lumbar spine was performed without contrast.   LEVELS IMAGED: Lower thoracic to the upper sacral region.    FINDINGS:    Alignment: Mild stepwise retrolisthesis from L3-S1, similar to prior. Straightening of the usual lumbarlordosis. Mild lumbar levocurvature.   Vertebrae: Status post bilateral L4-L5 laminectomies. Vertebral body heights are maintained.  No marrow signal abnormalities to suggest neoplasm. Incidental T12 vertebral venous malformation.   Conus medullaris: In normal position. Normal signal and contour.   Degenerative changes: Multilevel disc desiccation and height loss, most pronounced at L5-S1.   T12-L1: No significant spinal canal or neural foraminal stenosis.  L1-L2: Mild facet hypertrophy andligamentum flavum thickening without significant spinal canal or neural foraminal stenosis.  L2-L3: Broad-based disc bulge with annular fissure, mild facet hypertrophy, and ligamentum flavum thickening without significant spinal canal or neural foraminal stenosis.  L3-L4: Broad-based disc bulge, moderate facet hypertrophy, and ligamentum flavum thickening contribute to mild spinal canal stenosis, mild bilateral neural foraminal stenosis, and advanced crowding of the bilateral subarticular zones with mass effect on the descending bilateral L4 nerve roots.  L4-L5: No significant spinal canal stenosis status post decompressive laminectomies. Residual moderate crowding of the left greater than right subarticular zones. Broad-based disc bulge and facet hypertrophy contribute to moderate left and mild right neural foraminal stenosis.  L5-S1: Broad-based disc bulge, dorsal osteophytic ridging, moderate facet hypertrophy, and ligamentum flavum thickening contribute to moderate left greater than right neural foraminal stenosis and crowding of the subarticular zones with mass effect on the bilateral descending  S1 nerve roots. Mild asymmetric T2 signal hyperintensity in  the right S1 nerve root in the S1 neural foramen. The left lateral disc protrusion contacts the exiting left L5 nerve root. Right facet joint effusion.   Upper Sacrum: No focal lesion identified.   Additional comments: Sigmoid diverticulosis.   X-ray images L-spine obtained today personally and independently interpreted DDD L5-S1. No fracture.  Await formal radiology review   Assessment and Plan: 64 y.o. male with right leg numbness and right foot weakness to plantar flexion.  Consistent with S1 dermatomal pattern.  Patient had surgery to decompress the S1 nerve root at the right L5-S1 neuroforamen August 2020.  This helped some of his pain but he still has persistent weakness and numbness.  Plan for x-ray MRI and physical therapy.  Recheck after MRI.   PDMP not reviewed this encounter. Orders Placed This Encounter  Procedures  . DG Lumbar Spine 2-3 Views    Standing Status:   Future    Standing Expiration Date:   11/23/2020    Order Specific Question:   Reason for Exam (SYMPTOM  OR DIAGNOSIS REQUIRED)    Answer:   eval right leg weakness. Prior back surgery    Order Specific Question:   Preferred imaging location?    Answer:   [facility]  . Ambulatory referral to Physical Therapy    Referral Priority:   Routine    Referral Type:   Physical Medicine    Referral Reason:   Specialty Services Required    Requested Specialty:   Physical Therapy   No orders of the defined types were placed in this encounter.    Discussed warning signs or symptoms. Please see discharge instructions. Patient expresses understanding.   The above documentation has been reviewed and is accurate and complete Clementeen Graham, M.D.

## 2019-11-26 NOTE — Progress Notes (Signed)
X-ray lumbar spine shows medium arthritis at L5-S1 with some areas of spondylolisthesis for the vertebrae can shift a little bit.  These are stable appearing have not changed much.  We will see this much better on MRI in the near future.

## 2019-12-01 LAB — HM COLONOSCOPY

## 2019-12-03 ENCOUNTER — Telehealth: Payer: Self-pay | Admitting: Family Medicine

## 2019-12-03 MED ORDER — DIAZEPAM 5 MG PO TABS
ORAL_TABLET | ORAL | 0 refills | Status: DC
Start: 2019-12-03 — End: 2020-08-10

## 2019-12-03 NOTE — Telephone Encounter (Signed)
Pt MRI scheduled for 11/6. Pt requesting valium ( has used in the past ).  Usually takes 2, 1 before and 1 upon arrival  [facility] [address]  Please call pt to confirm

## 2019-12-03 NOTE — Telephone Encounter (Signed)
Valium Rx sent to pharmacy

## 2019-12-03 NOTE — Telephone Encounter (Signed)
Spoke with pt, confirmed RX called in.

## 2019-12-04 ENCOUNTER — Other Ambulatory Visit: Payer: Self-pay

## 2019-12-05 ENCOUNTER — Other Ambulatory Visit: Payer: Self-pay

## 2019-12-05 ENCOUNTER — Ambulatory Visit (INDEPENDENT_AMBULATORY_CARE_PROVIDER_SITE_OTHER): Payer: Medicare Other

## 2019-12-05 DIAGNOSIS — M48061 Spinal stenosis, lumbar region without neurogenic claudication: Secondary | ICD-10-CM | POA: Diagnosis not present

## 2019-12-05 DIAGNOSIS — M5136 Other intervertebral disc degeneration, lumbar region: Secondary | ICD-10-CM | POA: Diagnosis not present

## 2019-12-05 DIAGNOSIS — M5126 Other intervertebral disc displacement, lumbar region: Secondary | ICD-10-CM | POA: Diagnosis not present

## 2019-12-05 DIAGNOSIS — Z9889 Other specified postprocedural states: Secondary | ICD-10-CM | POA: Diagnosis not present

## 2019-12-05 DIAGNOSIS — R29898 Other symptoms and signs involving the musculoskeletal system: Secondary | ICD-10-CM

## 2019-12-07 NOTE — Progress Notes (Signed)
MRI shows postsurgical changes at L5-S1 with but not obviously pinched nerve at the area that should cause her weakness.  You do have some medium narrowing at L3-4 and L4-5.  Recommend return to clinic to discuss MRI results in further detail and discuss next step and plan.

## 2019-12-08 ENCOUNTER — Other Ambulatory Visit: Payer: Self-pay | Admitting: Family Medicine

## 2019-12-08 NOTE — Telephone Encounter (Signed)
Last OV 05/18/19 Last fill 05/18/19  #90/1

## 2019-12-08 NOTE — Progress Notes (Signed)
Eric Duran is a 64 y.o. male who presents to [facility] at [facility] today for f/u of LBP and R LE weakness and paresthesias and to review his lumbar MRI.  He was last seen by Dr. Denyse Amass on 11/24/19 and was referred to PT and for an MRI.  He has a hx of 2 prior back surgeries w/ the most recent being in Aug 2020 (R L5-S1 laminotomy, medial facetectomy, foraminotomy and lateral recess decompression).  Since his last visit, pt reports back is doing the same. Some days better than others. Today it's feeling sore.  Diagnostic testing: L-spine MRI- 12/05/19; L-spine XR- 11/24/19  Pertinent review of systems: no fever or chills  Relevant historical information: History of back surgeries as above.  Prediabetes.   Exam:  BP 138/78 (BP Location: Right Arm, Patient Position: Sitting, Cuff Size: Normal)   Pulse 73   Ht 6' 1.25" (1.861 m)   Wt 234 lb 9.6 oz (106.4 kg)   SpO2 98%   BMI 30.74 kg/m  General: Well Developed, well nourished, and in no acute distress.   MSK: Normal gait     Lab and Radiology Results No results found for this or any previous visit (from the past 72 hour(s)). MR Lumbar Spine Wo Contrast  Result Date: 12/06/2019 CLINICAL DATA:  Low back pain. Bilateral posterior leg numbness. Prior back surgery. EXAM: MRI LUMBAR SPINE WITHOUT CONTRAST TECHNIQUE: Multiplanar, multisequence MR imaging of the lumbar spine was performed. No intravenous contrast was administered. COMPARISON:  Lumbar radiographs 11/24/2019.  Lumbar MRI 10/19/2017 FINDINGS: Segmentation:  Normal. Alignment:  Mild lumbar kyphosis. Mild retrolisthesis L3-4, L4-5, L5-S1. Vertebrae:  Normal bone marrow.  Negative for fracture or mass. Conus medullaris and cauda equina: Conus extends to the L1-2 level. Conus and cauda equina appear normal. Paraspinal and other soft tissues: Negative for paraspinous mass, adenopathy, or fluid collection. Disc levels: L1-2: Mild disc bulging and mild facet  degeneration. Negative for stenosis L2-3: Mild disc and mild facet degeneration. Negative for disc protrusion. Mild subarticular stenosis bilaterally. L3-4: Diffuse disc bulging with endplate spurring. Bilateral facet degeneration. Moderate subarticular stenosis bilaterally right greater than left unchanged. Spinal canal adequate in size L4-5: Interval laminectomy and resection of extruded disc fragment in the midline. No recurrent disc protrusion. Diffuse endplate spurring remains with mild to moderate subarticular stenosis bilaterally. Spinal canal adequately decompressed. L5-S1: Right laminectomy with decompression of the right S1 nerve root. Mild facet degeneration bilaterally. Advanced disc degeneration with diffuse endplate spurring causing mild subarticular stenosis bilaterally. 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 Moderate subarticular stenosis bilaterally L3-4 unchanged Interval laminectomy and discectomy at L4-5. No recurrent disc protrusion. Mild to moderate subarticular stenosis bilaterally Right laminectomy L5-S1. Diffuse endplate spurring causing mild subarticular stenosis bilaterally Electronically Signed   By: Marlan Palau M.D.   On: 12/06/2019 13:30   The above documentation has been reviewed and is accurate and complete Clementeen Graham, M.D. Decompressed S1 nerve root     Assessment and Plan: 64 y.o. male with right leg weakness and minimal paresthesias.  Surgery was over a year ago and based on updated MRI looks like it was pretty successful in decompressing the S1 nerve root.  At this point I think the residual weakness that he has is likely secondary to the prior injury prior to surgery.  He had enough time to have pretty good improvement and still having some symptoms. He has not had his trial of physical therapy yet.  I  think that is totally reasonable approach as I think it is going to improve his functional capacity and quality of life.  Plan for PT.   Also talked about the possibility of metabolic evaluation.  He does have prediabetes with most recent blood sugar assessment over a year ago.  He should have updated visit with PCP to reassess diabetes.  B12 was checked 7 years ago and was normal.  Unlikely to be abnormal now.  Recheck back with me as needed.  Could possibly proceed with CT myelogram or nerve conduction study in the future but I am not optimistic about the studies providing more information that will change plan.   PDMP not reviewed this encounter. No orders of the defined types were placed in this encounter.  No orders of the defined types were placed in this encounter.    Discussed warning signs or symptoms. Please see discharge instructions. Patient expresses understanding.   The above documentation has been reviewed and is accurate and complete Clementeen Graham, M.D.  Total encounter time 20 minutes including face-to-face time with the patient and, reviewing past medical record, and charting on the date of service.   MRI results and treatment plan and options.

## 2019-12-09 ENCOUNTER — Ambulatory Visit (INDEPENDENT_AMBULATORY_CARE_PROVIDER_SITE_OTHER): Payer: Medicare Other | Admitting: Family Medicine

## 2019-12-09 ENCOUNTER — Encounter: Payer: Self-pay | Admitting: Family Medicine

## 2019-12-09 ENCOUNTER — Other Ambulatory Visit: Payer: Self-pay

## 2019-12-09 VITALS — BP 138/78 | HR 73 | Ht 73.25 in | Wt 234.6 lb

## 2019-12-09 DIAGNOSIS — R29898 Other symptoms and signs involving the musculoskeletal system: Secondary | ICD-10-CM | POA: Diagnosis not present

## 2019-12-09 DIAGNOSIS — Z9889 Other specified postprocedural states: Secondary | ICD-10-CM

## 2019-12-09 NOTE — Patient Instructions (Signed)
Thank you for coming in today.  Call and set up PT.  [facility].  Let me know if you have a problem.   Consider recheck blood sugar soonish to make sure your prediabetes has not progressed.   I expect that this will get some better hopefully a lot better.   There are more tests to consider but I am not very optimistic about them.

## 2019-12-29 ENCOUNTER — Other Ambulatory Visit: Payer: Self-pay

## 2019-12-29 ENCOUNTER — Encounter: Payer: Self-pay | Admitting: Physical Therapy

## 2019-12-29 ENCOUNTER — Ambulatory Visit (INDEPENDENT_AMBULATORY_CARE_PROVIDER_SITE_OTHER): Payer: Medicare Other | Admitting: Physical Therapy

## 2019-12-29 DIAGNOSIS — M5416 Radiculopathy, lumbar region: Secondary | ICD-10-CM

## 2019-12-29 DIAGNOSIS — M6281 Muscle weakness (generalized): Secondary | ICD-10-CM

## 2019-12-29 DIAGNOSIS — R2689 Other abnormalities of gait and mobility: Secondary | ICD-10-CM | POA: Diagnosis not present

## 2019-12-29 NOTE — Patient Instructions (Signed)
Access Code: [email or web address] URL: [email or web address] Date: 12/29/2019 Prepared by: Sedalia Muta  Exercises Supine Single Knee to Chest Stretch - 2 x daily - 3 reps - 30 hold Seated Hamstring Stretch - 2 x daily - 3 reps - 30 hold Supine Posterior Pelvic Tilt - 2 x daily - 1 sets - 10 reps Supine March - 1 x daily - 2 sets - 10 reps Stride Stance Weight Shift - 1 x daily - 2 sets - 10 reps Forward Backward Weight Shift with Counter Support - 1 x daily - 2 sets - 10 reps Seated Heel Raise - 2 x daily - 2 sets - 10 reps

## 2019-12-30 NOTE — Therapy (Signed)
[facility] [address] [place], [place], [address] Phone: [phone]   Fax:  [phone]  Physical Therapy Evaluation  Patient Details  Name: Eric Duran MRN: [id number] Date of Birth: July 02, 1955 Referring Provider (PT): Clementeen Graham   Encounter Date: 12/29/2019   PT End of Session - 12/29/19 1309    Visit Number 1    Number of Visits 12    Date for PT Re-Evaluation 02/09/20    Authorization Type Medicare    PT Start Time 1023    PT Stop Time 1105    PT Time Calculation (min) 42 min    Activity Tolerance Patient tolerated treatment well    Behavior During Therapy [facility] for tasks assessed/performed           Past Medical History:  Diagnosis Date  . ANEMIA, IRON DEFICIENCY 07/22/2009  . ASTHMA 07/22/2009   mild  . GERD 07/22/2009  . HYPERLIPIDEMIA 07/22/2009  . Kidney stones 2001   6 times in past  . OTITIS MEDIA, SUPPURATIVE 11/09/2009  . PREDIABETES 07/22/2009   pt does not monitor cbg at home    Past Surgical History:  Procedure Laterality Date  . BIOPSY BOWEL  1982   exploration bowel surgery, chrons disease  . CHOLECYSTECTOMY  02/26/2011   Procedure: LAPAROSCOPIC CHOLECYSTECTOMY WITH INTRAOPERATIVE CHOLANGIOGRAM;  Surgeon: Velora Heckler, MD;  Location: [facility];  Service: General;  Laterality: N/A;  . colonscopy  2012 and 2006  . HERNIA REPAIR  2005 - approx  . TONSILLECTOMY  1960 - approx    There were no vitals filed for this visit.    Subjective Assessment - 12/29/19 1301    Subjective Pt had bil LE pain last year. Had back surgery in Jan and Aug of 2020. L side/LE now fine, R still numb and weak feeling. Numbness into posterior LE into foot. States weakness with plantar flexion. Was a runner previously, has not done much exercise since surgeries. Is retired. Pt worried about his balance, due to weakness.    Currently in Pain? Yes    Pain Score 3     Pain Location Leg    Pain Orientation Right    Pain  Descriptors / Indicators Numbness;Discomfort    Pain Type Acute pain    Pain Onset More than a month ago    Pain Frequency Intermittent              [facility] PT Assessment - 12/30/19 0001      Assessment   Medical Diagnosis R LE weakness    Referring Provider (PT) Clementeen Graham    Prior Therapy no      Precautions   Precaution Comments Fall risk due to weakness in R foot      Balance Screen   Has the patient fallen in the past 6 months No    Has the patient had a decrease in activity level because of a fear of falling?  Yes    Is the patient reluctant to leave their home because of a fear of falling?  No      Prior Function   Level of Independence Independent      Cognition   Overall Cognitive Status Within Functional Limits for tasks assessed      ROM / Strength   AROM / PROM / Strength AROM;Strength      AROM   Overall AROM Comments R hip flexion mild limiation due to pain/spasm,  Other hip/knee: WFL;  Ankle PROM:  WNL,  R ankle PF limited due to weakness.     AROM Assessment Site Lumbar    Lumbar Flexion mod limitation    Lumbar Extension mod limitation    Lumbar - Right Side Bend mild limitation    Lumbar - Left Side Bend mild limitation      Strength   Overall Strength Comments R plantar flexion: unable to do standing double leg heel raise, Able to do seated HR.     Strength Assessment Site Ankle;Hip;Knee    Right/Left Hip Right    Right Hip Flexion 4/5    Right Hip External Rotation  4/5    Right Hip ABduction 4/5    Right/Left Knee Right    Right Knee Flexion 4/5    Right Knee Extension 4+/5    Right/Left Ankle Right    Right Ankle Dorsiflexion 4+/5    Right Ankle Plantar Flexion 3/5    Right Ankle Inversion 4-/5    Right Ankle Eversion 4-/5      Palpation   Palpation comment no pain in lumbar spine with palpation       Special Tests   Other special tests Will test SL balance next visit                       Objective measurements  completed on examination: See above findings.       [facility] Adult PT Treatment/Exercise - 12/30/19 0001      Exercises   Exercises Lumbar      Lumbar Exercises: Stretches   Active Hamstring Stretch 2 reps;30 seconds    Active Hamstring Stretch Limitations seated    Single Knee to Chest Stretch 3 reps;30 seconds    Single Knee to Chest Stretch Limitations light    Pelvic Tilt 15 reps      Lumbar Exercises: Standing   Other Standing Lumbar Exercises staggered stance weight shifts x 10 bil;       Lumbar Exercises: Seated   Other Seated Lumbar Exercises Seated HR x 15 R       Lumbar Exercises: Supine   Bent Knee Raise 20 reps                    PT Short Term Goals - 12/29/19 1314      PT SHORT TERM GOAL #1   Title Pt to be independent with initial HEP    Time 2    Period Weeks    Status New    Target Date 01/12/20             PT Long Term Goals - 12/29/19 1338      PT LONG TERM GOAL #1   Title Pt to demo improved strength of R LE up to 5/5 for improved stability.    Time 6    Period Weeks    Status New    Target Date 02/09/20      PT LONG TERM GOAL #2   Title Pt to demo improved static and dynamic balance to be WNL for improved safety with navigation    Time 6    Period Weeks    Status New    Target Date 02/09/20      PT LONG TERM GOAL #3   Title Pt to report ability for activity and exercise for at least 30 min, without increased pain in LE.    Time 6    Period Weeks    Status New  Target Date 02/09/20      PT LONG TERM GOAL #4   Title Pt to be independent wtih final HEP    Time 6    Period Weeks    Status New    Target Date 02/09/20                  Plan - 12/30/19 1025    Clinical Impression Statement Pt presents with primary complaint of weakness in R LE. He has had previous back surgeries (2) with little relief of R LE symptoms. He has weakness in R vs L, for hip, knee and ankle, mostly for plantar flexion. Weakness is  causing decreased balance and stability as well as diffiuclty with walking. Pt to benefit from skilled PT to improve deficits and pain.    Personal Factors and Comorbidities Comorbidity 1    Comorbidities previous lumbar surgery, LE weakness    Examination-Activity Limitations Stand;Squat;Stairs    Examination-Participation Restrictions Cleaning;Yard Work;Community Activity;Shop    Stability/Clinical Decision Making Evolving/Moderate complexity    Clinical Decision Making Moderate    Rehab Potential Fair    PT Frequency 2x / week    PT Duration 6 weeks    PT Treatment/Interventions ADLs/Self Care Home Management;Cryotherapy;Electrical Stimulation;Ultrasound;Traction;Moist Heat;Iontophoresis 4mg /ml Dexamethasone;Gait training;Stair training;Functional mobility training;Therapeutic activities;Therapeutic exercise;Balance training;Neuromuscular re-education;Patient/family education;Manual techniques;Passive range of motion;Dry needling;Taping;Vasopneumatic Device;Joint Manipulations;Spinal Manipulations;Orthotic Fit/Training    Consulted and Agree with Plan of Care Patient           Patient will benefit from skilled therapeutic intervention in order to improve the following deficits and impairments:  Pain, Decreased mobility, Decreased strength, Decreased activity tolerance, Decreased balance, Difficulty walking, Decreased range of motion  Visit Diagnosis: Muscle weakness (generalized)  Radiculopathy, lumbar region  Other abnormalities of gait and mobility     Problem List Patient Active Problem List   Diagnosis Date Noted  . Previous back surgery 11/24/2019  . Hypertension 08/20/2016  . Gallstones 02/22/2011  . Abdominal pain, right upper quadrant 02/22/2011  . Crohn's 02/13/2011  . OTITIS MEDIA, SUPPURATIVE 11/09/2009  . Dyslipidemia 07/22/2009  . ANEMIA, IRON DEFICIENCY 07/22/2009  . ASTHMA 07/22/2009  . GERD 07/22/2009  . PREDIABETES 07/22/2009  . NONSPECIFIC ABNORM  RESULTS KIDNEY FUNCTION STUDY 07/22/2009    07/24/2009, PT, DPT 10:39 AM  12/30/19    [facility] [address] [place], [place], [place] Phone: [phone]   Fax:  [phone]  Name: Eric Duran MRN: Myrtis Hopping Date of Birth: [birth year]

## 2019-12-31 ENCOUNTER — Encounter: Payer: Self-pay | Admitting: Physical Therapy

## 2019-12-31 ENCOUNTER — Ambulatory Visit (INDEPENDENT_AMBULATORY_CARE_PROVIDER_SITE_OTHER): Payer: Medicare Other | Admitting: Physical Therapy

## 2019-12-31 ENCOUNTER — Other Ambulatory Visit: Payer: Self-pay

## 2019-12-31 DIAGNOSIS — M5416 Radiculopathy, lumbar region: Secondary | ICD-10-CM | POA: Diagnosis not present

## 2019-12-31 DIAGNOSIS — R2689 Other abnormalities of gait and mobility: Secondary | ICD-10-CM | POA: Diagnosis not present

## 2019-12-31 DIAGNOSIS — M6281 Muscle weakness (generalized): Secondary | ICD-10-CM | POA: Diagnosis not present

## 2019-12-31 NOTE — Therapy (Signed)
[facility] [address] [place], [place], [address] Phone: [phone]   Fax:  [phone]  Physical Therapy Treatment  Patient Details  Name: DOSSIE SWOR MRN: [id number] Date of Birth: Nov 25, 1955 Referring Provider (PT): Clementeen Graham   Encounter Date: 12/31/2019   PT End of Session - 12/31/19 1610    Visit Number 2    Number of Visits 12    Date for PT Re-Evaluation 02/09/20    Authorization Type Medicare    PT Start Time 1216    PT Stop Time 1257    PT Time Calculation (min) 41 min    Activity Tolerance Patient tolerated treatment well    Behavior During Therapy [facility] for tasks assessed/performed           Past Medical History:  Diagnosis Date  . ANEMIA, IRON DEFICIENCY 07/22/2009  . ASTHMA 07/22/2009   mild  . GERD 07/22/2009  . HYPERLIPIDEMIA 07/22/2009  . Kidney stones 2001   6 times in past  . OTITIS MEDIA, SUPPURATIVE 11/09/2009  . PREDIABETES 07/22/2009   pt does not monitor cbg at home    Past Surgical History:  Procedure Laterality Date  . BIOPSY BOWEL  1982   exploration bowel surgery, chrons disease  . CHOLECYSTECTOMY  02/26/2011   Procedure: LAPAROSCOPIC CHOLECYSTECTOMY WITH INTRAOPERATIVE CHOLANGIOGRAM;  Surgeon: Velora Heckler, MD;  Location: [facility];  Service: General;  Laterality: N/A;  . colonscopy  2012 and 2006  . HERNIA REPAIR  2005 - approx  . TONSILLECTOMY  1960 - approx    There were no vitals filed for this visit.   Subjective Assessment - 12/31/19 1609    Subjective Pt reports doing HEP    Currently in Pain? Yes    Pain Score 3     Pain Location Leg    Pain Orientation Right    Pain Descriptors / Indicators Discomfort;Numbness    Pain Type Acute pain    Pain Onset More than a month ago    Pain Frequency Intermittent                             [facility] Adult PT Treatment/Exercise - 12/31/19 0001      Exercises   Exercises Lumbar      Lumbar Exercises: Stretches    Active Hamstring Stretch 2 reps;30 seconds    Active Hamstring Stretch Limitations seated    Single Knee to Chest Stretch 3 reps;30 seconds    Single Knee to Chest Stretch Limitations --    Pelvic Tilt 15 reps      Lumbar Exercises: Aerobic   Recumbent Bike L 2 x 8 min;       Lumbar Exercises: Standing   Functional Squats 20 reps    Functional Squats Limitations education on hip hinge motion/ mini squats     Other Standing Lumbar Exercises staggered stance weight shifts x 15 bil; A/P weight shifts x 20;     Other Standing Lumbar Exercises March x 20;       Lumbar Exercises: Seated   Long Arc Quad on Chair 20 reps    LAQ on Chair Weights (lbs) 3    Other Seated Lumbar Exercises Seated HR x 15 bil, no shoes ; Long sitting: PF with YTB 2 x 10;     Other Seated Lumbar Exercises HSC x 20 GTB;       Lumbar Exercises: Supine   Bent Knee Raise  20 reps    Bent Knee Raise Limitations with TA    Straight Leg Raise 10 reps    Straight Leg Raises Limitations bil;                     PT Short Term Goals - 12/29/19 1314      PT SHORT TERM GOAL #1   Title Pt to be independent with initial HEP    Time 2    Period Weeks    Status New    Target Date 01/12/20             PT Long Term Goals - 12/29/19 1338      PT LONG TERM GOAL #1   Title Pt to demo improved strength of R LE up to 5/5 for improved stability.    Time 6    Period Weeks    Status New    Target Date 02/09/20      PT LONG TERM GOAL #2   Title Pt to demo improved static and dynamic balance to be WNL for improved safety with navigation    Time 6    Period Weeks    Status New    Target Date 02/09/20      PT LONG TERM GOAL #3   Title Pt to report ability for activity and exercise for at least 30 min, without increased pain in LE.    Time 6    Period Weeks    Status New    Target Date 02/09/20      PT LONG TERM GOAL #4   Title Pt to be independent wtih final HEP    Time 6    Period Weeks    Status  New    Target Date 02/09/20                 Plan - 12/31/19 1611    Clinical Impression Statement Pt with good ability for ther ex progression today. Difficulty with PF due to weakness. HEP updated and reviewed. Plan to progress strength and balance as tolerated.    Personal Factors and Comorbidities Comorbidity 1    Comorbidities previous lumbar surgery, LE weakness    Examination-Activity Limitations Stand;Squat;Stairs    Examination-Participation Restrictions Cleaning;Yard Work;Community Activity;Shop    Stability/Clinical Decision Making Evolving/Moderate complexity    Rehab Potential Fair    PT Frequency 2x / week    PT Duration 6 weeks    PT Treatment/Interventions ADLs/Self Care Home Management;Cryotherapy;Electrical Stimulation;Ultrasound;Traction;Moist Heat;Iontophoresis 4mg /ml Dexamethasone;Gait training;Stair training;Functional mobility training;Therapeutic activities;Therapeutic exercise;Balance training;Neuromuscular re-education;Patient/family education;Manual techniques;Passive range of motion;Dry needling;Taping;Vasopneumatic Device;Joint Manipulations;Spinal Manipulations;Orthotic Fit/Training    Consulted and Agree with Plan of Care Patient           Patient will benefit from skilled therapeutic intervention in order to improve the following deficits and impairments:  Pain, Decreased mobility, Decreased strength, Decreased activity tolerance, Decreased balance, Difficulty walking, Decreased range of motion  Visit Diagnosis: Muscle weakness (generalized)  Radiculopathy, lumbar region  Other abnormalities of gait and mobility     Problem List Patient Active Problem List   Diagnosis Date Noted  . Previous back surgery 11/24/2019  . Hypertension 08/20/2016  . Gallstones 02/22/2011  . Abdominal pain, right upper quadrant 02/22/2011  . Crohn's 02/13/2011  . OTITIS MEDIA, SUPPURATIVE 11/09/2009  . Dyslipidemia 07/22/2009  . ANEMIA, IRON DEFICIENCY  07/22/2009  . ASTHMA 07/22/2009  . GERD 07/22/2009  . PREDIABETES 07/22/2009  . NONSPECIFIC ABNORM RESULTS KIDNEY FUNCTION STUDY  07/22/2009    Sedalia Muta, PT, DPT 4:12 PM  12/31/19      [facility] [address] [place], [place], [address] Phone: [phone]   Fax:  [phone]  Name: OLIVE MOTYKA MRN: [id number] Date of Birth: 05/31/1955

## 2020-01-04 ENCOUNTER — Encounter: Payer: Self-pay | Admitting: Physical Therapy

## 2020-01-04 ENCOUNTER — Ambulatory Visit (INDEPENDENT_AMBULATORY_CARE_PROVIDER_SITE_OTHER): Payer: Medicare Other | Admitting: Physical Therapy

## 2020-01-04 ENCOUNTER — Other Ambulatory Visit: Payer: Self-pay

## 2020-01-04 DIAGNOSIS — R2689 Other abnormalities of gait and mobility: Secondary | ICD-10-CM

## 2020-01-04 DIAGNOSIS — M6281 Muscle weakness (generalized): Secondary | ICD-10-CM | POA: Diagnosis not present

## 2020-01-04 DIAGNOSIS — M5416 Radiculopathy, lumbar region: Secondary | ICD-10-CM | POA: Diagnosis not present

## 2020-01-04 NOTE — Therapy (Signed)
[facility] [address] [place], [place], [address] Phone: [phone]   Fax:  [phone]  Physical Therapy Treatment  Patient Details  Name: Eric Duran MRN: [id number] Date of Birth: May 11, 1955 Referring Provider (PT): Clementeen Graham   Encounter Date: 01/04/2020   PT End of Session - 01/04/20 0942    Visit Number 3    Number of Visits 12    Date for PT Re-Evaluation 02/09/20    Authorization Type Medicare    PT Start Time 0935    PT Stop Time 1013    PT Time Calculation (min) 38 min    Activity Tolerance Patient tolerated treatment well    Behavior During Therapy [facility] for tasks assessed/performed           Past Medical History:  Diagnosis Date  . ANEMIA, IRON DEFICIENCY 07/22/2009  . ASTHMA 07/22/2009   mild  . GERD 07/22/2009  . HYPERLIPIDEMIA 07/22/2009  . Kidney stones 2001   6 times in past  . OTITIS MEDIA, SUPPURATIVE 11/09/2009  . PREDIABETES 07/22/2009   pt does not monitor cbg at home    Past Surgical History:  Procedure Laterality Date  . BIOPSY BOWEL  1982   exploration bowel surgery, chrons disease  . CHOLECYSTECTOMY  02/26/2011   Procedure: LAPAROSCOPIC CHOLECYSTECTOMY WITH INTRAOPERATIVE CHOLANGIOGRAM;  Surgeon: Velora Heckler, MD;  Location: [facility];  Service: General;  Laterality: N/A;  . colonscopy  2012 and 2006  . HERNIA REPAIR  2005 - approx  . TONSILLECTOMY  1960 - approx    There were no vitals filed for this visit.   Subjective Assessment - 01/04/20 0940    Subjective Pt with no new complaints. Has been doing HEP    Currently in Pain? Yes    Pain Score 3     Pain Location Leg    Pain Orientation Right    Pain Descriptors / Indicators Numbness;Discomfort    Pain Type Acute pain    Pain Onset More than a month ago    Pain Frequency Intermittent                             [facility] Adult PT Treatment/Exercise - 01/04/20 0943      Exercises   Exercises Lumbar      Lumbar  Exercises: Stretches   Active Hamstring Stretch 2 reps;30 seconds    Active Hamstring Stretch Limitations seated    Single Knee to Chest Stretch --    Pelvic Tilt --      Lumbar Exercises: Aerobic   Recumbent Bike L 2 x 8 min;       Lumbar Exercises: Standing   Heel Raises 10 reps    Functional Squats 20 reps    Functional Squats Limitations education on hip hinge motion/ mini squats     Lifting 5 reps;From 12"    Lifting Limitations no weight, education on bending mechanics for IADLS.     Other Standing Lumbar Exercises staggered stance weight shifts x 15 bil; A/P weight shifts x 20;  TOe taps on step 6 in x 20;  Stairs, up/down 5 steps 6 in, recipricol, 1 HR. x 6;     Other Standing Lumbar Exercises March x 20; Lateral stepping/lunge x 15 bil;       Lumbar Exercises: Seated   Long Arc Quad on Chair 20 reps    LAQ on Chair Weights (lbs) 3  Other Seated Lumbar Exercises Seated HR x 15 bil, no shoes ; Long sitting: PF with YTB 2 x 10;     Other Seated Lumbar Exercises HSC x 20 GTB;       Lumbar Exercises: Supine   Bent Knee Raise --    Bent Knee Raise Limitations --    Straight Leg Raise --    Straight Leg Raises Limitations --                    PT Short Term Goals - 12/29/19 1314      PT SHORT TERM GOAL #1   Title Pt to be independent with initial HEP    Time 2    Period Weeks    Status New    Target Date 01/12/20             PT Long Term Goals - 12/29/19 1338      PT LONG TERM GOAL #1   Title Pt to demo improved strength of R LE up to 5/5 for improved stability.    Time 6    Period Weeks    Status New    Target Date 02/09/20      PT LONG TERM GOAL #2   Title Pt to demo improved static and dynamic balance to be WNL for improved safety with navigation    Time 6    Period Weeks    Status New    Target Date 02/09/20      PT LONG TERM GOAL #3   Title Pt to report ability for activity and exercise for at least 30 min, without increased pain in  LE.    Time 6    Period Weeks    Status New    Target Date 02/09/20      PT LONG TERM GOAL #4   Title Pt to be independent wtih final HEP    Time 6    Period Weeks    Status New    Target Date 02/09/20                 Plan - 01/04/20 1213    Clinical Impression Statement Focus on LE strength, and NMR for improving confidence with balance and dynamic activity. Education on Artist for light objects and IADLS for back positioning. Plan to progress strength and stability as tolerated.    Personal Factors and Comorbidities Comorbidity 1    Comorbidities previous lumbar surgery, LE weakness    Examination-Activity Limitations Stand;Squat;Stairs    Examination-Participation Restrictions Cleaning;Yard Work;Community Activity;Shop    Stability/Clinical Decision Making Evolving/Moderate complexity    Rehab Potential Fair    PT Frequency 2x / week    PT Duration 6 weeks    PT Treatment/Interventions ADLs/Self Care Home Management;Cryotherapy;Electrical Stimulation;Ultrasound;Traction;Moist Heat;Iontophoresis 4mg /ml Dexamethasone;Gait training;Stair training;Functional mobility training;Therapeutic activities;Therapeutic exercise;Balance training;Neuromuscular re-education;Patient/family education;Manual techniques;Passive range of motion;Dry needling;Taping;Vasopneumatic Device;Joint Manipulations;Spinal Manipulations;Orthotic Fit/Training    Consulted and Agree with Plan of Care Patient           Patient will benefit from skilled therapeutic intervention in order to improve the following deficits and impairments:  Pain, Decreased mobility, Decreased strength, Decreased activity tolerance, Decreased balance, Difficulty walking, Decreased range of motion  Visit Diagnosis: Muscle weakness (generalized)  Radiculopathy, lumbar region  Other abnormalities of gait and mobility     Problem List Patient Active Problem List   Diagnosis Date Noted  . Previous back  surgery 11/24/2019  . Hypertension 08/20/2016  . Gallstones  02/22/2011  . Abdominal pain, right upper quadrant 02/22/2011  . Crohn's 02/13/2011  . OTITIS MEDIA, SUPPURATIVE 11/09/2009  . Dyslipidemia 07/22/2009  . ANEMIA, IRON DEFICIENCY 07/22/2009  . ASTHMA 07/22/2009  . GERD 07/22/2009  . PREDIABETES 07/22/2009  . NONSPECIFIC ABNORM RESULTS KIDNEY FUNCTION STUDY 07/22/2009   Sedalia Muta, PT, DPT 12:15 PM  01/04/20    [place] [facility] [address] [place], [place], [address] Phone: [phone]   Fax:  [phone]  Name: Eric Duran MRN: [id number] Date of Birth: [birth year]

## 2020-01-06 ENCOUNTER — Ambulatory Visit (INDEPENDENT_AMBULATORY_CARE_PROVIDER_SITE_OTHER): Payer: Medicare Other | Admitting: Physical Therapy

## 2020-01-06 ENCOUNTER — Other Ambulatory Visit: Payer: Self-pay

## 2020-01-06 ENCOUNTER — Encounter: Payer: Self-pay | Admitting: Physical Therapy

## 2020-01-06 DIAGNOSIS — M5416 Radiculopathy, lumbar region: Secondary | ICD-10-CM | POA: Diagnosis not present

## 2020-01-06 DIAGNOSIS — M6281 Muscle weakness (generalized): Secondary | ICD-10-CM | POA: Diagnosis not present

## 2020-01-06 DIAGNOSIS — R2689 Other abnormalities of gait and mobility: Secondary | ICD-10-CM

## 2020-01-06 NOTE — Therapy (Signed)
[facility] [address] [place], [place], [address] Phone: [phone]   Fax:  [phone]  Physical Therapy Treatment  Patient Details  Name: Eric Duran MRN: [id number] Date of Birth: 1955-05-24 Referring Provider (PT): Clementeen Graham   Encounter Date: 01/06/2020   PT End of Session - 01/06/20 1019    Visit Number 4    Number of Visits 12    Date for PT Re-Evaluation 02/09/20    Authorization Type Medicare    PT Start Time 1014    PT Stop Time 1058    PT Time Calculation (min) 44 min    Activity Tolerance Patient tolerated treatment well    Behavior During Therapy [facility] for tasks assessed/performed           Past Medical History:  Diagnosis Date  . ANEMIA, IRON DEFICIENCY 07/22/2009  . ASTHMA 07/22/2009   mild  . GERD 07/22/2009  . HYPERLIPIDEMIA 07/22/2009  . Kidney stones 2001   6 times in past  . OTITIS MEDIA, SUPPURATIVE 11/09/2009  . PREDIABETES 07/22/2009   pt does not monitor cbg at home    Past Surgical History:  Procedure Laterality Date  . BIOPSY BOWEL  1982   exploration bowel surgery, chrons disease  . CHOLECYSTECTOMY  02/26/2011   Procedure: LAPAROSCOPIC CHOLECYSTECTOMY WITH INTRAOPERATIVE CHOLANGIOGRAM;  Surgeon: Velora Heckler, MD;  Location: [facility];  Service: General;  Laterality: N/A;  . colonscopy  2012 and 2006  . HERNIA REPAIR  2005 - approx  . TONSILLECTOMY  1960 - approx    There were no vitals filed for this visit.   Subjective Assessment - 01/06/20 1018    Subjective Pt states he is tired today, but doing well.    Currently in Pain? Yes    Pain Score 3     Pain Location Leg    Pain Orientation Right    Pain Descriptors / Indicators Discomfort;Numbness    Pain Type Acute pain    Pain Onset More than a month ago    Pain Frequency Intermittent                             [facility] Adult PT Treatment/Exercise - 01/06/20 1020      Exercises   Exercises Lumbar      Lumbar  Exercises: Stretches   Active Hamstring Stretch 2 reps;30 seconds    Active Hamstring Stretch Limitations seated    Single Knee to Chest Stretch 3 reps;30 seconds    Pelvic Tilt 15 reps      Lumbar Exercises: Aerobic   Recumbent Bike L 2 x 8 min;       Lumbar Exercises: Standing   Heel Raises --    Functional Squats 20 reps    Functional Squats Limitations education on hip hinge motion/ mini squats     Lifting --    Lifting Limitations --    Other Standing Lumbar Exercises A/P weight shifts x 20 no shoes ;  Step ups 6 in x 10 bil; ! HR;     Other Standing Lumbar Exercises March/Walk 10 ft x 6;  ; Lateral stepping/lunge x 15 bil;  Bwd walking 20 ft x 4;       Lumbar Exercises: Seated   Long Arc Quad on Chair --    LAQ on Chair Weights (lbs) --    Other Seated Lumbar Exercises Seated HR x 15 bil, no shoes ; Long  sitting: PF with YTB 2 x 10;     Other Seated Lumbar Exercises --      Lumbar Exercises: Supine   Bent Knee Raise 20 reps    Bent Knee Raise Limitations with TA    Bridge 15 reps    Other Supine Lumbar Exercises Clam GTB with TA x 20;                     PT Short Term Goals - 12/29/19 1314      PT SHORT TERM GOAL #1   Title Pt to be independent with initial HEP    Time 2    Period Weeks    Status New    Target Date 01/12/20             PT Long Term Goals - 12/29/19 1338      PT LONG TERM GOAL #1   Title Pt to demo improved strength of R LE up to 5/5 for improved stability.    Time 6    Period Weeks    Status New    Target Date 02/09/20      PT LONG TERM GOAL #2   Title Pt to demo improved static and dynamic balance to be WNL for improved safety with navigation    Time 6    Period Weeks    Status New    Target Date 02/09/20      PT LONG TERM GOAL #3   Title Pt to report ability for activity and exercise for at least 30 min, without increased pain in LE.    Time 6    Period Weeks    Status New    Target Date 02/09/20      PT LONG TERM  GOAL #4   Title Pt to be independent wtih final HEP    Time 6    Period Weeks    Status New    Target Date 02/09/20                 Plan - 01/06/20 1059    Clinical Impression Statement Pt progressing well with improving confidence and stability with dynamic activity and stability. Weakness still limiting stability in R foot. Cuing for watching toes and trying to spread/use more during activity. Noted clawing of toes due to weakness/compensation wtih most weight bearing exerciess.    Personal Factors and Comorbidities Comorbidity 1    Comorbidities previous lumbar surgery, LE weakness    Examination-Activity Limitations Stand;Squat;Stairs    Examination-Participation Restrictions Cleaning;Yard Work;Community Activity;Shop    Stability/Clinical Decision Making Evolving/Moderate complexity    Rehab Potential Fair    PT Frequency 2x / week    PT Duration 6 weeks    PT Treatment/Interventions ADLs/Self Care Home Management;Cryotherapy;Electrical Stimulation;Ultrasound;Traction;Moist Heat;Iontophoresis 4mg /ml Dexamethasone;Gait training;Stair training;Functional mobility training;Therapeutic activities;Therapeutic exercise;Balance training;Neuromuscular re-education;Patient/family education;Manual techniques;Passive range of motion;Dry needling;Taping;Vasopneumatic Device;Joint Manipulations;Spinal Manipulations;Orthotic Fit/Training    Consulted and Agree with Plan of Care Patient           Patient will benefit from skilled therapeutic intervention in order to improve the following deficits and impairments:  Pain, Decreased mobility, Decreased strength, Decreased activity tolerance, Decreased balance, Difficulty walking, Decreased range of motion  Visit Diagnosis: Muscle weakness (generalized)  Radiculopathy, lumbar region  Other abnormalities of gait and mobility     Problem List Patient Active Problem List   Diagnosis Date Noted  . Previous back surgery 11/24/2019  .  Hypertension 08/20/2016  . Gallstones 02/22/2011  .  Abdominal pain, right upper quadrant 02/22/2011  . Crohn's 02/13/2011  . OTITIS MEDIA, SUPPURATIVE 11/09/2009  . Dyslipidemia 07/22/2009  . ANEMIA, IRON DEFICIENCY 07/22/2009  . ASTHMA 07/22/2009  . GERD 07/22/2009  . PREDIABETES 07/22/2009  . NONSPECIFIC ABNORM RESULTS KIDNEY FUNCTION STUDY 07/22/2009    Sedalia Muta, PT, DPT 11:01 AM  01/06/20    [facility] [place] [address] [place], [place], [address] Phone: [phone]   Fax:  [phone]  Name: KALUM MINNER MRN: [id number] Date of Birth: 12/31/1955

## 2020-01-11 ENCOUNTER — Encounter: Payer: Self-pay | Admitting: Physical Therapy

## 2020-01-11 ENCOUNTER — Ambulatory Visit (INDEPENDENT_AMBULATORY_CARE_PROVIDER_SITE_OTHER): Payer: Medicare Other | Admitting: Physical Therapy

## 2020-01-11 ENCOUNTER — Other Ambulatory Visit: Payer: Self-pay

## 2020-01-11 DIAGNOSIS — M6281 Muscle weakness (generalized): Secondary | ICD-10-CM

## 2020-01-11 DIAGNOSIS — M5416 Radiculopathy, lumbar region: Secondary | ICD-10-CM

## 2020-01-11 NOTE — Therapy (Signed)
[facility] [place] [address] [place], [place], [address] Phone: [phone]   Fax:  [phone]  Physical Therapy Treatment  Patient Details  Name: Eric Duran MRN: [id number] Date of Birth: 1955-02-20 Referring Provider (PT): Clementeen Graham   Encounter Date: 01/11/2020   PT End of Session - 01/11/20 0945    Visit Number 5    Number of Visits 12    Date for PT Re-Evaluation 02/09/20    Authorization Type Medicare    PT Start Time 0936    PT Stop Time 1015    PT Time Calculation (min) 39 min    Activity Tolerance Patient tolerated treatment well    Behavior During Therapy [facility] for tasks assessed/performed           Past Medical History:  Diagnosis Date  . ANEMIA, IRON DEFICIENCY 07/22/2009  . ASTHMA 07/22/2009   mild  . GERD 07/22/2009  . HYPERLIPIDEMIA 07/22/2009  . Kidney stones 2001   6 times in past  . OTITIS MEDIA, SUPPURATIVE 11/09/2009  . PREDIABETES 07/22/2009   pt does not monitor cbg at home    Past Surgical History:  Procedure Laterality Date  . BIOPSY BOWEL  1982   exploration bowel surgery, chrons disease  . CHOLECYSTECTOMY  02/26/2011   Procedure: LAPAROSCOPIC CHOLECYSTECTOMY WITH INTRAOPERATIVE CHOLANGIOGRAM;  Surgeon: Velora Heckler, MD;  Location: [facility];  Service: General;  Laterality: N/A;  . colonscopy  2012 and 2006  . HERNIA REPAIR  2005 - approx  . TONSILLECTOMY  1960 - approx    There were no vitals filed for this visit.   Subjective Assessment - 01/11/20 0943    Subjective Pt states improvments. He states no pain, but does have mild numbness, mostly in foot, thinks it is better in back of leg.    Currently in Pain? No/denies    Pain Radiating Towards States numbness, but no pain.                             [facility] Adult PT Treatment/Exercise - 01/11/20 0946      Exercises   Exercises Lumbar      Lumbar Exercises: Stretches   Active Hamstring Stretch 2 reps;30 seconds     Active Hamstring Stretch Limitations seated    Single Knee to Chest Stretch 3 reps;30 seconds    Pelvic Tilt --    Other Lumbar Stretch Exercise seated lumbar flexion, 30 sec x 2;      Lumbar Exercises: Aerobic   Recumbent Bike L 2 x 9 min;      Lumbar Exercises: Standing   Functional Squats 20 reps    Other Standing Lumbar Exercises L/R and A/P weight shifts AIrEx x 20 ;  Step ups 6 in x 10 bil;    Other Standing Lumbar Exercises March/Walk 10 ft x 6;  Tandem stance 30 sec x 3 bil;      Lumbar Exercises: Supine   Bridge 15 reps    Other Supine Lumbar Exercises Clam GTB with TA x 20;                   PT Education - 01/11/20 0944    Education Details Reviewed HEP    Person(s) Educated Patient    Methods Explanation;Demonstration;Verbal cues    Comprehension Verbalized understanding;Returned demonstration;Verbal cues required;Need further instruction            PT Short Term Goals -  12/29/19 1314      PT SHORT TERM GOAL #1   Title Pt to be independent with initial HEP    Time 2    Period Weeks    Status New    Target Date 01/12/20             PT Long Term Goals - 12/29/19 1338      PT LONG TERM GOAL #1   Title Pt to demo improved strength of R LE up to 5/5 for improved stability.    Time 6    Period Weeks    Status New    Target Date 02/09/20      PT LONG TERM GOAL #2   Title Pt to demo improved static and dynamic balance to be WNL for improved safety with navigation    Time 6    Period Weeks    Status New    Target Date 02/09/20      PT LONG TERM GOAL #3   Title Pt to report ability for activity and exercise for at least 30 min, without increased pain in LE.    Time 6    Period Weeks    Status New    Target Date 02/09/20      PT LONG TERM GOAL #4   Title Pt to be independent wtih final HEP    Time 6    Period Weeks    Status New    Target Date 02/09/20                 Plan - 01/11/20 1157    Clinical Impression Statement Pt  progressing wtih strengthening and stability. Still challenged wtih SL and DL stability due to weakness, but does have improved safety. Cuing for trying to have as much weight through ball of foot as able, and that feels safe , tendency for R heel to take most pressure of foot/compensate with standing activites.    Personal Factors and Comorbidities Comorbidity 1    Comorbidities previous lumbar surgery, LE weakness    Examination-Activity Limitations Stand;Squat;Stairs    Examination-Participation Restrictions Cleaning;Yard Work;Community Activity;Shop    Stability/Clinical Decision Making Evolving/Moderate complexity    Rehab Potential Fair    PT Frequency 2x / week    PT Duration 6 weeks    PT Treatment/Interventions ADLs/Self Care Home Management;Cryotherapy;Electrical Stimulation;Ultrasound;Traction;Moist Heat;Iontophoresis 4mg /ml Dexamethasone;Gait training;Stair training;Functional mobility training;Therapeutic activities;Therapeutic exercise;Balance training;Neuromuscular re-education;Patient/family education;Manual techniques;Passive range of motion;Dry needling;Taping;Vasopneumatic Device;Joint Manipulations;Spinal Manipulations;Orthotic Fit/Training    Consulted and Agree with Plan of Care Patient           Patient will benefit from skilled therapeutic intervention in order to improve the following deficits and impairments:  Pain,Decreased mobility,Decreased strength,Decreased activity tolerance,Decreased balance,Difficulty walking,Decreased range of motion  Visit Diagnosis: Muscle weakness (generalized)  Radiculopathy, lumbar region     Problem List Patient Active Problem List   Diagnosis Date Noted  . Previous back surgery 11/24/2019  . Hypertension 08/20/2016  . Gallstones 02/22/2011  . Abdominal pain, right upper quadrant 02/22/2011  . Crohn's 02/13/2011  . OTITIS MEDIA, SUPPURATIVE 11/09/2009  . Dyslipidemia 07/22/2009  . ANEMIA, IRON DEFICIENCY 07/22/2009  .  ASTHMA 07/22/2009  . GERD 07/22/2009  . PREDIABETES 07/22/2009  . NONSPECIFIC ABNORM RESULTS KIDNEY FUNCTION STUDY 07/22/2009    07/24/2009, PT, DPT 11:59 AM  01/11/20    [facility] [address] [place], [place], [place] Phone: [phone]   Fax:  [phone]  Name: Eric Gibeault  Duran MRN: [id number] Date of Birth: January 18, 1956

## 2020-01-14 ENCOUNTER — Other Ambulatory Visit: Payer: Self-pay

## 2020-01-14 ENCOUNTER — Ambulatory Visit (INDEPENDENT_AMBULATORY_CARE_PROVIDER_SITE_OTHER): Payer: Medicare Other | Admitting: Physical Therapy

## 2020-01-14 ENCOUNTER — Encounter: Payer: Self-pay | Admitting: Physical Therapy

## 2020-01-14 DIAGNOSIS — M6281 Muscle weakness (generalized): Secondary | ICD-10-CM

## 2020-01-14 DIAGNOSIS — M5416 Radiculopathy, lumbar region: Secondary | ICD-10-CM

## 2020-01-14 DIAGNOSIS — R2689 Other abnormalities of gait and mobility: Secondary | ICD-10-CM | POA: Diagnosis not present

## 2020-01-14 NOTE — Therapy (Signed)
[facility] [address] [place], [place], [address] Phone: [phone]   Fax:  [phone]  Physical Therapy Treatment  Patient Details  Name: Eric Duran MRN: [id number] Date of Birth: [birth year] Referring Provider (PT): Clementeen Graham   Encounter Date: 01/14/2020   PT End of Session - 01/14/20 1104    Visit Number 6    Number of Visits 12    Date for PT Re-Evaluation 02/09/20    Authorization Type Medicare    PT Start Time 1016    PT Stop Time 1055    PT Time Calculation (min) 39 min    Activity Tolerance Patient tolerated treatment well    Behavior During Therapy [facility] for tasks assessed/performed           Past Medical History:  Diagnosis Date   ANEMIA, IRON DEFICIENCY 07/22/2009   ASTHMA 07/22/2009   mild   GERD 07/22/2009   HYPERLIPIDEMIA 07/22/2009   Kidney stones 2001   6 times in past   OTITIS MEDIA, SUPPURATIVE 11/09/2009   PREDIABETES 07/22/2009   pt does not monitor cbg at home    Past Surgical History:  Procedure Laterality Date   BIOPSY BOWEL  1982   exploration bowel surgery, chrons disease   CHOLECYSTECTOMY  02/26/2011   Procedure: LAPAROSCOPIC CHOLECYSTECTOMY WITH INTRAOPERATIVE CHOLANGIOGRAM;  Surgeon: Velora Heckler, MD;  Location: [facility];  Service: General;  Laterality: N/A;   colonscopy  2012 and 2006   HERNIA REPAIR  2005 - approx   TONSILLECTOMY  1960 - approx    There were no vitals filed for this visit.   Subjective Assessment - 01/14/20 1103    Subjective Pt states small improvments in strength of foot, thinks he is walking better.    Currently in Pain? No/denies                             [facility] Adult PT Treatment/Exercise - 01/14/20 0001      Ambulation/Gait   Gait Comments 35 ft x 8 with cuing for shorter step length.      Exercises   Exercises Lumbar      Lumbar Exercises: Stretches   Active Hamstring Stretch 2 reps;30 seconds    Active Hamstring  Stretch Limitations seated    Single Knee to Chest Stretch --    Other Lumbar Stretch Exercise seated lumbar flexion, 30 sec x 2;      Lumbar Exercises: Aerobic   Recumbent Bike L 2 x 10 min;      Lumbar Exercises: Standing   Heel Raises 10 reps    Functional Squats 20 reps    Other Standing Lumbar Exercises Step ups 6 in x 10 bil;    Other Standing Lumbar Exercises March/Walk 10 ft x 6;  Tandem walk 10 ft x 4; Tandem stance 30 sec x 3 bil;      Lumbar Exercises: Seated   Other Seated Lumbar Exercises Seated HR x 15 bil, no shoes ; Long sitting: PF with GTB 2 x 10;      Lumbar Exercises: Supine   Bridge --    Other Supine Lumbar Exercises --                    PT Short Term Goals - 12/29/19 1314      PT SHORT TERM GOAL #1   Title Pt to be independent with initial HEP    Time  2    Period Weeks    Status New    Target Date 01/12/20             PT Long Term Goals - 12/29/19 1338      PT LONG TERM GOAL #1   Title Pt to demo improved strength of R LE up to 5/5 for improved stability.    Time 6    Period Weeks    Status New    Target Date 02/09/20      PT LONG TERM GOAL #2   Title Pt to demo improved static and dynamic balance to be WNL for improved safety with navigation    Time 6    Period Weeks    Status New    Target Date 02/09/20      PT LONG TERM GOAL #3   Title Pt to report ability for activity and exercise for at least 30 min, without increased pain in LE.    Time 6    Period Weeks    Status New    Target Date 02/09/20      PT LONG TERM GOAL #4   Title Pt to be independent wtih final HEP    Time 6    Period Weeks    Status New    Target Date 02/09/20                 Plan - 01/14/20 1109    Clinical Impression Statement Pt progressing well. Does show signs of improving strength in Plantar flexors, with increased tension for theraband, as well as improved ability for seated HR, Still with diffiuclty with standing HR. continues to  be challenged with stability exercises.    Personal Factors and Comorbidities Comorbidity 1    Comorbidities previous lumbar surgery, LE weakness    Examination-Activity Limitations Stand;Squat;Stairs    Examination-Participation Restrictions Cleaning;Yard Work;Community Activity;Shop    Stability/Clinical Decision Making Evolving/Moderate complexity    Rehab Potential Fair    PT Frequency 2x / week    PT Duration 6 weeks    PT Treatment/Interventions ADLs/Self Care Home Management;Cryotherapy;Electrical Stimulation;Ultrasound;Traction;Moist Heat;Iontophoresis 4mg /ml Dexamethasone;Gait training;Stair training;Functional mobility training;Therapeutic activities;Therapeutic exercise;Balance training;Neuromuscular re-education;Patient/family education;Manual techniques;Passive range of motion;Dry needling;Taping;Vasopneumatic Device;Joint Manipulations;Spinal Manipulations;Orthotic Fit/Training    Consulted and Agree with Plan of Care Patient           Patient will benefit from skilled therapeutic intervention in order to improve the following deficits and impairments:  Pain,Decreased mobility,Decreased strength,Decreased activity tolerance,Decreased balance,Difficulty walking,Decreased range of motion  Visit Diagnosis: Muscle weakness (generalized)  Radiculopathy, lumbar region  Other abnormalities of gait and mobility     Problem List Patient Active Problem List   Diagnosis Date Noted   Previous back surgery 11/24/2019   Hypertension 08/20/2016   Gallstones 02/22/2011   Abdominal pain, right upper quadrant 02/22/2011   Crohn's 02/13/2011   OTITIS MEDIA, SUPPURATIVE 11/09/2009   Dyslipidemia 07/22/2009   ANEMIA, IRON DEFICIENCY 07/22/2009   ASTHMA 07/22/2009   GERD 07/22/2009   PREDIABETES 07/22/2009   NONSPECIFIC ABNORM RESULTS KIDNEY FUNCTION STUDY 07/22/2009   07/24/2009, PT, DPT 11:11 AM  01/14/20    [facility]  [address] [place], [place], [place] Phone: [phone]   Fax:  [phone]  Name: Eric Duran MRN: Myrtis Hopping Date of Birth: 10-26-1955

## 2020-01-18 ENCOUNTER — Encounter: Payer: Self-pay | Admitting: Physical Therapy

## 2020-01-18 ENCOUNTER — Other Ambulatory Visit: Payer: Self-pay

## 2020-01-18 ENCOUNTER — Ambulatory Visit (INDEPENDENT_AMBULATORY_CARE_PROVIDER_SITE_OTHER): Payer: Medicare Other | Admitting: Physical Therapy

## 2020-01-18 DIAGNOSIS — M5416 Radiculopathy, lumbar region: Secondary | ICD-10-CM

## 2020-01-18 DIAGNOSIS — M6281 Muscle weakness (generalized): Secondary | ICD-10-CM

## 2020-01-18 NOTE — Therapy (Signed)
[facility] [address] [place], [place], [address] Phone: [phone]   Fax:  [phone]  Physical Therapy Treatment  Patient Details  Name: Eric Duran MRN: [id number] Date of Birth: [birth year] Referring Provider (PT): Clementeen Graham   Encounter Date: 01/18/2020   PT End of Session - 01/18/20 1023    Visit Number 7    Number of Visits 12    Date for PT Re-Evaluation 02/09/20    Authorization Type Medicare    PT Start Time 0937    PT Stop Time 1016    PT Time Calculation (min) 39 min    Activity Tolerance Patient tolerated treatment well    Behavior During Therapy [facility] for tasks assessed/performed           Past Medical History:  Diagnosis Date  . ANEMIA, IRON DEFICIENCY 07/22/2009  . ASTHMA 07/22/2009   mild  . GERD 07/22/2009  . HYPERLIPIDEMIA 07/22/2009  . Kidney stones 2001   6 times in past  . OTITIS MEDIA, SUPPURATIVE 11/09/2009  . PREDIABETES 07/22/2009   pt does not monitor cbg at home    Past Surgical History:  Procedure Laterality Date  . BIOPSY BOWEL  1982   exploration bowel surgery, chrons disease  . CHOLECYSTECTOMY  02/26/2011   Procedure: LAPAROSCOPIC CHOLECYSTECTOMY WITH INTRAOPERATIVE CHOLANGIOGRAM;  Surgeon: Velora Heckler, MD;  Location: [facility];  Service: General;  Laterality: N/A;  . colonscopy  2012 and 2006  . HERNIA REPAIR  2005 - approx  . TONSILLECTOMY  1960 - approx    There were no vitals filed for this visit.   Subjective Assessment - 01/18/20 1023    Subjective Pt feels he is improving. He has been walking on treadmill and using bike at home. Walking up to 45 min. No pain.    Currently in Pain? No/denies                             [facility] Adult PT Treatment/Exercise - 01/18/20 0001      Ambulation/Gait   Gait Comments --      Exercises   Exercises Lumbar      Lumbar Exercises: Stretches   Active Hamstring Stretch 2 reps;30 seconds    Active Hamstring Stretch  Limitations seated    Single Knee to Chest Stretch 3 reps;30 seconds    Other Lumbar Stretch Exercise seated lumbar flexion, 30 sec x 2;      Lumbar Exercises: Aerobic   Recumbent Bike L 2 x 8 min;      Lumbar Exercises: Standing   Heel Raises 10 reps    Functional Squats 20 reps    Other Standing Lumbar Exercises --    Other Standing Lumbar Exercises March/Walk 10 ft x 6;  Tandem walk 10 ft x 4; Tandem stance with head turns x 10 bil;      Lumbar Exercises: Seated   Long Arc Quad on Chair 20 reps    LAQ on Chair Weights (lbs) 4    Other Seated Lumbar Exercises Seated HR x 15 bil, no shoes ; Long sitting: PF with GTB 2 x 10;    Other Seated Lumbar Exercises HSC x 20 GTB;       Lumbar Exercises: Supine   Bent Knee Raise 20 reps    Bent Knee Raise Limitations Bl TB with TA    Bridge with clamshell 20 reps    Straight Leg Raise 15  reps    Straight Leg Raises Limitations bil                    PT Short Term Goals - 12/29/19 1314      PT SHORT TERM GOAL #1   Title Pt to be independent with initial HEP    Time 2    Period Weeks    Status New    Target Date 01/12/20             PT Long Term Goals - 12/29/19 1338      PT LONG TERM GOAL #1   Title Pt to demo improved strength of R LE up to 5/5 for improved stability.    Time 6    Period Weeks    Status New    Target Date 02/09/20      PT LONG TERM GOAL #2   Title Pt to demo improved static and dynamic balance to be WNL for improved safety with navigation    Time 6    Period Weeks    Status New    Target Date 02/09/20      PT LONG TERM GOAL #3   Title Pt to report ability for activity and exercise for at least 30 min, without increased pain in LE.    Time 6    Period Weeks    Status New    Target Date 02/09/20      PT LONG TERM GOAL #4   Title Pt to be independent wtih final HEP    Time 6    Period Weeks    Status New    Target Date 02/09/20                 Plan - 01/18/20 1024     Clinical Impression Statement Pt able to increase resistance for PF with theraband exercise today. Still unable to do double leg stance heel raise in standing. Challenged with stability exercises,but improving. Pt to benefit from continued care.    Personal Factors and Comorbidities Comorbidity 1    Comorbidities previous lumbar surgery, LE weakness    Examination-Activity Limitations Stand;Squat;Stairs    Examination-Participation Restrictions Cleaning;Yard Work;Community Activity;Shop    Stability/Clinical Decision Making Evolving/Moderate complexity    Rehab Potential Fair    PT Frequency 2x / week    PT Duration 6 weeks    PT Treatment/Interventions ADLs/Self Care Home Management;Cryotherapy;Electrical Stimulation;Ultrasound;Traction;Moist Heat;Iontophoresis 4mg /ml Dexamethasone;Gait training;Stair training;Functional mobility training;Therapeutic activities;Therapeutic exercise;Balance training;Neuromuscular re-education;Patient/family education;Manual techniques;Passive range of motion;Dry needling;Taping;Vasopneumatic Device;Joint Manipulations;Spinal Manipulations;Orthotic Fit/Training    Consulted and Agree with Plan of Care Patient           Patient will benefit from skilled therapeutic intervention in order to improve the following deficits and impairments:  Pain,Decreased mobility,Decreased strength,Decreased activity tolerance,Decreased balance,Difficulty walking,Decreased range of motion  Visit Diagnosis: Muscle weakness (generalized)  Radiculopathy, lumbar region     Problem List Patient Active Problem List   Diagnosis Date Noted  . Previous back surgery 11/24/2019  . Hypertension 08/20/2016  . Gallstones 02/22/2011  . Abdominal pain, right upper quadrant 02/22/2011  . Crohn's 02/13/2011  . OTITIS MEDIA, SUPPURATIVE 11/09/2009  . Dyslipidemia 07/22/2009  . ANEMIA, IRON DEFICIENCY 07/22/2009  . ASTHMA 07/22/2009  . GERD 07/22/2009  . PREDIABETES 07/22/2009  .  NONSPECIFIC ABNORM RESULTS KIDNEY FUNCTION STUDY 07/22/2009    07/24/2009, PT, DPT 10:25 AM  01/18/20    [facility] [address] [place],  [place], [address] Phone: [phone]   Fax:  [phone]  Name: ZYMIERE TROSTLE MRN: [id number] Date of Birth: 12-08-1955

## 2020-01-21 ENCOUNTER — Encounter: Payer: Self-pay | Admitting: Physical Therapy

## 2020-01-21 ENCOUNTER — Other Ambulatory Visit: Payer: Self-pay

## 2020-01-21 ENCOUNTER — Ambulatory Visit (INDEPENDENT_AMBULATORY_CARE_PROVIDER_SITE_OTHER): Payer: Medicare Other | Admitting: Physical Therapy

## 2020-01-21 DIAGNOSIS — M5416 Radiculopathy, lumbar region: Secondary | ICD-10-CM

## 2020-01-21 DIAGNOSIS — M6281 Muscle weakness (generalized): Secondary | ICD-10-CM | POA: Diagnosis not present

## 2020-01-21 DIAGNOSIS — R2689 Other abnormalities of gait and mobility: Secondary | ICD-10-CM

## 2020-01-21 NOTE — Therapy (Signed)
[facility] [address] [place], [place], [address] Phone: [phone]   Fax:  [phone]  Physical Therapy Treatment  Patient Details  Name: Eric Duran MRN: [id number] Date of Birth: 30-Jul-1955 Referring Provider (PT): Clementeen Graham   Encounter Date: 01/21/2020   PT End of Session - 01/21/20 1026    Visit Number 8    Number of Visits 12    Date for PT Re-Evaluation 02/09/20    Authorization Type Medicare    PT Start Time 0930    PT Stop Time 1009    PT Time Calculation (min) 39 min    Activity Tolerance Patient tolerated treatment well    Behavior During Therapy [facility] for tasks assessed/performed           Past Medical History:  Diagnosis Date  . ANEMIA, IRON DEFICIENCY 07/22/2009  . ASTHMA 07/22/2009   mild  . GERD 07/22/2009  . HYPERLIPIDEMIA 07/22/2009  . Kidney stones 2001   6 times in past  . OTITIS MEDIA, SUPPURATIVE 11/09/2009  . PREDIABETES 07/22/2009   pt does not monitor cbg at home    Past Surgical History:  Procedure Laterality Date  . BIOPSY BOWEL  1982   exploration bowel surgery, chrons disease  . CHOLECYSTECTOMY  02/26/2011   Procedure: LAPAROSCOPIC CHOLECYSTECTOMY WITH INTRAOPERATIVE CHOLANGIOGRAM;  Surgeon: Velora Heckler, MD;  Location: [facility];  Service: General;  Laterality: N/A;  . colonscopy  2012 and 2006  . HERNIA REPAIR  2005 - approx  . TONSILLECTOMY  1960 - approx    There were no vitals filed for this visit.   Subjective Assessment - 01/21/20 1025    Subjective Pt doing very well.    Currently in Pain? No/denies                             [facility] Adult PT Treatment/Exercise - 01/21/20 0001      Exercises   Exercises Lumbar      Lumbar Exercises: Stretches   Active Hamstring Stretch 2 reps;30 seconds    Active Hamstring Stretch Limitations seated    Single Knee to Chest Stretch 3 reps;30 seconds    Other Lumbar Stretch Exercise --      Lumbar Exercises: Aerobic    Recumbent Bike L 2/3 x 8 min;      Lumbar Exercises: Standing   Heel Raises 10 reps    Functional Squats 20 reps    Functional Squats Limitations 10 lb front    Other Standing Lumbar Exercises Step ups 6 in x 10 bil; Step ups on to AirEx x 10 bil;  BOSU: A/P weight shifts x 20;    Other Standing Lumbar Exercises March/Walk 10 ft x 6;  Tandem stance with head turns x 10 bil;      Lumbar Exercises: Seated   Long Arc Quad on Chair 20 reps    LAQ on Chair Weights (lbs) 4    Other Seated Lumbar Exercises ; Long sitting: PF with GTB 2 x 10;    Other Seated Lumbar Exercises --      Lumbar Exercises: Supine   Bent Knee Raise 20 reps    Bent Knee Raise Limitations Bl TB with TA    Bridge with clamshell --    Straight Leg Raise --    Straight Leg Raises Limitations --    Other Supine Lumbar Exercises Clam GTB with TA x 20;  Other Supine Lumbar Exercises modified crunch x20;                    PT Short Term Goals - 12/29/19 1314      PT SHORT TERM GOAL #1   Title Pt to be independent with initial HEP    Time 2    Period Weeks    Status New    Target Date 01/12/20             PT Long Term Goals - 12/29/19 1338      PT LONG TERM GOAL #1   Title Pt to demo improved strength of R LE up to 5/5 for improved stability.    Time 6    Period Weeks    Status New    Target Date 02/09/20      PT LONG TERM GOAL #2   Title Pt to demo improved static and dynamic balance to be WNL for improved safety with navigation    Time 6    Period Weeks    Status New    Target Date 02/09/20      PT LONG TERM GOAL #3   Title Pt to report ability for activity and exercise for at least 30 min, without increased pain in LE.    Time 6    Period Weeks    Status New    Target Date 02/09/20      PT LONG TERM GOAL #4   Title Pt to be independent wtih final HEP    Time 6    Period Weeks    Status New    Target Date 02/09/20                 Plan - 01/21/20 1026     Clinical Impression Statement Pt with improving stability and strength overall. Weakness for PF continues, but pt ipmroving with safety and function. Tried heel lift in R shoe for imroving weight bearing through ball of foot, but did not show any changes, pt still with increased weight bearing in heel with gait, will not use at this time. Plan to progress as able.    Personal Factors and Comorbidities Comorbidity 1    Comorbidities previous lumbar surgery, LE weakness    Examination-Activity Limitations Stand;Squat;Stairs    Examination-Participation Restrictions Cleaning;Yard Work;Community Activity;Shop    Stability/Clinical Decision Making Evolving/Moderate complexity    Rehab Potential Fair    PT Frequency 2x / week    PT Duration 6 weeks    PT Treatment/Interventions ADLs/Self Care Home Management;Cryotherapy;Electrical Stimulation;Ultrasound;Traction;Moist Heat;Iontophoresis 4mg /ml Dexamethasone;Gait training;Stair training;Functional mobility training;Therapeutic activities;Therapeutic exercise;Balance training;Neuromuscular re-education;Patient/family education;Manual techniques;Passive range of motion;Dry needling;Taping;Vasopneumatic Device;Joint Manipulations;Spinal Manipulations;Orthotic Fit/Training    Consulted and Agree with Plan of Care Patient           Patient will benefit from skilled therapeutic intervention in order to improve the following deficits and impairments:  Pain,Decreased mobility,Decreased strength,Decreased activity tolerance,Decreased balance,Difficulty walking,Decreased range of motion  Visit Diagnosis: Muscle weakness (generalized)  Radiculopathy, lumbar region  Other abnormalities of gait and mobility     Problem List Patient Active Problem List   Diagnosis Date Noted  . Previous back surgery 11/24/2019  . Hypertension 08/20/2016  . Gallstones 02/22/2011  . Abdominal pain, right upper quadrant 02/22/2011  . Crohn's 02/13/2011  . OTITIS MEDIA,  SUPPURATIVE 11/09/2009  . Dyslipidemia 07/22/2009  . ANEMIA, IRON DEFICIENCY 07/22/2009  . ASTHMA 07/22/2009  . GERD 07/22/2009  . PREDIABETES 07/22/2009  . NONSPECIFIC  ABNORM RESULTS KIDNEY FUNCTION STUDY 07/22/2009    Sedalia Muta, PT, DPT 10:55 AM  01/21/20    [facility] [address] [place], [place], [address] Phone: [phone]   Fax:  [phone]  Name: Eric Duran MRN: [id number] Date of Birth: 1955-12-23

## 2020-01-26 ENCOUNTER — Encounter: Payer: Self-pay | Admitting: Physical Therapy

## 2020-01-26 ENCOUNTER — Ambulatory Visit (INDEPENDENT_AMBULATORY_CARE_PROVIDER_SITE_OTHER): Payer: Medicare Other | Admitting: Physical Therapy

## 2020-01-26 ENCOUNTER — Other Ambulatory Visit: Payer: Self-pay

## 2020-01-26 DIAGNOSIS — M6281 Muscle weakness (generalized): Secondary | ICD-10-CM | POA: Diagnosis not present

## 2020-01-26 DIAGNOSIS — M5416 Radiculopathy, lumbar region: Secondary | ICD-10-CM | POA: Diagnosis not present

## 2020-01-26 DIAGNOSIS — R2689 Other abnormalities of gait and mobility: Secondary | ICD-10-CM

## 2020-01-26 NOTE — Patient Instructions (Signed)
[id number]

## 2020-01-26 NOTE — Therapy (Signed)
[facility] [address] [place], [place], [address] Phone: [phone]   Fax:  [phone]  Physical Therapy Treatment  Patient Details  Name: Eric Duran MRN: [id number] Date of Birth: 1955-05-07 Referring Provider (PT): Clementeen Graham   Encounter Date: 01/26/2020   PT End of Session - 01/26/20 1033    Visit Number 9    Number of Visits 12    Date for PT Re-Evaluation 02/09/20    Authorization Type Medicare    PT Start Time 0932    PT Stop Time 1014    PT Time Calculation (min) 42 min    Activity Tolerance Patient tolerated treatment well    Behavior During Therapy [facility] for tasks assessed/performed           Past Medical History:  Diagnosis Date  . ANEMIA, IRON DEFICIENCY 07/22/2009  . ASTHMA 07/22/2009   mild  . GERD 07/22/2009  . HYPERLIPIDEMIA 07/22/2009  . Kidney stones 2001   6 times in past  . OTITIS MEDIA, SUPPURATIVE 11/09/2009  . PREDIABETES 07/22/2009   pt does not monitor cbg at home    Past Surgical History:  Procedure Laterality Date  . BIOPSY BOWEL  1982   exploration bowel surgery, chrons disease  . CHOLECYSTECTOMY  02/26/2011   Procedure: LAPAROSCOPIC CHOLECYSTECTOMY WITH INTRAOPERATIVE CHOLANGIOGRAM;  Surgeon: Velora Heckler, MD;  Location: [facility];  Service: General;  Laterality: N/A;  . colonscopy  2012 and 2006  . HERNIA REPAIR  2005 - approx  . TONSILLECTOMY  1960 - approx    There were no vitals filed for this visit.   Subjective Assessment - 01/26/20 1032    Subjective No new complaints    Currently in Pain? No/denies                             [facility] Adult PT Treatment/Exercise - 01/26/20 0001      Exercises   Exercises Lumbar      Lumbar Exercises: Stretches   Active Hamstring Stretch --    Active Hamstring Stretch Limitations --    Single Knee to Chest Stretch --      Lumbar Exercises: Aerobic   Recumbent Bike L 3 x 8 min;      Lumbar Exercises: Standing   Heel  Raises --    Functional Squats 20 reps    Functional Squats Limitations 10 lb front    Other Standing Lumbar Exercises Step ups on to BOSU x 20;    Other Standing Lumbar Exercises L/R and A/P weight shifts on AirEx;  Tandem stance on AirEx 30 sec x 3 bil;  Bwd walking 10 ft x 6;      Lumbar Exercises: Seated   Long Arc Quad on Chair --    LAQ on Chair Weights (lbs) --    Other Seated Lumbar Exercises ; Long sitting: PF with GTB, Inv/Ev with RTB,  2 x 10 ea;      Lumbar Exercises: Supine   Bent Knee Raise --    Bent Knee Raise Limitations --    Bridge with clamshell 20 reps    Straight Leg Raise 15 reps    Other Supine Lumbar Exercises --    Other Supine Lumbar Exercises --                  PT Education - 01/26/20 1032    Education Details Reviewed HEP    Person(s)  Educated Patient    Methods Explanation;Demonstration;Verbal cues;Tactile cues;Handout    Comprehension Verbalized understanding;Returned demonstration;Verbal cues required;Tactile cues required            PT Short Term Goals - 12/29/19 1314      PT SHORT TERM GOAL #1   Title Pt to be independent with initial HEP    Time 2    Period Weeks    Status New    Target Date 01/12/20             PT Long Term Goals - 12/29/19 1338      PT LONG TERM GOAL #1   Title Pt to demo improved strength of R LE up to 5/5 for improved stability.    Time 6    Period Weeks    Status New    Target Date 02/09/20      PT LONG TERM GOAL #2   Title Pt to demo improved static and dynamic balance to be WNL for improved safety with navigation    Time 6    Period Weeks    Status New    Target Date 02/09/20      PT LONG TERM GOAL #3   Title Pt to report ability for activity and exercise for at least 30 min, without increased pain in LE.    Time 6    Period Weeks    Status New    Target Date 02/09/20      PT LONG TERM GOAL #4   Title Pt to be independent wtih final HEP    Time 6    Period Weeks    Status New     Target Date 02/09/20                 Plan - 01/26/20 1034    Clinical Impression Statement Pt progressing well with increasing difficulty of NMR, unstable surfaces. Still challenged with exercises and stability due to weakness with PF. Updated and Reviewed HEP today. Likely d/c in next couple weeks.    Personal Factors and Comorbidities Comorbidity 1    Comorbidities previous lumbar surgery, LE weakness    Examination-Activity Limitations Stand;Squat;Stairs    Examination-Participation Restrictions Cleaning;Yard Work;Community Activity;Shop    Stability/Clinical Decision Making Evolving/Moderate complexity    Rehab Potential Fair    PT Frequency 2x / week    PT Duration 6 weeks    PT Treatment/Interventions ADLs/Self Care Home Management;Cryotherapy;Electrical Stimulation;Ultrasound;Traction;Moist Heat;Iontophoresis 4mg /ml Dexamethasone;Gait training;Stair training;Functional mobility training;Therapeutic activities;Therapeutic exercise;Balance training;Neuromuscular re-education;Patient/family education;Manual techniques;Passive range of motion;Dry needling;Taping;Vasopneumatic Device;Joint Manipulations;Spinal Manipulations;Orthotic Fit/Training    Consulted and Agree with Plan of Care Patient           Patient will benefit from skilled therapeutic intervention in order to improve the following deficits and impairments:  Pain,Decreased mobility,Decreased strength,Decreased activity tolerance,Decreased balance,Difficulty walking,Decreased range of motion  Visit Diagnosis: Muscle weakness (generalized)  Radiculopathy, lumbar region  Other abnormalities of gait and mobility     Problem List Patient Active Problem List   Diagnosis Date Noted  . Previous back surgery 11/24/2019  . Hypertension 08/20/2016  . Gallstones 02/22/2011  . Abdominal pain, right upper quadrant 02/22/2011  . Crohn's 02/13/2011  . OTITIS MEDIA, SUPPURATIVE 11/09/2009  . Dyslipidemia 07/22/2009   . ANEMIA, IRON DEFICIENCY 07/22/2009  . ASTHMA 07/22/2009  . GERD 07/22/2009  . PREDIABETES 07/22/2009  . NONSPECIFIC ABNORM RESULTS KIDNEY FUNCTION STUDY 07/22/2009   07/24/2009, PT, DPT 10:38 AM  01/26/20    [facility] [address] [place], [place], [address] Phone: [phone]   Fax:  [phone]  Name: Eric Duran MRN: [id number] Date of Birth: [birth year]

## 2020-01-28 ENCOUNTER — Ambulatory Visit (INDEPENDENT_AMBULATORY_CARE_PROVIDER_SITE_OTHER): Payer: Medicare Other | Admitting: Physical Therapy

## 2020-01-28 ENCOUNTER — Encounter: Payer: Self-pay | Admitting: Physical Therapy

## 2020-01-28 DIAGNOSIS — M6281 Muscle weakness (generalized): Secondary | ICD-10-CM

## 2020-01-28 DIAGNOSIS — M5416 Radiculopathy, lumbar region: Secondary | ICD-10-CM | POA: Diagnosis not present

## 2020-01-28 DIAGNOSIS — R2689 Other abnormalities of gait and mobility: Secondary | ICD-10-CM | POA: Diagnosis not present

## 2020-01-28 NOTE — Therapy (Signed)
[facility] [address] [place], [place], [address] Phone: [phone]   Fax:  [phone]  Physical Therapy Treatment  Patient Details  Name: Eric Duran MRN: [id number] Date of Birth: Apr 10, 1955 Referring Provider (PT): Clementeen Graham   Encounter Date: 01/28/2020   PT End of Session - 01/28/20 0938    Visit Number 10    Number of Visits 12    Date for PT Re-Evaluation 02/09/20    Authorization Type Medicare    PT Start Time 0932    PT Stop Time 1012    PT Time Calculation (min) 40 min    Activity Tolerance Patient tolerated treatment well    Behavior During Therapy [facility] for tasks assessed/performed           Past Medical History:  Diagnosis Date   ANEMIA, IRON DEFICIENCY 07/22/2009   ASTHMA 07/22/2009   mild   GERD 07/22/2009   HYPERLIPIDEMIA 07/22/2009   Kidney stones 2001   6 times in past   OTITIS MEDIA, SUPPURATIVE 11/09/2009   PREDIABETES 07/22/2009   pt does not monitor cbg at home    Past Surgical History:  Procedure Laterality Date   BIOPSY BOWEL  1982   exploration bowel surgery, chrons disease   CHOLECYSTECTOMY  02/26/2011   Procedure: LAPAROSCOPIC CHOLECYSTECTOMY WITH INTRAOPERATIVE CHOLANGIOGRAM;  Surgeon: Velora Heckler, MD;  Location: [facility];  Service: General;  Laterality: N/A;   colonscopy  2012 and 2006   HERNIA REPAIR  2005 - approx   TONSILLECTOMY  1960 - approx    There were no vitals filed for this visit.   Subjective Assessment - 01/28/20 0937    Subjective No new complaints.    Currently in Pain? No/denies                             [facility] Adult PT Treatment/Exercise - 01/28/20 0001      Exercises   Exercises Lumbar      Lumbar Exercises: Stretches   Active Hamstring Stretch 2 reps;30 seconds    Active Hamstring Stretch Limitations seated      Lumbar Exercises: Aerobic   Recumbent Bike L 3 x 10 min;      Lumbar Exercises: Standing   Functional Squats  20 reps    Functional Squats Limitations 10 lb front    Other Standing Lumbar Exercises BOSU: weight shifts a/p  x20;  mini squats x 15;    Other Standing Lumbar Exercises Bwd walking 10 ft x 6; Tandem walk 10 ft x 4;  Step ups onto 8 in step x 10 bil;   Step up 6 in with opp LE march x 10 bil;      Lumbar Exercises: Seated   Other Seated Lumbar Exercises ; Long sitting: PF with BlTB, Inv/Ev with RTB,  2 x 10 ea;      Lumbar Exercises: Supine   Bent Knee Raise 20 reps    Bent Knee Raise Limitations Bl TB with TA    Straight Leg Raise 15 reps      Lumbar Exercises: Sidelying   Hip Abduction 15 reps;Both                    PT Short Term Goals - 12/29/19 1314      PT SHORT TERM GOAL #1   Title Pt to be independent with initial HEP    Time 2    Period  Weeks    Status New    Target Date 01/12/20             PT Long Term Goals - 12/29/19 1338      PT LONG TERM GOAL #1   Title Pt to demo improved strength of R LE up to 5/5 for improved stability.    Time 6    Period Weeks    Status New    Target Date 02/09/20      PT LONG TERM GOAL #2   Title Pt to demo improved static and dynamic balance to be WNL for improved safety with navigation    Time 6    Period Weeks    Status New    Target Date 02/09/20      PT LONG TERM GOAL #3   Title Pt to report ability for activity and exercise for at least 30 min, without increased pain in LE.    Time 6    Period Weeks    Status New    Target Date 02/09/20      PT LONG TERM GOAL #4   Title Pt to be independent wtih final HEP    Time 6    Period Weeks    Status New    Target Date 02/09/20                 Plan - 01/28/20 1206    Clinical Impression Statement Pt improving with overall stability and safety with activities. Challenged with tandem and bwd walking today. Weakness in foot effecting balance. Plan to see pt 1x/wk for next 2 weeks, then likely d/c.    Personal Factors and Comorbidities Comorbidity 1     Comorbidities previous lumbar surgery, LE weakness    Examination-Activity Limitations Stand;Squat;Stairs    Examination-Participation Restrictions Cleaning;Yard Work;Community Activity;Shop    Stability/Clinical Decision Making Evolving/Moderate complexity    Rehab Potential Fair    PT Frequency 2x / week    PT Duration 6 weeks    PT Treatment/Interventions ADLs/Self Care Home Management;Cryotherapy;Electrical Stimulation;Ultrasound;Traction;Moist Heat;Iontophoresis 4mg /ml Dexamethasone;Gait training;Stair training;Functional mobility training;Therapeutic activities;Therapeutic exercise;Balance training;Neuromuscular re-education;Patient/family education;Manual techniques;Passive range of motion;Dry needling;Taping;Vasopneumatic Device;Joint Manipulations;Spinal Manipulations;Orthotic Fit/Training    Consulted and Agree with Plan of Care Patient           Patient will benefit from skilled therapeutic intervention in order to improve the following deficits and impairments:  Pain,Decreased mobility,Decreased strength,Decreased activity tolerance,Decreased balance,Difficulty walking,Decreased range of motion  Visit Diagnosis: Muscle weakness (generalized)  Radiculopathy, lumbar region  Other abnormalities of gait and mobility     Problem List Patient Active Problem List   Diagnosis Date Noted   Previous back surgery 11/24/2019   Hypertension 08/20/2016   Gallstones 02/22/2011   Abdominal pain, right upper quadrant 02/22/2011   Crohn's 02/13/2011   OTITIS MEDIA, SUPPURATIVE 11/09/2009   Dyslipidemia 07/22/2009   ANEMIA, IRON DEFICIENCY 07/22/2009   ASTHMA 07/22/2009   GERD 07/22/2009   PREDIABETES 07/22/2009   NONSPECIFIC ABNORM RESULTS KIDNEY FUNCTION STUDY 07/22/2009   07/24/2009, PT, DPT 12:07 PM  01/28/20    [facility] [address] [place], [place], [place] Phone: [phone]   Fax:   [phone]  Name: Eric Duran MRN: Myrtis Hopping Date of Birth: May 24, 1955

## 2020-02-04 ENCOUNTER — Encounter: Payer: Self-pay | Admitting: Physical Therapy

## 2020-02-04 ENCOUNTER — Ambulatory Visit (INDEPENDENT_AMBULATORY_CARE_PROVIDER_SITE_OTHER): Payer: Medicare Other | Admitting: Physical Therapy

## 2020-02-04 ENCOUNTER — Other Ambulatory Visit: Payer: Self-pay

## 2020-02-04 DIAGNOSIS — R2689 Other abnormalities of gait and mobility: Secondary | ICD-10-CM

## 2020-02-04 DIAGNOSIS — M6281 Muscle weakness (generalized): Secondary | ICD-10-CM | POA: Diagnosis not present

## 2020-02-04 DIAGNOSIS — M5416 Radiculopathy, lumbar region: Secondary | ICD-10-CM

## 2020-02-04 NOTE — Therapy (Signed)
[facility] [address] [place], [place], [address] Phone: [phone]   Fax:  [phone]  Physical Therapy Treatment  Patient Details  Name: Eric Duran MRN: [id number] Date of Birth: 05-09-1955 Referring Provider (PT): Clementeen Graham   Encounter Date: 02/04/2020   PT End of Session - 02/04/20 0851    Visit Number 11    Number of Visits 12    Date for PT Re-Evaluation 02/09/20    Authorization Type Medicare    PT Start Time 0848    PT Stop Time 0928    PT Time Calculation (min) 40 min    Activity Tolerance Patient tolerated treatment well    Behavior During Therapy [facility] for tasks assessed/performed           Past Medical History:  Diagnosis Date  . ANEMIA, IRON DEFICIENCY 07/22/2009  . ASTHMA 07/22/2009   mild  . GERD 07/22/2009  . HYPERLIPIDEMIA 07/22/2009  . Kidney stones 2001   6 times in past  . OTITIS MEDIA, SUPPURATIVE 11/09/2009  . PREDIABETES 07/22/2009   pt does not monitor cbg at home    Past Surgical History:  Procedure Laterality Date  . BIOPSY BOWEL  1982   exploration bowel surgery, chrons disease  . CHOLECYSTECTOMY  02/26/2011   Procedure: LAPAROSCOPIC CHOLECYSTECTOMY WITH INTRAOPERATIVE CHOLANGIOGRAM;  Surgeon: Velora Heckler, MD;  Location: [facility];  Service: General;  Laterality: N/A;  . colonscopy  2012 and 2006  . HERNIA REPAIR  2005 - approx  . TONSILLECTOMY  1960 - approx    There were no vitals filed for this visit.   Subjective Assessment - 02/04/20 0850    Subjective Pt states doing well. Walking on T-mill 2x/wk and riding bike 2x/wk at home, also doing HEP. Feels R foot is getting stronger    Currently in Pain? No/denies                             [facility] Adult PT Treatment/Exercise - 02/04/20 0852      Exercises   Exercises Lumbar      Lumbar Exercises: Stretches   Active Hamstring Stretch --    Active Hamstring Stretch Limitations --    Gastroc Stretch 2 reps;30  seconds    Other Lumbar Stretch Exercise Soleus stretch 30 sec x 2 bil;  Seated self PFasc stretch and toe extensor stretch      Lumbar Exercises: Aerobic   Recumbent Bike L 3 x 8 min;      Lumbar Exercises: Standing   Functional Squats 20 reps    Functional Squats Limitations 10 lb front    Other Standing Lumbar Exercises --    Other Standing Lumbar Exercises Bwd walking 10 ft x 6; BWd weight shifts for PF strength toe to heel x 25; Tandem stance 30 sec x 3 bil;      Lumbar Exercises: Seated   Other Seated Lumbar Exercises ; Long sitting: PF with BlTB, Inv/Ev with GTB,  2 x 10 ea;    Other Seated Lumbar Exercises Walk/March 10 ft x 6;      Lumbar Exercises: Supine   Bent Knee Raise --    Bent Knee Raise Limitations --    Straight Leg Raise --      Lumbar Exercises: Sidelying   Hip Abduction --                    PT Short  Term Goals - 12/29/19 1314      PT SHORT TERM GOAL #1   Title Pt to be independent with initial HEP    Time 2    Period Weeks    Status New    Target Date 01/12/20             PT Long Term Goals - 12/29/19 1338      PT LONG TERM GOAL #1   Title Pt to demo improved strength of R LE up to 5/5 for improved stability.    Time 6    Period Weeks    Status New    Target Date 02/09/20      PT LONG TERM GOAL #2   Title Pt to demo improved static and dynamic balance to be WNL for improved safety with navigation    Time 6    Period Weeks    Status New    Target Date 02/09/20      PT LONG TERM GOAL #3   Title Pt to report ability for activity and exercise for at least 30 min, without increased pain in LE.    Time 6    Period Weeks    Status New    Target Date 02/09/20      PT LONG TERM GOAL #4   Title Pt to be independent wtih final HEP    Time 6    Period Weeks    Status New    Target Date 02/09/20                 Plan - 02/04/20 0951    Clinical Impression Statement Pt progressing well. Continued improvments in strength  for R plantar flexion, seen with increased resistance with t-band for all motions in ankle. PF still weak with standing heel raise, but improved strength with manual strength testing, and improving control of foot in standing with stability. Noted extension/slight claw of R toes due to weakness of foot and compensation. Discussed strengthening for this today.    Personal Factors and Comorbidities Comorbidity 1    Comorbidities previous lumbar surgery, LE weakness    Examination-Activity Limitations Stand;Squat;Stairs    Examination-Participation Restrictions Cleaning;Yard Work;Community Activity;Shop    Stability/Clinical Decision Making Evolving/Moderate complexity    Rehab Potential Fair    PT Frequency 2x / week    PT Duration 6 weeks    PT Treatment/Interventions ADLs/Self Care Home Management;Cryotherapy;Electrical Stimulation;Ultrasound;Traction;Moist Heat;Iontophoresis 4mg /ml Dexamethasone;Gait training;Stair training;Functional mobility training;Therapeutic activities;Therapeutic exercise;Balance training;Neuromuscular re-education;Patient/family education;Manual techniques;Passive range of motion;Dry needling;Taping;Vasopneumatic Device;Joint Manipulations;Spinal Manipulations;Orthotic Fit/Training    Consulted and Agree with Plan of Care Patient           Patient will benefit from skilled therapeutic intervention in order to improve the following deficits and impairments:  Pain,Decreased mobility,Decreased strength,Decreased activity tolerance,Decreased balance,Difficulty walking,Decreased range of motion  Visit Diagnosis: Muscle weakness (generalized)  Radiculopathy, lumbar region  Other abnormalities of gait and mobility     Problem List Patient Active Problem List   Diagnosis Date Noted  . Previous back surgery 11/24/2019  . Hypertension 08/20/2016  . Gallstones 02/22/2011  . Abdominal pain, right upper quadrant 02/22/2011  . Crohn's 02/13/2011  . OTITIS MEDIA,  SUPPURATIVE 11/09/2009  . Dyslipidemia 07/22/2009  . ANEMIA, IRON DEFICIENCY 07/22/2009  . ASTHMA 07/22/2009  . GERD 07/22/2009  . PREDIABETES 07/22/2009  . NONSPECIFIC ABNORM RESULTS KIDNEY FUNCTION STUDY 07/22/2009   Lyndee Hensen, PT, DPT 9:53 AM  02/04/20    [facility]  [address] [place], [place], [address] Phone: [phone]   Fax:  [phone]  Name: Eric Duran MRN: [id number] Date of Birth: 1955-02-15

## 2020-02-09 ENCOUNTER — Encounter: Payer: Self-pay | Admitting: Physical Therapy

## 2020-02-09 ENCOUNTER — Ambulatory Visit (INDEPENDENT_AMBULATORY_CARE_PROVIDER_SITE_OTHER): Payer: Medicare Other | Admitting: Physical Therapy

## 2020-02-09 ENCOUNTER — Other Ambulatory Visit: Payer: Self-pay

## 2020-02-09 DIAGNOSIS — M5416 Radiculopathy, lumbar region: Secondary | ICD-10-CM

## 2020-02-09 DIAGNOSIS — M6281 Muscle weakness (generalized): Secondary | ICD-10-CM | POA: Diagnosis not present

## 2020-02-09 DIAGNOSIS — R2689 Other abnormalities of gait and mobility: Secondary | ICD-10-CM | POA: Diagnosis not present

## 2020-02-09 NOTE — Patient Instructions (Signed)
Access Code: [email or web address] URL: [email or web address] Date: 02/09/2020 Prepared by: Sedalia Muta  Exercises Supine Single Knee to Chest Stretch - 2 x daily - 3 reps - 30 hold Seated Hamstring Stretch - 2 x daily - 3 reps - 30 hold Supine Posterior Pelvic Tilt - 2 x daily - 1 sets - 10 reps Supine March - 1 x daily - 2 sets - 10 reps Supine Bridge - 1 x daily - 2 sets - 10 reps Straight Leg Raise - 1 x daily - 2 sets - 10 reps Seated Knee Extension AROM - 2 x daily - 2 sets - 10 reps Mini Squat - 1 x daily - 2 sets - 10 reps Stride Stance Weight Shift - 1 x daily - 2 sets - 10 reps Forward Backward Weight Shift with Counter Support - 1 x daily - 2 sets - 10 reps Tandem Stance - 1 x daily - 2 sets - 10 reps Seated Heel Raise - 2 x daily - 2 sets - 10 reps Long Sitting Ankle Plantar Flexion with Resistance - 1 x daily - 2 sets - 10 reps Ankle Inversion with Resistance - 1 x daily - 2 sets - 10 reps Ankle Eversion with Resistance - 1 x daily - 2 sets - 10 reps

## 2020-02-09 NOTE — Therapy (Signed)
[facility] [address] [place], [place], [address] Phone: [phone]   Fax:  [phone]  Physical Therapy Treatment/Discharge   Patient Details  Name: Eric Duran MRN: [id number] Date of Birth: [birth year] Referring Provider (PT): Lynne Leader   Encounter Date: 02/09/2020   PT End of Session - 02/09/20 0946    Visit Number 12    Number of Visits 12    Date for PT Re-Evaluation 02/09/20    Authorization Type Medicare    PT Start Time 0933    PT Stop Time 1015    PT Time Calculation (min) 42 min    Activity Tolerance Patient tolerated treatment well    Behavior During Therapy [facility] for tasks assessed/performed           Past Medical History:  Diagnosis Date  . ANEMIA, IRON DEFICIENCY 07/22/2009  . ASTHMA 07/22/2009   mild  . GERD 07/22/2009  . HYPERLIPIDEMIA 07/22/2009  . Kidney stones 2001   6 times in past  . OTITIS MEDIA, SUPPURATIVE 11/09/2009  . PREDIABETES 07/22/2009   pt does not monitor cbg at home    Past Surgical History:  Procedure Laterality Date  . BIOPSY BOWEL  1982   exploration bowel surgery, chrons disease  . CHOLECYSTECTOMY  02/26/2011   Procedure: LAPAROSCOPIC CHOLECYSTECTOMY WITH INTRAOPERATIVE CHOLANGIOGRAM;  Surgeon: Earnstine Regal, MD;  Location: [facility];  Service: General;  Laterality: N/A;  . colonscopy  2012 and 2006  . HERNIA REPAIR  2005 - approx  . TONSILLECTOMY  1960 - approx    There were no vitals filed for this visit.   Subjective Assessment - 02/09/20 0944    Subjective Pt with no new complaints.    Currently in Pain? No/denies    Pain Score 0-No pain                             [facility] Adult PT Treatment/Exercise - 02/09/20 0001      Exercises   Exercises Lumbar      Lumbar Exercises: Stretches   Active Hamstring Stretch 2 reps;30 seconds    Single Knee to Chest Stretch 2 reps;30 seconds    Pelvic Tilt 20 reps      Lumbar Exercises: Aerobic   Recumbent Bike  L 3 x 8 min;      Lumbar Exercises: Standing   Functional Squats 20 reps    Other Standing Lumbar Exercises L/R and A/P weight shifts on AireEx x 25; Tandem stance 30 sec x 3 bil; Walk/March 10 ft x 4;      Lumbar Exercises: Seated   Other Seated Lumbar Exercises ; Long sitting: PF with BlTB, Inv/Ev with GTB,  2 x 10 ea; Seated HR x 20;    Other Seated Lumbar Exercises --      Lumbar Exercises: Supine   Bridge 15 reps    Straight Leg Raise 15 reps    Other Supine Lumbar Exercises modified crunch x 10;                  PT Education - 02/09/20 0945    Education Details Final HEP reviewed in detail.    Person(s) Educated Patient    Methods Explanation;Demonstration;Handout;Verbal cues    Comprehension Verbalized understanding;Verbal cues required;Returned demonstration            PT Short Term Goals - 02/09/20 1211      PT SHORT TERM GOAL #  1   Title Pt to be independent with initial HEP    Time 2    Period Weeks    Status Achieved    Target Date 01/12/20             PT Long Term Goals - 02/09/20 1217      PT LONG TERM GOAL #1   Title Pt to demo improved strength of R LE up to 5/5 for improved stability.    Time 6    Period Weeks    Status Partially Met      PT LONG TERM GOAL #2   Title Pt to demo improved static and dynamic balance to be WNL for improved safety with navigation    Time 6    Period Weeks    Status Achieved      PT LONG TERM GOAL #3   Title Pt to report ability for activity and exercise for at least 30 min, without increased pain in LE.    Time 6    Period Weeks    Status Achieved      PT LONG TERM GOAL #4   Title Pt to be independent wtih final HEP    Time 6    Period Weeks    Status Achieved                 Plan - 02/09/20 1212    Clinical Impression Statement Pt has made very good progress. He has no pain in back/LE, and has ROM WNL folowing back surgery. He has made good improvments in weakness in R foot/plantar  flexors, but still has weakness here. Pt with much improved stability for functional activities. Pt has been able to return to regular exercise, and is doing very well with HEP for back, Leg strength, foot strength, and balance. Final HEP reviewed today. Did recommend that pt f/u with MD for final outcome or recommendations for remaining foot weakness. Pt with no further deficits requiring skilled care at this time iand is ready for d/c .    Personal Factors and Comorbidities Comorbidity 1    Comorbidities previous lumbar surgery, LE weakness    Examination-Activity Limitations Stand;Squat;Stairs    Examination-Participation Restrictions Cleaning;Yard Work;Community Activity;Shop    Stability/Clinical Decision Making Evolving/Moderate complexity    Rehab Potential Fair    PT Frequency 2x / week    PT Duration 6 weeks    PT Treatment/Interventions ADLs/Self Care Home Management;Cryotherapy;Electrical Stimulation;Ultrasound;Traction;Moist Heat;Iontophoresis 45m/ml Dexamethasone;Gait training;Stair training;Functional mobility training;Therapeutic activities;Therapeutic exercise;Balance training;Neuromuscular re-education;Patient/family education;Manual techniques;Passive range of motion;Dry needling;Taping;Vasopneumatic Device;Joint Manipulations;Spinal Manipulations;Orthotic Fit/Training    Consulted and Agree with Plan of Care Patient           Patient will benefit from skilled therapeutic intervention in order to improve the following deficits and impairments:  Pain,Decreased mobility,Decreased strength,Decreased activity tolerance,Decreased balance,Difficulty walking,Decreased range of motion  Visit Diagnosis: Muscle weakness (generalized)  Radiculopathy, lumbar region  Other abnormalities of gait and mobility     Problem List Patient Active Problem List   Diagnosis Date Noted  . Previous back surgery 11/24/2019  . Hypertension 08/20/2016  . Gallstones 02/22/2011  . Abdominal  pain, right upper quadrant 02/22/2011  . Crohn's 02/13/2011  . OTITIS MEDIA, SUPPURATIVE 11/09/2009  . Dyslipidemia 07/22/2009  . ANEMIA, IRON DEFICIENCY 07/22/2009  . ASTHMA 07/22/2009  . GERD 07/22/2009  . PREDIABETES 07/22/2009  . NONSPECIFIC ABNORM RESULTS KIDNEY FUNCTION STUDY 07/22/2009   LLyndee Hensen PT, DPT 12:18 PM  02/09/20    [facility] [address] [place], [place], [address] Phone: [phone]   Fax:  [phone]  Name: Eric Duran MRN: [id number] Date of Birth: 01/09/1956   PHYSICAL THERAPY DISCHARGE SUMMARY  Visits from Start of Care:12 Plan: Patient agrees to discharge.  Patient goals were met. Patient is being discharged due to meeting the stated rehab goals.  ?????     Lyndee Hensen, PT, DPT 12:18 PM  02/09/20

## 2020-02-12 ENCOUNTER — Telehealth: Payer: Self-pay | Admitting: Family Medicine

## 2020-02-12 NOTE — Telephone Encounter (Signed)
Left message for patient to call back and schedule Medicare Annual Wellness Visit (AWV) either virtually or in office.   Last AWV no information please schedule at anytime with [facility] Nurse Health Advisor 1 or 2   This should be a 45 minute visit. 

## 2020-04-19 ENCOUNTER — Telehealth: Payer: Self-pay | Admitting: Family Medicine

## 2020-04-19 NOTE — Telephone Encounter (Signed)
Left message for patient to call back and schedule Medicare Annual Wellness Visit (AWV) either virtually or in office. No detailed message   AWVI  please schedule at anytime with [facility] Nurse Health Advisor 1 or 2   This should be a 45 minute visit. 

## 2020-04-27 ENCOUNTER — Encounter: Payer: Medicare Other | Admitting: Family Medicine

## 2020-05-01 ENCOUNTER — Encounter: Payer: Self-pay | Admitting: Family Medicine

## 2020-05-02 MED ORDER — LOSARTAN POTASSIUM 50 MG PO TABS
50.0000 mg | ORAL_TABLET | Freq: Every day | ORAL | 3 refills | Status: DC
Start: 2020-05-02 — End: 2020-05-26

## 2020-05-02 MED ORDER — PRAVASTATIN SODIUM 40 MG PO TABS
40.0000 mg | ORAL_TABLET | Freq: Every day | ORAL | 3 refills | Status: DC
Start: 2020-05-02 — End: 2021-06-05

## 2020-05-06 ENCOUNTER — Other Ambulatory Visit: Payer: Self-pay | Admitting: Family Medicine

## 2020-05-10 ENCOUNTER — Ambulatory Visit (INDEPENDENT_AMBULATORY_CARE_PROVIDER_SITE_OTHER): Payer: Medicare Other | Admitting: Family Medicine

## 2020-05-10 ENCOUNTER — Encounter: Payer: Self-pay | Admitting: Family Medicine

## 2020-05-10 ENCOUNTER — Other Ambulatory Visit: Payer: Self-pay

## 2020-05-10 VITALS — BP 142/80 | HR 58 | Temp 97.6°F | Ht 74.0 in | Wt 232.2 lb

## 2020-05-10 DIAGNOSIS — Z125 Encounter for screening for malignant neoplasm of prostate: Secondary | ICD-10-CM | POA: Diagnosis not present

## 2020-05-10 DIAGNOSIS — E785 Hyperlipidemia, unspecified: Secondary | ICD-10-CM | POA: Diagnosis not present

## 2020-05-10 DIAGNOSIS — I10 Essential (primary) hypertension: Secondary | ICD-10-CM | POA: Diagnosis not present

## 2020-05-10 DIAGNOSIS — R519 Headache, unspecified: Secondary | ICD-10-CM

## 2020-05-10 DIAGNOSIS — Z23 Encounter for immunization: Secondary | ICD-10-CM

## 2020-05-10 LAB — LIPID PANEL
Cholesterol: 161 mg/dL (ref 0–200)
HDL: 65.2 mg/dL (ref >39.00)
LDL Cholesterol: 85 mg/dL (ref 0–99)
NonHDL: 95.36
Total CHOL/HDL Ratio: 2
Triglycerides: 53 mg/dL (ref 0.0–149.0)
VLDL: 10.6 mg/dL (ref 0.0–40.0)

## 2020-05-10 LAB — HEPATIC FUNCTION PANEL
ALT: 28 U/L (ref 0–53)
AST: 20 U/L (ref 0–37)
Albumin: 4.3 g/dL (ref 3.5–5.2)
Alkaline Phosphatase: 53 U/L (ref 39–117)
Bilirubin, Direct: 0.2 mg/dL (ref 0.0–0.3)
Total Bilirubin: 0.9 mg/dL (ref 0.2–1.2)
Total Protein: 6.9 g/dL (ref 6.0–8.3)

## 2020-05-10 LAB — PSA, MEDICARE: PSA: 0.58 ng/ml (ref 0.10–4.00)

## 2020-05-10 LAB — BASIC METABOLIC PANEL
BUN: 20 mg/dL (ref 6–23)
CO2: 29 mEq/L (ref 19–32)
Calcium: 9.6 mg/dL (ref 8.4–10.5)
Chloride: 101 mEq/L (ref 96–112)
Creatinine, Ser: 1.34 mg/dL (ref 0.40–1.50)
GFR: 55.13 mL/min — ABNORMAL LOW (ref >60.00)
Glucose, Bld: 95 mg/dL (ref 70–99)
Potassium: 5.1 mEq/L (ref 3.5–5.1)
Sodium: 139 mEq/L (ref 135–145)

## 2020-05-10 NOTE — Progress Notes (Signed)
Established Patient Office Visit  Subjective:  Patient ID: Eric Duran, male    DOB: 12-21-1955  Age: 65 y.o. MRN: [id number]  CC:  Chief Complaint  Patient presents with  . Annual Exam    BP avg 140/70 at home, constant headaches x 1 year, 5 out of 7 days, not extremely painful    HPI Eric Duran presents for medical follow-up.  He has some ongoing low back pain.  Is had previous back surgery.  Is able to ambulate with walking on a treadmill including inclines and tolerating that fairly well.  Overall feels well with exception of 1 year history of frequent almost daily headaches.  These are frequently bilateral and posterior and radiates somewhat anterior.  He does have some intermittent neck pain and neck stiffness and is suspicious he has significant arthritis in the neck.  He has had gradually decreased range of motion over time.  He has hypertension which is treated with losartan 50 mg daily.  Compliant with therapy.  Blood pressures at home fairly consistently around 140/70.  No excessive sodium consumption.  He has hyperlipidemia treated with pravastatin.  He is due for follow-up labs.  Health maintenance reviewed.  He needs Prevnar 13.  Other immunizations are up-to-date.  Has had COVID vaccines but he did not have dates with him today.  Previous hepatitis C screen negative.  Last colonoscopy 12/01/2019.  Past Medical History:  Diagnosis Date  . ANEMIA, IRON DEFICIENCY 07/22/2009  . ASTHMA 07/22/2009   mild  . GERD 07/22/2009  . HYPERLIPIDEMIA 07/22/2009  . Kidney stones 2001   6 times in past  . OTITIS MEDIA, SUPPURATIVE 11/09/2009  . PREDIABETES 07/22/2009   pt does not monitor cbg at home    Past Surgical History:  Procedure Laterality Date  . BIOPSY BOWEL  1982   exploration bowel surgery, chrons disease  . CHOLECYSTECTOMY  02/26/2011   Procedure: LAPAROSCOPIC CHOLECYSTECTOMY WITH INTRAOPERATIVE CHOLANGIOGRAM;  Surgeon: Velora Heckler, MD;  Location: [facility];   Service: General;  Laterality: N/A;  . colonscopy  2012 and 2006  . HERNIA REPAIR  2005 - approx  . TONSILLECTOMY  1960 - approx    Family History  Problem Relation Age of Onset  . Cancer Father        colon  . Hypertension Maternal Grandfather   . Stroke Maternal Grandfather     Social History   Socioeconomic History  . Marital status: Married    Spouse name: Not on file  . Number of children: Not on file  . Years of education: Not on file  . Highest education level: Not on file  Occupational History  . Not on file  Tobacco Use  . Smoking status: Never Smoker  . Smokeless tobacco: Never Used  Vaping Use  . Vaping Use: Never used  Substance and Sexual Activity  . Alcohol use: Yes    Comment: occasional glass of wine  . Drug use: No  . Sexual activity: Not on file  Other Topics Concern  . Not on file  Social History Narrative  . Not on file   Social Determinants of Health   Financial Resource Strain: Not on file  Food Insecurity: Not on file  Transportation Needs: Not on file  Physical Activity: Not on file  Stress: Not on file  Social Connections: Not on file  Intimate Partner Violence: Not on file    Outpatient Medications Prior to Visit  Medication Sig Dispense Refill  .  cyclobenzaprine (FLEXERIL) 5 MG tablet Take 1 tablet (5 mg total) by mouth at bedtime. 30 tablet 1  . diazepam (VALIUM) 5 MG tablet Take 1-2 tab PO 1 hour before procedure or imaging. 2 tablet 0  . esomeprazole (NEXIUM) 20 MG capsule Take 20 mg by mouth daily at 12 noon.    Marland Kitchen losartan (COZAAR) 50 MG tablet Take 1 tablet (50 mg total) by mouth daily. (Patient taking differently: Take 100 mg by mouth daily. Take two tablets by mouth once daily) 90 tablet 3  . pravastatin (PRAVACHOL) 40 MG tablet Take 1 tablet (40 mg total) by mouth daily. 90 tablet 3   No facility-administered medications prior to visit.    No Known Allergies  ROS Review of Systems  Constitutional: Negative for  fatigue.  Eyes: Negative for visual disturbance.  Respiratory: Negative for cough, chest tightness and shortness of breath.   Cardiovascular: Negative for chest pain, palpitations and leg swelling.  Gastrointestinal: Negative for abdominal pain.  Endocrine: Negative for polydipsia and polyuria.  Musculoskeletal: Positive for neck pain.  Neurological: Positive for headaches. Negative for dizziness, syncope, weakness and light-headedness.      Objective:    Physical Exam Vitals reviewed.  Constitutional:      Appearance: He is well-developed.  HENT:     Right Ear: External ear normal.     Left Ear: External ear normal.  Eyes:     Pupils: Pupils are equal, round, and reactive to light.  Neck:     Thyroid: No thyromegaly.  Cardiovascular:     Rate and Rhythm: Normal rate and regular rhythm.  Pulmonary:     Effort: Pulmonary effort is normal. No respiratory distress.     Breath sounds: Normal breath sounds. No wheezing or rales.  Musculoskeletal:     Cervical back: Neck supple.     Right lower leg: No edema.     Left lower leg: No edema.  Neurological:     General: No focal deficit present.     Mental Status: He is alert and oriented to person, place, and time.     Cranial Nerves: No cranial nerve deficit.     Motor: No weakness.     BP 120/70 (BP Location: Left Arm, Patient Position: Sitting, Cuff Size: Normal)   Pulse (!) 58   Temp 97.6 F (36.4 C) (Oral)   Ht 6\' 2"  (1.88 m)   Wt 232 lb 3.2 oz (105.3 kg)   SpO2 97%   BMI 29.81 kg/m  Wt Readings from Last 3 Encounters:  05/10/20 232 lb 3.2 oz (105.3 kg)  12/09/19 234 lb 9.6 oz (106.4 kg)  11/24/19 235 lb 12.8 oz (107 kg)     Health Maintenance Due  Topic Date Due  . COVID-19 Vaccine (1) Never done    There are no preventive care reminders to display for this patient.  Lab Results  Component Value Date   TSH 3.99 08/13/2016   Lab Results  Component Value Date   WBC 5.5 10/23/2017   HGB 13.8 10/23/2017    HCT 40.0 10/23/2017   MCV 84.6 10/23/2017   PLT 214.0 10/23/2017   Lab Results  Component Value Date   NA 140 12/17/2018   K 4.8 12/17/2018   CO2 32 12/17/2018   GLUCOSE 103 (H) 12/17/2018   BUN 18 12/17/2018   CREATININE 1.18 12/17/2018   BILITOT 0.8 12/17/2018   ALKPHOS 62 12/17/2018   AST 20 12/17/2018   ALT 35 12/17/2018   PROT  6.9 12/17/2018   ALBUMIN 4.5 12/17/2018   CALCIUM 9.7 12/17/2018   GFR 61.90 12/17/2018   Lab Results  Component Value Date   CHOL 188 12/17/2018   Lab Results  Component Value Date   HDL 62.60 12/17/2018   Lab Results  Component Value Date   LDLCALC 108 (H) 12/17/2018   Lab Results  Component Value Date   TRIG 87.0 12/17/2018   Lab Results  Component Value Date   CHOLHDL 3 12/17/2018   No results found for: HGBA1C    Assessment & Plan:   #1 hypertension suboptimally controlled on multiple home readings and repeat reading here today as well. -Increase losartan to 100 mg daily.  He will double up his 50 mg at home and give me some feedback in 3 to 4 weeks. -Continue regular exercise habits  #2 hyperlipidemia treated with pravastatin -Recheck lipid and hepatic panel  -#3 frequent almost daily headaches.  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  He has tried low-dose muscle relaxer with cyclobenzaprine in the past with mild improvement. -Given almost daily nature of headaches will set up neurology referral for further evaluation  #4 health maintenance recommend Prevnar 13.  Continue annual flu vaccine.  Discussed PSA screening and he would like to go ahead with that.  PSA ordered with labs above.  No orders of the defined types were placed in this encounter.   Follow-up: No follow-ups on file.    Evelena Peat, MD

## 2020-05-10 NOTE — Patient Instructions (Signed)
Increase the Losartan to 100 mg daily and give me some feedback in 3-4 weeks   Cervicogenic Headache  A cervicogenic headache is a headache caused by a condition that affects the bones and tissues in your neck (cervical spine). In a cervicogenic headache, the pain moves from your neck to your head. Most cervicogenic headaches start in the upper part of the neck with the first three cervical bones (cervical vertebrae). A cervicogenic headache is diagnosed when a cause can be found in the cervical spine and other causes of headaches can be ruled out. What are the causes? The most common cause of this condition is a traumatic injury to the cervical spine, such as whiplash. Other causes include:  Arthritis.  Broken bone (fracture).  Infection.  Tumor. What are the signs or symptoms? The most common symptoms are neck and head pain. The pain is often located on one side. In some cases, there may be head pain without neck pain. Pain may be felt in the neck, back or side of the head, face, or behind the eyes. Other symptoms include:  Limited movement in the neck.  Arm or shoulder pain. How is this diagnosed? This condition may be diagnosed based on:  Your symptoms.  A physical exam.  An injection that blocks nerve signals (nerve block).  Imaging tests, such as: ? X-rays. ? CT scan. ? MRI. How is this treated? Treatment for this condition may depend on the underlying condition. Treatment may include:  Medicines, such as: ? NSAIDs. ? Muscle relaxants.  Physical therapy.  Massage therapy.  Complementary therapies, such as: ? Biofeedback. ? Meditation. ? Acupuncture.  Nerve block injections.  Botulinum toxin injections. Your treatment plan may involve working with a pain management team that includes your primary health care provider, a pain management specialist, a neurologist, and a physical therapist. Follow these instructions at home:  Take over-the-counter and  prescription medicines only as told by your health care provider.  Do exercises at home as told by your physical therapist.  Return to your normal activities as told by your health care provider. Ask your health care provider what activities are safe for you. Avoid activities that trigger your headaches.  Maintain good neck support and posture at home and at work.  Keep all follow-up visits as told by your health care provider. This is important. Contact a health care provider if you have:  Headaches that are getting worse and happening more often.  Headaches with any of the following: ? Fever. ? Numbness. ? Weakness. ? Dizziness. ? Nausea or vomiting. Get help right away if:  You have a very sudden and severe headache. Summary  A cervicogenic headache is a headache caused by a condition that affects the bones and tissues in your cervical spine.  Your health care provider may diagnose this condition with a physical exam, a nerve block, and imaging tests.  Treatment may include medicine to reduce pain and inflammation, physical therapy, and nerve block injections.  Complementary therapies, such as acupuncture and meditation, may be added to other treatments.  Your treatment plan may involve working with a pain management team that includes your primary health care provider, a pain management specialist, a neurologist, and a physical therapist. This information is not intended to replace advice given to you by your health care provider. Make sure you discuss any questions you have with your health care provider. Document Revised: 11/26/2019 Document Reviewed: 11/26/2019 Elsevier Patient Education  2021 [employer].

## 2020-05-26 ENCOUNTER — Encounter: Payer: Self-pay | Admitting: Family Medicine

## 2020-05-26 MED ORDER — LOSARTAN POTASSIUM 100 MG PO TABS: 100.0000 mg | ORAL_TABLET | Freq: Every day | ORAL | 3 refills | Status: DC

## 2020-05-26 NOTE — Telephone Encounter (Signed)
Please advise. Per OV noted the patient was to increase his losartan to 100mg  and then send Dr. his readings at home.   Losartan 50mg  is still on the patients med list. Ok to send in a new prescription for 100mg ?

## 2020-05-29 ENCOUNTER — Encounter: Payer: Self-pay | Admitting: Family Medicine

## 2020-06-14 ENCOUNTER — Other Ambulatory Visit: Payer: Self-pay | Admitting: Family Medicine

## 2020-07-06 ENCOUNTER — Telehealth: Payer: Self-pay | Admitting: Family Medicine

## 2020-07-06 NOTE — Telephone Encounter (Signed)
Left message for patient to call back and schedule Medicare Annual Wellness Visit (AWV) either virtually or in office.   AWV-I per [place] 08/30/19  please schedule at anytime with [facility] Nurse Health Advisor 1 or 2   This should be a 45 minute visit. 

## 2020-07-06 NOTE — Telephone Encounter (Signed)
Pt is calling in stating that he has had one with Dr. Caryl Never and prefer not to do this visit

## 2020-08-10 ENCOUNTER — Ambulatory Visit (INDEPENDENT_AMBULATORY_CARE_PROVIDER_SITE_OTHER): Payer: Medicare Other | Admitting: Neurology

## 2020-08-10 ENCOUNTER — Encounter: Payer: Self-pay | Admitting: Neurology

## 2020-08-10 VITALS — BP 157/92 | HR 56 | Ht 74.0 in | Wt 228.0 lb

## 2020-08-10 DIAGNOSIS — M5481 Occipital neuralgia: Secondary | ICD-10-CM | POA: Diagnosis not present

## 2020-08-10 DIAGNOSIS — R519 Headache, unspecified: Secondary | ICD-10-CM

## 2020-08-10 DIAGNOSIS — R51 Headache with orthostatic component, not elsewhere classified: Secondary | ICD-10-CM
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
## 2020-08-10 NOTE — Patient Instructions (Signed)
Occipital nerve blocks(numbing agent and steroids topically applied to the nerves) Physical therapy, watch posture or anything else that can affect a nerve Injections medial branch blocks at c2/c3 - Pain management center  Radiofrequency ablation/gamma knife Medications  MRI of the brain (make sure get c2/c3 in the images please). Based on results will recommend next steps. If no c2/c3 involvement then I recommend occipital nerve blocks to start.   Occipital Neuralgia  Occipital neuralgia is a type of headache that causes brief episodes of very bad pain in the back of the head. Pain from occipital neuralgia may spread (radiate) to other parts of the head. These headaches may be caused by irritation of the nerves that leave the spinal cord high up in the neck, just below the base of the skull (occipital nerves). The occipital nerves transmit sensations from the back of the head, the topof the head, and the areas behind the ears. What are the causes? This condition can occur without any known cause (primary headache syndrome). In other cases, this condition is caused by pressure on or irritation of one of the two occipital nerves. Pressure and irritation may be due to: Muscle spasm in the neck. Neck injury. Wear and tear of the vertebrae in the neck (osteoarthritis). Disease of the disks that separate the vertebrae. Swollen blood vessels that put pressure on the occipital nerves. Infections. Tumors. Diabetes. What are the signs or symptoms? This condition causes brief burning, stabbing, electric, shocking, or shooting pain in the back of the head that can radiate to the top of the head. It can happen on one side or both sides of the head. It can also cause: Pain behind the eye. Pain triggered by neck movement or hair brushing. Scalp tenderness. Aching in the back of the head between episodes of very bad pain. Pain that gets worse with exposure to bright lights. How is this  diagnosed? Your health care provider may diagnose the condition based on a physical exam and your symptoms. Tests may be done, such as: Imaging studies of the brain and neck (cervical spine), such as an MRI or CT scan. These look for causes of pinched nerves. Applying pressure to the nerves in the neck to try to re-create the pain. Injection of numbing medicine into the occipital nerve areas to see if pain goes away (diagnostic nerve block). How is this treated? Treatment for this condition may begin with simple measures, such as: Rest. Massage. Applying heat or cold to the area. Over-the-counter pain relievers. If these measures do not work, you may need other treatments, including: Medicines, such as: Prescription-strength anti-inflammatory medicines. Muscle relaxants. Anti-seizure medicines, which can relieve pain. Antidepressants, which can relieve pain. Injected medicines, such as medicines that numb the area (local anesthetic) and steroids. Pulsed radiofrequency ablation. This is when wires are implanted to deliver electrical impulses that block pain signals from the occipital nerve. Surgery to relieve nerve pressure. Physical therapy. Follow these instructions at home: Managing pain     Avoid any activities that cause pain. Rest when you have an attack of pain. Try gentle massage to relieve pain. Try a different pillow or sleeping position. If directed, apply heat to the affected area as often as told by your health care provider. Use the heat source that your health care provider recommends, such as a moist heat pack or a heating pad. Place a towel between your skin and the heat source. Leave the heat on for 20-30 minutes. Remove the heat if your  skin turns bright red. This is especially important if you are unable to feel pain, heat, or cold. You have a greater risk of getting burned. If directed, put ice on the back of your head and neck area. To do this: Put ice in a  plastic bag. Place a towel between your skin and the bag. Leave the ice on for 20 minutes, 2-3 times a day. Remove the ice if your skin turns bright red. This is very important. If you cannot feel pain, heat, or cold, you have a greater risk of damage to the area. General instructions Take over-the-counter and prescription medicines only as told by your health care provider. Avoid things that make your symptoms worse, such as bright lights. Try to stay active. Get regular exercise that does not cause pain. Ask your health care provider to suggest safe exercises for you. Work with a physical therapist to learn stretching exercises you can do at home. Practice good posture. Keep all follow-up visits. This is important. Contact a health care provider if: Your medicine is not working. You have new or worsening symptoms. Get help right away if: You have very bad head pain that does not go away. You have a sudden change in vision, balance, or speech. These symptoms may represent a serious problem that is an emergency. Do not wait to see if the symptoms will go away. Get medical help right away. Call your local emergency services (911 in the [place] Do not drive yourself to the hospital. Summary Occipital neuralgia is a type of headache that causes brief episodes of very bad pain in the back of the head. Pain from occipital neuralgia may spread (radiate) to other parts of the head. Treatment for this condition includes rest, massage, and medicines. This information is not intended to replace advice given to you by your health care provider. Make sure you discuss any questions you have with your healthcare provider. Document Revised: 11/15/2019 Document Reviewed: 11/15/2019 Elsevier Patient Education  2022 [employer].

## 2020-08-14 ENCOUNTER — Other Ambulatory Visit: Payer: Self-pay | Admitting: Neurology

## 2020-08-14 MED ORDER — DIAZEPAM 5 MG PO TABS: ORAL_TABLET | ORAL | 0 refills | Status: DC

## 2020-08-15 ENCOUNTER — Telehealth: Payer: Self-pay | Admitting: Family Medicine

## 2020-08-15 NOTE — Telephone Encounter (Signed)
Left message for patient to call back and schedule Medicare Annual Wellness Visit (AWV) either virtually or in office.   AWV-I per [place] 08/30/19  please schedule at anytime with [facility] Nurse Health Advisor 1 or 2   This should be a 45 minute visit. 

## 2020-08-20 ENCOUNTER — Other Ambulatory Visit: Payer: Self-pay

## 2020-08-20 ENCOUNTER — Ambulatory Visit
Admission: RE | Admit: 2020-08-20 | Discharge: 2020-08-20 | Disposition: A | Discharge status: Home / Self Care | Payer: Medicare Other | Source: Ambulatory Visit | Attending: Neurology | Admitting: Neurology

## 2020-08-20 DIAGNOSIS — R51 Headache with orthostatic component, not elsewhere classified: Secondary | ICD-10-CM

## 2020-08-20 DIAGNOSIS — R519 Headache, unspecified: Secondary | ICD-10-CM

## 2020-08-20 MED ORDER — GADOBENATE DIMEGLUMINE 529 MG/ML IV SOLN
20.0000 mL | Freq: Once | INTRAVENOUS | Status: AC | PRN
  Administered 2020-08-20: 20 mL via INTRAVENOUS

## 2020-08-24 ENCOUNTER — Other Ambulatory Visit: Payer: Self-pay | Admitting: Neurology

## 2020-08-24 ENCOUNTER — Telehealth: Payer: Self-pay | Admitting: Neurology

## 2020-08-24 DIAGNOSIS — M5481 Occipital neuralgia: Secondary | ICD-10-CM

## 2020-08-24 NOTE — Telephone Encounter (Signed)
I LVM for patient to call back to schedule. There is a spot held for him with Aundra Millet 8/2 at 10:30am.

## 2020-08-24 NOTE — Telephone Encounter (Signed)
Please schedule patient for occipital nerve block. Can see me, Eric Duran (you can use a research spot) or [name], whoever has soonest appointment thanks

## 2020-08-25 ENCOUNTER — Telehealth: Payer: Self-pay | Admitting: *Deleted

## 2020-08-25 ENCOUNTER — Ambulatory Visit
Admission: RE | Admit: 2020-08-25 | Discharge: 2020-08-25 | Disposition: A | Discharge status: Home / Self Care | Payer: Medicare Other | Source: Ambulatory Visit | Attending: Pain Medicine | Admitting: Pain Medicine

## 2020-08-25 ENCOUNTER — Other Ambulatory Visit: Payer: Self-pay | Admitting: Pain Medicine

## 2020-08-25 ENCOUNTER — Other Ambulatory Visit: Payer: Self-pay

## 2020-08-25 DIAGNOSIS — M542 Cervicalgia: Secondary | ICD-10-CM

## 2020-08-25 NOTE — Telephone Encounter (Addendum)
Received a call from [facility].  She stated Dr Jordan Likes is requesting that MRI brain be reviewed by Dr Epimenio Foot again and if C2 and C3 were imaged to please addend report and include assessment of those levels. Patient was referred to Dr Jordan Likes for medial branch blocks or other pain procedures for occipital neuralgia. Fax report to [phone].

## 2020-08-25 NOTE — Telephone Encounter (Addendum)
R/c CD front [facility]. CD in nurse pod.

## 2020-08-25 NOTE — Telephone Encounter (Signed)
MRI brain report has been updated by Dr Epimenio Foot. Report printed and faxed to [facility] at attention of Asher Muir and Dr Jordan Likes. Received a receipt of confirmation.

## 2020-08-25 NOTE — Telephone Encounter (Signed)
Pt returned phone call  do not want to schedule nerve block until after I meet with Dr. Jordan Likes.

## 2020-08-25 NOTE — Telephone Encounter (Signed)
Noted, thanks!

## 2020-09-15 ENCOUNTER — Ambulatory Visit: Payer: Medicare Other | Admitting: Physical Therapy

## 2020-09-22 ENCOUNTER — Encounter: Payer: Self-pay | Admitting: Physical Therapy

## 2020-09-22 ENCOUNTER — Other Ambulatory Visit: Payer: Self-pay

## 2020-09-22 ENCOUNTER — Ambulatory Visit: Payer: Medicare Other | Attending: Family Medicine | Admitting: Physical Therapy

## 2020-09-22 DIAGNOSIS — G4486 Cervicogenic headache: Secondary | ICD-10-CM | POA: Diagnosis present

## 2020-09-22 DIAGNOSIS — M542 Cervicalgia: Secondary | ICD-10-CM | POA: Insufficient documentation

## 2020-09-22 NOTE — Patient Instructions (Signed)
Access Code: [email or web address] URL: [email or web address] Date: 09/22/2020 Prepared by: Lavinia Sharps  Exercises Supine Suboccipital Release with Tennis Balls - 1 x daily - 7 x weekly - 1 reps - 60 hold Supine Chin Tuck - 1 x daily - 7 x weekly - 1 sets - 7-8 reps Seated Cervical Retraction - 1 x daily - 7 x weekly - 1 sets - 7-8 reps
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
## 2020-09-29 ENCOUNTER — Ambulatory Visit: Payer: Medicare Other | Attending: Family Medicine | Admitting: Physical Therapy

## 2020-09-29 ENCOUNTER — Other Ambulatory Visit: Payer: Self-pay

## 2020-09-29 DIAGNOSIS — M542 Cervicalgia: Secondary | ICD-10-CM | POA: Insufficient documentation

## 2020-09-29 DIAGNOSIS — G4486 Cervicogenic headache: Secondary | ICD-10-CM | POA: Insufficient documentation

## 2020-09-29 NOTE — Patient Instructions (Signed)
Access Code: [email or web address] URL: [email or web address] Date: 09/29/2020 Prepared by: Lavinia Sharps  Exercises Supine Suboccipital Release with Tennis Balls - 1 x daily - 7 x weekly - 1 reps - 60 hold Supine Chin Tuck - 1 x daily - 7 x weekly - 1 sets - 7-8 reps Seated Cervical Retraction - 1 x daily - 7 x weekly - 1 sets - 7-8 reps Seated Assisted Cervical Rotation with Towel - 1 x daily - 7 x weekly - 1 sets - 10 reps PILATES ROW AND ROTATE - 1 x daily - 7 x weekly - 2 sets - 10 reps

## 2020-09-29 NOTE — Therapy (Signed)
[facility] [address], [address] [place], [place], [address] Phone: [phone]   Fax:  [phone]  Physical Therapy Treatment  Patient Details  Name: Eric Duran MRN: [id number] Date of Birth: 1955/08/04 Referring Provider (PT): Dr. Lucia Gaskins   Encounter Date: 09/29/2020   PT End of Session - 09/29/20 1454     Visit Number 2    Date for PT Re-Evaluation 11/17/20    Authorization Type Medicare/BCBS 10th visit progress note    PT Start Time 1145    PT Stop Time 1226    PT Time Calculation (min) 41 min    Activity Tolerance Patient tolerated treatment well             Past Medical History:  Diagnosis Date   ANEMIA, IRON DEFICIENCY 07/22/2009   ASTHMA 07/22/2009   mild   GERD 07/22/2009   HYPERLIPIDEMIA 07/22/2009   Kidney stones 2001   6 times in past   OTITIS MEDIA, SUPPURATIVE 11/09/2009   PREDIABETES 07/22/2009   pt does not monitor cbg at home    Past Surgical History:  Procedure Laterality Date   BACK SURGERY     lumbar x 2   BIOPSY BOWEL  01/30/1980   exploration bowel surgery, chrons disease   CHOLECYSTECTOMY  02/26/2011   Procedure: LAPAROSCOPIC CHOLECYSTECTOMY WITH INTRAOPERATIVE CHOLANGIOGRAM;  Surgeon: Velora Heckler, MD;  Location: [facility];  Service: General;  Laterality: N/A;   colonscopy  2012 and 2006   HERNIA REPAIR  2005 - approx   TONSILLECTOMY  1960 - approx    There were no vitals filed for this visit.   Subjective Assessment - 09/29/20 1144     Subjective Maybe a little better but sore from doing the exercises.  Headaches every other to every 3rd day.    Currently in Pain? Yes    Pain Score 1     Pain Location Neck    Pain Type Chronic pain                               [facility] Adult PT Treatment/Exercise - 09/29/20 0001       Neck Exercises: Standing   Other Standing Exercises green band row and rotate shoulders (eyes fixed straight ahead) 15x right/left       Neck Exercises: Seated   Other Seated Exercise Mulligan SNAG with towel 10x right/left      Neck Exercises: Supine   Other Supine Exercise soft yellow ball rotations      Manual Therapy   Manual therapy comments cervical distraction 30 sec holds 8x;  upper cervical flexion mobilization grade 3 8x    Joint Mobilization PA mobs C4-C7 20 sec each level 3x each    Soft tissue mobilization upper traps, cervical paraspinals    Myofascial Release suboccipital release                    PT Education - 09/29/20 1454     Education Details Mulligan SNAG with towel;  row and rotate with band eyes straight    Person(s) Educated Patient    Methods Explanation;Demonstration;Handout    Comprehension Returned demonstration;Verbalized understanding              PT Short Term Goals - 09/22/20 1254       PT SHORT TERM GOAL #1   Title Pt to be independent with initial HEP    Time  4    Period Weeks    Status New    Target Date 10/20/20      PT SHORT TERM GOAL #2   Title The patient will report decrease in headache to every 3-4 days or less    Time 4    Period Weeks    Status New      PT SHORT TERM GOAL #3   Title Improved cervical flexion to 55 degrees, cervical retraction to neutral; rotation to 40 degrees bil    Time 4    Period Weeks    Status New               PT Long Term Goals - 09/22/20 1256       PT LONG TERM GOAL #1   Title The patient will report an overall improvement in headache frequency and lower neck pain by 60%    Time 8    Period Weeks    Status New    Target Date 11/17/20      PT LONG TERM GOAL #2   Title Improved cervical sidebending ROM to 35 degrees and rotation to 45 degrees needed for driving    Time 8    Period Weeks    Status New      PT LONG TERM GOAL #3   Title The patient will be independent in self management and ex strategies to complement his work out routine    Time 8    Period Weeks    Status New      PT LONG TERM GOAL  #4   Title FOTO outcome score improved to 70%    Time 8    Period Weeks    Status New                   Plan - 09/29/20 1229     Clinical Impression Statement Improved cervical retration mobility noted in sitting indicating improved upper cervical spine flexion.  Good response to manual therapy interventions particularly suboccipital release and upper cervical flexion mobilization.  Dizziness with position change quickly dissipates in < 30 sec.  Added upper cervical rotation movements to HEP with improved ROM noted with repetition.  Therapist monitoring response throughout treatment session and providing cues for best technique to optimize effectiveness.    Personal Factors and Comorbidities Time since onset of injury/illness/exacerbation    Rehab Potential Good    PT Frequency 2x / week    PT Duration 8 weeks    PT Treatment/Interventions ADLs/Self Care Home Management;Cryotherapy;Electrical Stimulation;Ultrasound;Traction;Moist Heat;Therapeutic activities;Therapeutic exercise;Neuromuscular re-education;Manual techniques;Patient/family education;Dry needling;Taping    PT Next Visit Plan Manual therapy upper and lower cervical joint mobility and suboccipital release;   upper cervical flexion mobilization;  hold on DN secondary to negative painful past experience with LBP;  cervical retractions and upper cervical spine flexion movements;  no cervical extension secondary to dizziness; also dizzy with transitional movements    PT Home Exercise Plan [email or web address]             Patient will benefit from skilled therapeutic intervention in order to improve the following deficits and impairments:  Increased fascial restricitons, Decreased range of motion, Increased muscle spasms, Pain  Visit Diagnosis: Cervicogenic headache  Cervicalgia     Problem List Patient Active Problem List   Diagnosis Date Noted   Previous back surgery 11/24/2019   Hypertension 08/20/2016   Gallstones  02/22/2011   Abdominal pain, right upper quadrant 02/22/2011   Crohn's 02/13/2011  OTITIS MEDIA, SUPPURATIVE 11/09/2009   Dyslipidemia 07/22/2009   ANEMIA, IRON DEFICIENCY 07/22/2009   ASTHMA 07/22/2009   GERD 07/22/2009   PREDIABETES 07/22/2009   NONSPECIFIC ABNORM RESULTS KIDNEY FUNCTION STUDY 07/22/2009   Lavinia Sharps, PT 09/29/20 6:00 PM Phone: [phone] Fax: [phone]  Vivien Presto 09/29/2020, 6:00 PM  [facility] [address] [place], [place], [address] Phone: [phone]   Fax:  [phone]  Name: KENNET MCCORT MRN: [id number] Date of Birth: 12-Aug-1955

## 2020-10-04 ENCOUNTER — Other Ambulatory Visit: Payer: Self-pay

## 2020-10-04 ENCOUNTER — Ambulatory Visit: Payer: Medicare Other | Admitting: Physical Therapy

## 2020-10-04 ENCOUNTER — Encounter: Payer: Self-pay | Admitting: Physical Therapy

## 2020-10-04 DIAGNOSIS — G4486 Cervicogenic headache: Secondary | ICD-10-CM

## 2020-10-04 DIAGNOSIS — M542 Cervicalgia: Secondary | ICD-10-CM

## 2020-10-04 NOTE — Patient Instructions (Signed)
Access Code: [email or web address] URL: [email or web address] Date: 10/04/2020 Prepared by: Raynelle Fanning  Exercises Supine Suboccipital Release with Tennis Balls - 1 x daily - 7 x weekly - 1 reps - 60 hold Supine Chin Tuck - 1 x daily - 7 x weekly - 1 sets - 7-8 reps Seated Cervical Retraction - 1 x daily - 7 x weekly - 1 sets - 7-8 reps Seated Assisted Cervical Rotation with Towel - 1 x daily - 7 x weekly - 1 sets - 10 reps PILATES ROW AND ROTATE - 1 x daily - 7 x weekly - 2 sets - 10 reps Standing Quadratus Lumborum Stretch with Doorway (Mirrored) - 2 x daily - 7 x weekly - 1 sets - 2 reps - 60 sec hold

## 2020-10-04 NOTE — Therapy (Signed)
[facility] [address], [address] [place], [place], [address] Phone: [phone]   Fax:  [phone]  Physical Therapy Treatment  Patient Details  Name: Eric Duran MRN: [id number] Date of Birth: 06/17/1955 Referring Provider (PT): Dr. Lucia Gaskins   Encounter Date: 10/04/2020   PT End of Session - 10/04/20 1352     Visit Number 3    Date for PT Re-Evaluation 11/17/20    Authorization Type Medicare/BCBS 10th visit progress note    PT Start Time 1355    PT Stop Time 1428   pt had to leave early to pick up grandchild   PT Time Calculation (min) 33 min    Activity Tolerance Patient tolerated treatment well    Behavior During Therapy [facility] for tasks assessed/performed             Past Medical History:  Diagnosis Date   ANEMIA, IRON DEFICIENCY 07/22/2009   ASTHMA 07/22/2009   mild   GERD 07/22/2009   HYPERLIPIDEMIA 07/22/2009   Kidney stones 2001   6 times in past   OTITIS MEDIA, SUPPURATIVE 11/09/2009   PREDIABETES 07/22/2009   pt does not monitor cbg at home    Past Surgical History:  Procedure Laterality Date   BACK SURGERY     lumbar x 2   BIOPSY BOWEL  01/30/1980   exploration bowel surgery, chrons disease   CHOLECYSTECTOMY  02/26/2011   Procedure: LAPAROSCOPIC CHOLECYSTECTOMY WITH INTRAOPERATIVE CHOLANGIOGRAM;  Surgeon: Velora Heckler, MD;  Location: [facility];  Service: General;  Laterality: N/A;   colonscopy  2012 and 2006   HERNIA REPAIR  2005 - approx   TONSILLECTOMY  1960 - approx    There were no vitals filed for this visit.   Subjective Assessment - 10/04/20 1429     Subjective I haven't been using the computer much lately and then when I did this morning, the HA came on within 15 min.    Pertinent History Back surgeries 2x in 6 month    Diagnostic tests Had MRI of neck and brain diagnosed with occipital neuralgia    Patient Stated Goals Diminish headaches    Currently in Pain? No/denies                                [facility] Adult PT Treatment/Exercise - 10/04/20 0001       Neck Exercises: Standing   Other Standing Exercises QL stretch in doorway x 60 sec bil      Manual Therapy   Manual therapy comments cervical distraction 30 sec holds 8x;  upper cervical flexion mobilization grade 3 8x    Joint Mobilization PA mobs C4-C7 20 sec each level    Soft tissue mobilization cervical paraspinals and suboccipitals    Myofascial Release suboccipital release; right QL release in [facility]                    PT Education - 10/04/20 1428     Education Details added QL stretch for tight right lumbar    Person(s) Educated Patient    Methods Explanation;Demonstration;Handout    Comprehension Verbalized understanding;Returned demonstration              PT Short Term Goals - 09/22/20 1254       PT SHORT TERM GOAL #1   Title Pt to be independent with initial HEP    Time 4  Period Weeks    Status New    Target Date 10/20/20      PT SHORT TERM GOAL #2   Title The patient will report decrease in headache to every 3-4 days or less    Time 4    Period Weeks    Status New      PT SHORT TERM GOAL #3   Title Improved cervical flexion to 55 degrees, cervical retraction to neutral; rotation to 40 degrees bil    Time 4    Period Weeks    Status New               PT Long Term Goals - 09/22/20 1256       PT LONG TERM GOAL #1   Title The patient will report an overall improvement in headache frequency and lower neck pain by 60%    Time 8    Period Weeks    Status New    Target Date 11/17/20      PT LONG TERM GOAL #2   Title Improved cervical sidebending ROM to 35 degrees and rotation to 45 degrees needed for driving    Time 8    Period Weeks    Status New      PT LONG TERM GOAL #3   Title The patient will be independent in self management and ex strategies to complement his work out routine    Time 8    Period Weeks    Status New      PT  LONG TERM GOAL #4   Title FOTO outcome score improved to 70%    Time 8    Period Weeks    Status New                   Plan - 10/04/20 1431     Clinical Impression Statement Patient reports he had return of HA this morning after using computer for 15 min. He plans to contact his eye doctor about getting glasses just for the computer. He denies pain upon arrival. He still is experiencing dizziness with positional changes. Right suboccipitals and C2-C4 are tighter with mobs and STM today. Assessment of lumbar paraspinals reveals increased tightness in his right lumbar and QL which may be providing added tension to cspine. QL stretch issued for HEP.    PT Treatment/Interventions ADLs/Self Care Home Management;Cryotherapy;Electrical Stimulation;Ultrasound;Traction;Moist Heat;Therapeutic activities;Therapeutic exercise;Neuromuscular re-education;Manual techniques;Patient/family education;Dry needling;Taping    PT Next Visit Plan Assess response to QL stretch. continue with manual therapy upper and lower cervical joint mobility and suboccipital release;   upper cervical flexion mobilization;  hold on DN secondary to negative painful past experience with LBP;  cervical retractions and upper cervical spine flexion movements;  no cervical extension secondary to dizziness; also dizzy with transitional movements             Patient will benefit from skilled therapeutic intervention in order to improve the following deficits and impairments:  Increased fascial restricitons, Decreased range of motion, Increased muscle spasms, Pain  Visit Diagnosis: Cervicogenic headache  Cervicalgia     Problem List Patient Active Problem List   Diagnosis Date Noted   Previous back surgery 11/24/2019   Hypertension 08/20/2016   Gallstones 02/22/2011   Abdominal pain, right upper quadrant 02/22/2011   Crohn's 02/13/2011   OTITIS MEDIA, SUPPURATIVE 11/09/2009   Dyslipidemia 07/22/2009   ANEMIA, IRON  DEFICIENCY 07/22/2009   ASTHMA 07/22/2009   GERD 07/22/2009   PREDIABETES 07/22/2009  NONSPECIFIC ABNORM RESULTS KIDNEY FUNCTION STUDY 07/22/2009    Solon Palm PT 10/04/2020, 2:39 PM  [facility] [address], [address] [place], [place], [address] Phone: [phone]   Fax:  [phone]  Name: JAELAN RASHEED MRN: [id number] Date of Birth: 08-Dec-1955

## 2020-10-06 ENCOUNTER — Ambulatory Visit: Payer: Medicare Other | Admitting: Physical Therapy

## 2020-10-06 ENCOUNTER — Encounter: Payer: Self-pay | Admitting: Physical Therapy

## 2020-10-06 ENCOUNTER — Other Ambulatory Visit: Payer: Self-pay

## 2020-10-06 DIAGNOSIS — M542 Cervicalgia: Secondary | ICD-10-CM

## 2020-10-06 DIAGNOSIS — G4486 Cervicogenic headache: Secondary | ICD-10-CM | POA: Diagnosis not present

## 2020-10-06 NOTE — Therapy (Signed)
[facility] [address] [place], [place], [address] Phone: [phone]   Fax:  [phone]  Physical Therapy Treatment  Patient Details  Name: Eric Duran MRN: [id number] Date of Birth: [birth year] Referring Provider (PT): Dr. Jaynee Eagles   Encounter Date: 10/06/2020   PT End of Session - 10/06/20 1353     Visit Number 4    Date for PT Re-Evaluation 11/17/20    Authorization Type Medicare/BCBS 10th visit progress note    Progress Note Due on Visit 10    PT Start Time 8937    PT Stop Time 1433    PT Time Calculation (min) 38 min    Activity Tolerance Patient tolerated treatment well    Behavior During Therapy [facility] for tasks assessed/performed             Past Medical History:  Diagnosis Date   ANEMIA, IRON DEFICIENCY 07/22/2009   ASTHMA 07/22/2009   mild   GERD 07/22/2009   HYPERLIPIDEMIA 07/22/2009   Kidney stones 2001   6 times in past   [place], SUPPURATIVE 11/09/2009   PREDIABETES 07/22/2009   pt does not monitor cbg at home    Past Surgical History:  Procedure Laterality Date   BACK SURGERY     lumbar x 2   BIOPSY BOWEL  01/30/1980   exploration bowel surgery, chrons disease   CHOLECYSTECTOMY  02/26/2011   Procedure: LAPAROSCOPIC CHOLECYSTECTOMY WITH INTRAOPERATIVE CHOLANGIOGRAM;  Surgeon: Earnstine Regal, MD;  Location: [facility];  Service: General;  Laterality: N/A;   colonscopy  2012 and 2006   HERNIA REPAIR  2005 - approx   TONSILLECTOMY  1960 - approx    There were no vitals filed for this visit.   Subjective Assessment - 10/06/20 1356     Subjective Increased soreness after last visit. No HA since last visit, but not longer than 15 min on computer.    Diagnostic tests Had MRI of neck and brain diagnosed with occipital neuralgia    Patient Stated Goals Diminish headaches    Currently in Pain? No/denies                [facility] PT Assessment - 10/06/20 0001       AROM   Overall AROM  Comments retraction close to neutral but engages UTs without VCs    Cervical Flexion 45    Cervical - Right Rotation 43    Cervical - Left Rotation 45                           [facility] Adult PT Treatment/Exercise - 10/06/20 0001       Exercises   Exercises Neck      Neck Exercises: Standing   Neck Retraction 10 reps    Other Standing Exercises green band row and rotate shoulders (eyes fixed straight ahead) 3x5 right/left; on noodle ER with GTB 2x10; depression 5 sec x 5    Other Standing Exercises MFR with small ball at T1/2 region      Neck Exercises: Supine   Other Supine Exercise bil cerv rotation 5 sec x 5 with noodle under neck      Neck Exercises: Prone   Other Prone Exercise prone on elbows with neck diagonals x 10 each way      Manual Therapy   Manual Therapy Myofascial release    Joint Mobilization PA and right lateral glides to cervical spine; PA to  lower cervical upper thoracic    Myofascial Release suboccipital release; right QL release in [facility]                       PT Short Term Goals - 10/06/20 1437       PT SHORT TERM GOAL #1   Title Pt to be independent with initial HEP    Status Partially Met      PT SHORT TERM GOAL #2   Title The patient will report decrease in headache to every 3-4 days or less    Status Achieved      PT SHORT TERM GOAL #3   Title Improved cervical flexion to 55 degrees, cervical retraction to neutral; rotation to 40 degrees bil    Status On-going               PT Long Term Goals - 09/22/20 1256       PT LONG TERM GOAL #1   Title The patient will report an overall improvement in headache frequency and lower neck pain by 60%    Time 8    Period Weeks    Status New    Target Date 11/17/20      PT LONG TERM GOAL #2   Title Improved cervical sidebending ROM to 35 degrees and rotation to 45 degrees needed for driving    Time 8    Period Weeks    Status New      PT LONG TERM GOAL #3   Title  The patient will be independent in self management and ex strategies to complement his work out routine    Time 8    Period Weeks    Status New      PT LONG TERM GOAL #4   Title FOTO outcome score improved to 70%    Time 8    Period Weeks    Status New                   Plan - 10/06/20 1438     Clinical Impression Statement Patient is progressing with ROM and headaches have decreased in frequency. He is still tight in the right cervical spine and has bil pain at C7/T1 to T2/3 region. He was able to tolerate thoracic ext in standing without dizziness today. He would benefit from PA mobs in this region. Patient reports crepitis with cervical diagonals and rotation. He did well with upper back strengthening today but required cueing to not engage upper traps. LTGs are ongoing.    PT Frequency 2x / week    PT Duration 8 weeks    PT Treatment/Interventions ADLs/Self Care Home Management;Cryotherapy;Electrical Stimulation;Ultrasound;Traction;Moist Heat;Therapeutic activities;Therapeutic exercise;Neuromuscular re-education;Manual techniques;Patient/family education;Dry needling;Taping    PT Next Visit Plan continue with manual therapy upper and lower cervical joint mobility and upper T spine; suboccipital release;   upper cervical flexion mobilization;  hold on DN secondary to negative painful past experience with LBP;  cervical retractions and upper cervical spine flexion movements;  no cervical extension secondary to dizziness; also dizzy with transitional movements    PT Home Exercise Plan [email or web address]    Consulted and Agree with Plan of Care Patient             Patient will benefit from skilled therapeutic intervention in order to improve the following deficits and impairments:  Increased fascial restricitons, Decreased range of motion, Increased muscle spasms, Pain  Visit Diagnosis: Cervicogenic headache  Cervicalgia  Problem List Patient Active Problem List   Diagnosis  Date Noted   Previous back surgery 11/24/2019   Hypertension 08/20/2016   Gallstones 02/22/2011   Abdominal pain, right upper quadrant 02/22/2011   Crohn's 02/13/2011   OTITIS MEDIA, SUPPURATIVE 11/09/2009   Dyslipidemia 07/22/2009   ANEMIA, IRON DEFICIENCY 07/22/2009   ASTHMA 07/22/2009   GERD 07/22/2009   PREDIABETES 07/22/2009   NONSPECIFIC ABNORM RESULTS KIDNEY FUNCTION STUDY 07/22/2009    Madelyn Flavors PT 10/06/2020, 5:36 PM   [facility] [address], [place], [place], [address] Phone: [phone]   Fax:  [phone]  Name: Eric Duran MRN: [id number] Date of Birth: [birth year]

## 2020-10-12 ENCOUNTER — Telehealth: Payer: Self-pay | Admitting: Family Medicine

## 2020-10-12 NOTE — Telephone Encounter (Signed)
Left message for patient to call back and schedule Medicare Annual Wellness Visit (AWV) either virtually or in office. Left  my jabber number [phone]    AWV-I per [place] 08/30/19  please schedule at anytime with [facility] Nurse Health Advisor 1 or 2   This should be a 45 minute visit.  

## 2020-10-18 ENCOUNTER — Other Ambulatory Visit: Payer: Self-pay

## 2020-10-18 ENCOUNTER — Ambulatory Visit: Payer: Medicare Other | Admitting: Physical Therapy

## 2020-10-18 DIAGNOSIS — M542 Cervicalgia: Secondary | ICD-10-CM

## 2020-10-18 DIAGNOSIS — G4486 Cervicogenic headache: Secondary | ICD-10-CM

## 2020-10-18 NOTE — Patient Instructions (Signed)
Access Code: [email or web address] URL: [email or web address] Date: 10/18/2020 Prepared by: Lavinia Sharps  Exercises Supine Suboccipital Release with Tennis Balls - 1 x daily - 7 x weekly - 1 reps - 60 hold Supine Chin Tuck - 1 x daily - 7 x weekly - 1 sets - 7-8 reps Seated Cervical Retraction - 1 x daily - 7 x weekly - 1 sets - 7-8 reps Seated Assisted Cervical Rotation with Towel - 1 x daily - 7 x weekly - 1 sets - 10 reps PILATES ROW AND ROTATE - 1 x daily - 7 x weekly - 2 sets - 10 reps Standing Quadratus Lumborum Stretch with Doorway (Mirrored) - 2 x daily - 7 x weekly - 1 sets - 2 reps - 60 sec hold Prone W Scapular Retraction - 1 x daily - 7 x weekly - 2 sets - 10 reps

## 2020-10-18 NOTE — Therapy (Signed)
[facility] [address] [place], [place], [address] Phone: [phone]   Fax:  [phone]  Physical Therapy Treatment  Patient Details  Name: Eric Duran MRN: [id number] Date of Birth: [birth year] Referring Provider (PT): Dr. Jaynee Eagles   Encounter Date: 10/18/2020   PT End of Session - 10/18/20 1228     Visit Number 5    Date for PT Re-Evaluation 11/17/20    Authorization Type Medicare/BCBS 10th visit progress note    Progress Note Due on Visit 10    PT Start Time 1143    PT Stop Time 1227    PT Time Calculation (min) 44 min    Activity Tolerance Patient tolerated treatment well             Past Medical History:  Diagnosis Date   ANEMIA, IRON DEFICIENCY 07/22/2009   ASTHMA 07/22/2009   mild   GERD 07/22/2009   HYPERLIPIDEMIA 07/22/2009   Kidney stones 2001   6 times in past   [place], SUPPURATIVE 11/09/2009   PREDIABETES 07/22/2009   pt does not monitor cbg at home    Past Surgical History:  Procedure Laterality Date   BACK SURGERY     lumbar x 2   BIOPSY BOWEL  01/30/1980   exploration bowel surgery, chrons disease   CHOLECYSTECTOMY  02/26/2011   Procedure: LAPAROSCOPIC CHOLECYSTECTOMY WITH INTRAOPERATIVE CHOLANGIOGRAM;  Surgeon: Earnstine Regal, MD;  Location: [facility];  Service: General;  Laterality: N/A;   colonscopy  2012 and 2006   HERNIA REPAIR  2005 - approx   TONSILLECTOMY  1960 - approx    There were no vitals filed for this visit.   Subjective Assessment - 10/18/20 1146     Subjective Headaches are pretty good.  I haven't been on the computer much.    Pertinent History Back surgeries 2x in 6 month    Currently in Pain? No/denies    Pain Score 0-No pain    Pain Location Neck    Pain Type Chronic pain                               [facility] Adult PT Treatment/Exercise - 10/18/20 0001       Self-Care   Posture use of lumbar roll and how affects cervical  alignment      Neck Exercises: Machines for Strengthening   Lat Pull 35# 15x seated    Other Machines for Strengthening 35# vertical row seated 15x      Neck Exercises: Standing   Other Standing Exercises blue band rows with eyes ahead 20x each side      Neck Exercises: Prone   Axial Extension Limitations head on towel roll with bil UE row/press overhead 10x    Other Prone Exercise prone on elbows with chin tuck 10x    Other Prone Exercise head off the table with UE Ws 10x      Neck Exercises: Stretches   Other Neck Stretches quadruped thread the needle thoracic rotation 5x each side   a little dizzy with this                    PT Education - 10/18/20 1228     Education Details prone head on/off table with UE motions    Person(s) Educated Patient    Methods Explanation;Demonstration;Handout    Comprehension Returned demonstration;Verbalized understanding  PT Short Term Goals - 10/06/20 1437       PT SHORT TERM GOAL #1   Title Pt to be independent with initial HEP    Status Partially Met      PT SHORT TERM GOAL #2   Title The patient will report decrease in headache to every 3-4 days or less    Status Achieved      PT SHORT TERM GOAL #3   Title Improved cervical flexion to 55 degrees, cervical retraction to neutral; rotation to 40 degrees bil    Status On-going               PT Long Term Goals - 09/22/20 1256       PT LONG TERM GOAL #1   Title The patient will report an overall improvement in headache frequency and lower neck pain by 60%    Time 8    Period Weeks    Status New    Target Date 11/17/20      PT LONG TERM GOAL #2   Title Improved cervical sidebending ROM to 35 degrees and rotation to 45 degrees needed for driving    Time 8    Period Weeks    Status New      PT LONG TERM GOAL #3   Title The patient will be independent in self management and ex strategies to complement his work out routine    Time 8    Period  Weeks    Status New      PT LONG TERM GOAL #4   Title FOTO outcome score improved to 70%    Time 8    Period Weeks    Status New                   Plan - 10/18/20 1241     Clinical Impression Statement Patient reports less headache since not on the computer.  Discussed sitting postural alignment and the effect of lumbar rounding and forward head vs lumbar lordosis and neutral head position.  Progressed ex's to included postural strengthening in prone with verbal cues to keep head in neutral rather than in neck extension.  Reports lower cervical "popping" and "feeling the muscles working" around C7 but not painful and no headache.    Personal Factors and Comorbidities Time since onset of injury/illness/exacerbation    Examination-Participation Restrictions Interpersonal Relationship;Driving    Rehab Potential Good    PT Frequency 2x / week    PT Duration 8 weeks    PT Treatment/Interventions ADLs/Self Care Home Management;Cryotherapy;Electrical Stimulation;Ultrasound;Traction;Moist Heat;Therapeutic activities;Therapeutic exercise;Neuromuscular re-education;Manual techniques;Patient/family education;Dry needling;Taping    PT Next Visit Plan recheck cervical ROM for STG;  check response with prone ex's, lat bar and rows;  continue with manual therapy upper and lower cervical joint mobility and upper T spine; suboccipital release;   upper cervical flexion mobilization;  hold on DN secondary to negative painful past experience with LBP;  cervical retractions and upper cervical spine flexion movements;  no cervical extension secondary to dizziness; also dizzy with transitional movements    PT Home Exercise Plan [email or web address]             Patient will benefit from skilled therapeutic intervention in order to improve the following deficits and impairments:  Increased fascial restricitons, Decreased range of motion, Increased muscle spasms, Pain  Visit Diagnosis: Cervicogenic  headache  Cervicalgia     Problem List Patient Active Problem List   Diagnosis Date Noted  Previous back surgery 11/24/2019   Hypertension 08/20/2016   Gallstones 02/22/2011   Abdominal pain, right upper quadrant 02/22/2011   Crohn's 02/13/2011   OTITIS MEDIA, SUPPURATIVE 11/09/2009   Dyslipidemia 07/22/2009   ANEMIA, IRON DEFICIENCY 07/22/2009   ASTHMA 07/22/2009   GERD 07/22/2009   PREDIABETES 07/22/2009   NONSPECIFIC ABNORM RESULTS KIDNEY FUNCTION STUDY 07/22/2009   Ruben Im, PT 10/18/20 12:47 PM Phone: [phone] Fax: [phone]  Alvera Singh, PT 10/18/2020, 12:47 PM   [facility] [address] [place], [place], [address] Phone: [phone]   Fax:  [phone]  Name: Eric Duran MRN: [id number] Date of Birth: 11/12/1955

## 2020-10-25 ENCOUNTER — Ambulatory Visit: Payer: Medicare Other

## 2020-10-25 ENCOUNTER — Other Ambulatory Visit: Payer: Self-pay

## 2020-10-25 DIAGNOSIS — M542 Cervicalgia: Secondary | ICD-10-CM

## 2020-10-25 DIAGNOSIS — G4486 Cervicogenic headache: Secondary | ICD-10-CM | POA: Diagnosis not present

## 2020-10-25 NOTE — Therapy (Signed)
[facility] [address] [place], [place], [address] Phone: [phone]   Fax:  [phone]  Physical Therapy Treatment  Patient Details  Name: Eric Duran MRN: [id number] Date of Birth: [birth year] Referring Provider (PT): Dr. Jaynee Eagles   Encounter Date: 10/25/2020   PT End of Session - 10/25/20 1230     Visit Number 6    Date for PT Re-Evaluation 11/17/20    Authorization Type Medicare/BCBS 10th visit progress note    Progress Note Due on Visit 10    PT Start Time 1147    PT Stop Time 1230    PT Time Calculation (min) 43 min    Activity Tolerance Patient tolerated treatment well    Behavior During Therapy [facility] for tasks assessed/performed             Past Medical History:  Diagnosis Date   ANEMIA, IRON DEFICIENCY 07/22/2009   ASTHMA 07/22/2009   mild   GERD 07/22/2009   HYPERLIPIDEMIA 07/22/2009   Kidney stones 2001   6 times in past   [place], SUPPURATIVE 11/09/2009   PREDIABETES 07/22/2009   pt does not monitor cbg at home    Past Surgical History:  Procedure Laterality Date   BACK SURGERY     lumbar x 2   BIOPSY BOWEL  01/30/1980   exploration bowel surgery, chrons disease   CHOLECYSTECTOMY  02/26/2011   Procedure: LAPAROSCOPIC CHOLECYSTECTOMY WITH INTRAOPERATIVE CHOLANGIOGRAM;  Surgeon: Earnstine Regal, MD;  Location: [facility];  Service: General;  Laterality: N/A;   colonscopy  2012 and 2006   HERNIA REPAIR  2005 - approx   TONSILLECTOMY  1960 - approx    There were no vitals filed for this visit.   Subjective Assessment - 10/25/20 1143     Subjective Exercises are going well.  The lat exercise made my muscles sore, it has gone away.    Pertinent History Back surgeries 2x in 6 month    Currently in Pain? No/denies                [facility] PT Assessment - 10/25/20 0001       AROM   Cervical Flexion 45    Cervical - Right Side Bend 30    Cervical - Left Side Bend 40    Cervical -  Right Rotation 60    Cervical - Left Rotation 75                           [facility] Adult PT Treatment/Exercise - 10/25/20 0001       Neck Exercises: Machines for Strengthening   Lat Pull 35#  2x10 seated    Other Machines for Strengthening 35# vertical row seated 2x10      Neck Exercises: Seated   Other Seated Exercise ER with red band 2x10      Neck Exercises: Supine   Other Supine Exercise on foam roll: decompression x 3 minutes. Red theraband: horizontal abduction and D2 with emphasis on scapular engagement 2x10 each      Neck Exercises: Prone   Other Prone Exercise head off the table with UE Ws 2x10      Neck Exercises: Stretches   Upper Trapezius Stretch 2 reps;20 seconds    Other Neck Stretches cervical rotation: 2: 20 seconds                       PT  Short Term Goals - 10/25/20 1149       PT SHORT TERM GOAL #1   Title Pt to be independent with initial HEP    Status Achieved      PT SHORT TERM GOAL #2   Title The patient will report decrease in headache to every 3-4 days or less    Baseline duration has shortened and now every other to every 3 days    Status Achieved      PT SHORT TERM GOAL #3   Title Improved cervical flexion to 55 degrees, cervical retraction to neutral; rotation to 40 degrees bil    Baseline met for rotation    Status On-going               PT Long Term Goals - 09/22/20 1256       PT LONG TERM GOAL #1   Title The patient will report an overall improvement in headache frequency and lower neck pain by 60%    Time 8    Period Weeks    Status New    Target Date 11/17/20      PT LONG TERM GOAL #2   Title Improved cervical sidebending ROM to 35 degrees and rotation to 45 degrees needed for driving    Time 8    Period Weeks    Status New      PT LONG TERM GOAL #3   Title The patient will be independent in self management and ex strategies to complement his work out routine    Time 8    Period Weeks     Status New      PT LONG TERM GOAL #4   Title FOTO outcome score improved to 70%    Time 8    Period Weeks    Status New                   Plan - 10/25/20 1203     Clinical Impression Statement Patient reports less headache duration and frequency since the start of care and has not been on the computer as much.    Pt was challenged by exercise on foam roll due to core instability.  No pain with this today.  PT provided verbal and tactile curing throughout session to reduce scapular elevation.   Cervical roation is improved into rotation and sidebending.  Pt will benefit from skilled PT to address headaches, neck tension and postural strength.    PT Frequency 2x / week    PT Duration 8 weeks    PT Treatment/Interventions ADLs/Self Care Home Management;Cryotherapy;Electrical Stimulation;Ultrasound;Traction;Moist Heat;Therapeutic activities;Therapeutic exercise;Neuromuscular re-education;Manual techniques;Patient/family education;Dry needling;Taping    PT Next Visit Plan continue with manual therapy upper and lower cervical joint mobility and upper T spine; suboccipital release;   upper cervical flexion mobilization;  hold on DN secondary to negative painful past experience with LBP;    PT Home Exercise Plan [email or web address]    Recommended Other Services initial cert is signed             Patient will benefit from skilled therapeutic intervention in order to improve the following deficits and impairments:  Increased fascial restricitons, Decreased range of motion, Increased muscle spasms, Pain  Visit Diagnosis: Cervicogenic headache  Cervicalgia     Problem List Patient Active Problem List   Diagnosis Date Noted   Previous back surgery 11/24/2019   Hypertension 08/20/2016   Gallstones 02/22/2011   Abdominal pain, right upper quadrant 02/22/2011   Crohn's  02/13/2011   OTITIS MEDIA, SUPPURATIVE 11/09/2009   Dyslipidemia 07/22/2009   ANEMIA, IRON DEFICIENCY 07/22/2009    ASTHMA 07/22/2009   GERD 07/22/2009   PREDIABETES 07/22/2009   NONSPECIFIC ABNORM RESULTS KIDNEY FUNCTION STUDY 07/22/2009    Sigurd Sos, PT 10/25/20 12:31 PM   [facility] [address] [place], [place], [address] Phone: [phone]   Fax:  [phone]  Name: Eric Duran MRN: [id number] Date of Birth: 1955-04-08

## 2020-11-08 ENCOUNTER — Other Ambulatory Visit (HOSPITAL_BASED_OUTPATIENT_CLINIC_OR_DEPARTMENT_OTHER): Payer: Self-pay

## 2020-11-08 ENCOUNTER — Ambulatory Visit: Payer: Medicare Other | Attending: Internal Medicine

## 2020-11-08 DIAGNOSIS — Z23 Encounter for immunization: Secondary | ICD-10-CM

## 2020-11-08 MED ORDER — PFIZER COVID-19 VAC BIVALENT 30 MCG/0.3ML IM SUSP
INTRAMUSCULAR | 0 refills | Status: DC
  Filled 2020-11-08: qty 0.3, 1d supply, fill #0

## 2020-11-08 NOTE — Progress Notes (Signed)
   Covid-19 Vaccination Clinic  Name:  REMBERTO LIENHARD    MRN: [id number] DOB: 11-05-1955  11/08/2020  Mr. Kipp was observed post Covid-19 immunization for 15 minutes without incident. He was provided with Vaccine Information Sheet and instruction to access the V-Safe system.   Mr. Kue was instructed to call 911 with any severe reactions post vaccine: Difficulty breathing  Swelling of face and throat  A fast heartbeat  A bad rash all over body  Dizziness and weakness

## 2020-11-09 ENCOUNTER — Ambulatory Visit: Payer: Medicare Other | Attending: Family Medicine

## 2020-11-09 ENCOUNTER — Other Ambulatory Visit: Payer: Self-pay

## 2020-11-09 DIAGNOSIS — G4486 Cervicogenic headache: Secondary | ICD-10-CM | POA: Diagnosis present

## 2020-11-09 DIAGNOSIS — M542 Cervicalgia: Secondary | ICD-10-CM | POA: Diagnosis present

## 2020-11-09 NOTE — Patient Instructions (Signed)
Melt Method: [email or web address]

## 2020-11-09 NOTE — Therapy (Signed)
[facility] @ [place], [place], [address] Phone: [phone]   Fax:  [phone]  Physical Therapy Treatment  Patient Details  Name: Eric Duran MRN: [id number] Date of Birth: Jun 12, 1955 Referring Provider (PT): Dr. Jaynee Eagles   Encounter Date: 11/09/2020   PT End of Session - 11/09/20 1306     Visit Number 7    Date for PT Re-Evaluation 11/17/20    Authorization Type Medicare/BCBS 10th visit progress note    Progress Note Due on Visit 10    PT Start Time 9211    PT Stop Time 1304    PT Time Calculation (min) 33 min    Activity Tolerance Patient tolerated treatment well    Behavior During Therapy [facility] for tasks assessed/performed             Past Medical History:  Diagnosis Date   ANEMIA, IRON DEFICIENCY 07/22/2009   ASTHMA 07/22/2009   mild   GERD 07/22/2009   HYPERLIPIDEMIA 07/22/2009   Kidney stones 2001   6 times in past   [place], SUPPURATIVE 11/09/2009   PREDIABETES 07/22/2009   pt does not monitor cbg at home    Past Surgical History:  Procedure Laterality Date   BACK SURGERY     lumbar x 2   BIOPSY BOWEL  01/30/1980   exploration bowel surgery, chrons disease   CHOLECYSTECTOMY  02/26/2011   Procedure: LAPAROSCOPIC CHOLECYSTECTOMY WITH INTRAOPERATIVE CHOLANGIOGRAM;  Surgeon: Earnstine Regal, MD;  Location: [facility];  Service: General;  Laterality: N/A;   colonscopy  2012 and 2006   HERNIA REPAIR  2005 - approx   TONSILLECTOMY  1960 - approx    There were no vitals filed for this visit.   Subjective Assessment - 11/09/20 1236     Subjective I have been doing rellay well.  No headaches or neck pain.                               [facility] Adult PT Treatment/Exercise - 11/09/20 0001       Neck Exercises: Machines for Strengthening   Lat Pull 35#  2x10 seated      Neck Exercises: Standing   Other Standing Exercises blue band rows with eyes ahead 20x each side       Neck Exercises: Seated   Other Seated Exercise ER with red band 2x10      Neck Exercises: Supine   Other Supine Exercise MELT method with OPTP foam roll- PT issued information on youtube video for MELT method    Other Supine Exercise on foam roll: decompression x 3 minutes. Red theraband: horizontal abduction and D2 with emphasis on scapular engagement 2x10 each                     PT Education - 11/09/20 1256     Education Details MELT Method    Person(s) Educated Patient    Methods Explanation;Verbal cues    Comprehension Verbalized understanding;Returned demonstration              PT Short Term Goals - 10/25/20 1149       PT SHORT TERM GOAL #1   Title Pt to be independent with initial HEP    Status Achieved      PT SHORT TERM GOAL #2   Title The patient will report decrease in headache to every 3-4 days or  less    Baseline duration has shortened and now every other to every 3 days    Status Achieved      PT SHORT TERM GOAL #3   Title Improved cervical flexion to 55 degrees, cervical retraction to neutral; rotation to 40 degrees bil    Baseline met for rotation    Status On-going               PT Long Term Goals - 11/09/20 1237       PT LONG TERM GOAL #1   Title The patient will report an overall improvement in headache frequency and lower neck pain by 60%    Baseline 30% better    Status On-going      PT LONG TERM GOAL #3   Title The patient will be independent in self management and ex strategies to complement his work out routine    Status On-going                   Plan - 11/09/20 1306     Clinical Impression Statement Patient reports 30% reduction in headache duration and frequency since the start of care and has not been on the computer as much. Pt had a lapse in treatment x 2 weeks and has been working on [employer].  Pt purchased a foam roll and has been doing supine band exercises on the roll.  Pt demonstrated improved core  stability on the roll today.  PT showed pt how to do MELT method with foam roll for tissue mobility of neck and suboccipitals.   Pt will benefit from skilled PT to address headaches, neck tension and postural strength.    PT Frequency 2x / week    PT Duration 8 weeks    PT Treatment/Interventions ADLs/Self Care Home Management;Cryotherapy;Electrical Stimulation;Ultrasound;Traction;Moist Heat;Therapeutic activities;Therapeutic exercise;Neuromuscular re-education;Manual techniques;Patient/family education;Dry needling;Taping    PT Next Visit Plan ERO next session.  Give FOTO, review MELT method, check cervical A/ROM    PT Home Exercise Plan [email or web address]    Consulted and Agree with Plan of Care Patient             Patient will benefit from skilled therapeutic intervention in order to improve the following deficits and impairments:  Increased fascial restricitons, Decreased range of motion, Increased muscle spasms, Pain  Visit Diagnosis: Cervicogenic headache  Cervicalgia     Problem List Patient Active Problem List   Diagnosis Date Noted   Previous back surgery 11/24/2019   Hypertension 08/20/2016   Gallstones 02/22/2011   Abdominal pain, right upper quadrant 02/22/2011   Crohn's 02/13/2011   OTITIS MEDIA, SUPPURATIVE 11/09/2009   Dyslipidemia 07/22/2009   ANEMIA, IRON DEFICIENCY 07/22/2009   ASTHMA 07/22/2009   GERD 07/22/2009   PREDIABETES 07/22/2009   NONSPECIFIC ABNORM RESULTS KIDNEY FUNCTION STUDY 07/22/2009    Sigurd Sos, PT 11/09/20 1:08 PM  [facility] @ [place], [place], [address] Phone: [phone]   Fax:  [phone]  Name: Eric Duran MRN: [id number] Date of Birth: 02-08-1955

## 2020-11-16 ENCOUNTER — Other Ambulatory Visit: Payer: Self-pay

## 2020-11-16 ENCOUNTER — Ambulatory Visit: Payer: Medicare Other

## 2020-11-16 DIAGNOSIS — G4486 Cervicogenic headache: Secondary | ICD-10-CM

## 2020-11-16 DIAGNOSIS — M542 Cervicalgia: Secondary | ICD-10-CM

## 2020-11-16 NOTE — Therapy (Signed)
[facility] @ [place], [place], [address] Phone: [phone]   Fax:  [phone]  Physical Therapy Treatment  Patient Details  Name: Eric Duran MRN: [id number] Date of Birth: 09-18-1955 Referring Provider (PT): Dr. Jaynee Eagles   Encounter Date: 11/16/2020   PT End of Session - 11/16/20 1145     Visit Number 8    Date for PT Re-Evaluation 12/28/20    Authorization Type Medicare/BCBS 10th visit progress note    Progress Note Due on Visit 10    PT Start Time 1103    PT Stop Time 1138    PT Time Calculation (min) 35 min    Activity Tolerance Patient tolerated treatment well    Behavior During Therapy [facility] for tasks assessed/performed             Past Medical History:  Diagnosis Date   ANEMIA, IRON DEFICIENCY 07/22/2009   ASTHMA 07/22/2009   mild   GERD 07/22/2009   HYPERLIPIDEMIA 07/22/2009   Kidney stones 2001   6 times in past   [place], SUPPURATIVE 11/09/2009   PREDIABETES 07/22/2009   pt does not monitor cbg at home    Past Surgical History:  Procedure Laterality Date   BACK SURGERY     lumbar x 2   BIOPSY BOWEL  01/30/1980   exploration bowel surgery, chrons disease   CHOLECYSTECTOMY  02/26/2011   Procedure: LAPAROSCOPIC CHOLECYSTECTOMY WITH INTRAOPERATIVE CHOLANGIOGRAM;  Surgeon: Earnstine Regal, MD;  Location: [facility];  Service: General;  Laterality: N/A;   colonscopy  2012 and 2006   HERNIA REPAIR  2005 - approx   TONSILLECTOMY  1960 - approx    There were no vitals filed for this visit.   Subjective Assessment - 11/16/20 1111     Subjective I had some soreness last week from using a new neck roll that I got on [place].  Better now.    Patient Stated Goals Diminish headaches    Currently in Pain? No/denies                [facility] PT Assessment - 11/16/20 0001       Assessment   Medical Diagnosis cervico-occipital neuralgia    Referring Provider (PT) Dr. Jaynee Eagles       Observation/Other Assessments   Focus on Therapeutic Outcomes (FOTO)  65      AROM   Cervical - Right Rotation 60    Cervical - Left Rotation 75                           [facility] Adult PT Treatment/Exercise - 11/16/20 0001       Neck Exercises: Machines for Strengthening   Lat Pull 35#  2x10 seated      Neck Exercises: Supine   Other Supine Exercise on foam roll: decompression x 3 minutes. Blue theraband: horizontal abduction, ER and D2 with emphasis on scapular engagement 2x10 each      Neck Exercises: Stretches   Upper Trapezius Stretch 2 reps;20 seconds    Other Neck Stretches cervical rotation: 2: 20 seconds                       PT Short Term Goals - 10/25/20 1149       PT SHORT TERM GOAL #1   Title Pt to be independent with initial HEP    Status Achieved  PT SHORT TERM GOAL #2   Title The patient will report decrease in headache to every 3-4 days or less    Baseline duration has shortened and now every other to every 3 days    Status Achieved      PT SHORT TERM GOAL #3   Title Improved cervical flexion to 55 degrees, cervical retraction to neutral; rotation to 40 degrees bil    Baseline met for rotation    Status On-going               PT Long Term Goals - 11/16/20 1111       PT LONG TERM GOAL #1   Title The patient will report an overall improvement in headache frequency and lower neck pain by 80%    Baseline 60%    Time 8    Period Weeks    Status Revised    Target Date 12/28/20      PT LONG TERM GOAL #2   Title Improved cervical sidebending ROM to 35 degrees and rotation to 45 degrees needed for driving    Baseline met for rotation    Time 6    Period Weeks    Status On-going    Target Date 12/28/20      PT LONG TERM GOAL #3   Title The patient will be independent in self management and ex strategies to complement his work out routine    Status Achieved      PT LONG TERM GOAL #4   Title FOTO outcome score  improved to 70    Baseline 65    Time 6    Period Weeks    Status On-going    Target Date 12/28/20                   Plan - 11/16/20 1130     Clinical Impression Statement Patient reports 60% reduction in headache duration and frequency since the start of care and has not been on the computer as much. Pt purchased a foam roll and has been doing supine band exercises on the roll including MELT method for tissue mobilization.   Pt demonstrated improved core stability on the roll today.  PT showed pt how to do MELT method with foam roll for tissue mobility of neck and suboccipitals.  Pt with improved cervical mobility overall although limited in Rt rotation.  Pt will benefit from skilled PT to address headaches, neck tension and postural strength.    Rehab Potential Good    PT Frequency 2x / week    PT Duration 6 weeks    PT Treatment/Interventions ADLs/Self Care Home Management;Cryotherapy;Electrical Stimulation;Ultrasound;Traction;Moist Heat;Therapeutic activities;Therapeutic exercise;Neuromuscular re-education;Manual techniques;Patient/family education;Dry needling;Taping;Functional mobility training    PT Next Visit Plan postural strength, tissue and segmental mobility to improve Rt rotation, measure cervical sidebending    PT Home Exercise Plan [email or web address]    Consulted and Agree with Plan of Care Patient             Patient will benefit from skilled therapeutic intervention in order to improve the following deficits and impairments:  Increased fascial restricitons, Decreased range of motion, Increased muscle spasms, Pain  Visit Diagnosis: Cervicogenic headache - Plan: PT plan of care cert/re-cert  Cervicalgia - Plan: PT plan of care cert/re-cert     Problem List Patient Active Problem List   Diagnosis Date Noted   Previous back surgery 11/24/2019   Hypertension 08/20/2016   Gallstones 02/22/2011   Abdominal pain, right upper  quadrant 02/22/2011   Crohn's 02/13/2011    OTITIS MEDIA, SUPPURATIVE 11/09/2009   Dyslipidemia 07/22/2009   ANEMIA, IRON DEFICIENCY 07/22/2009   ASTHMA 07/22/2009   GERD 07/22/2009   PREDIABETES 07/22/2009   NONSPECIFIC ABNORM RESULTS KIDNEY FUNCTION STUDY 07/22/2009   Sigurd Sos, PT 11/16/20 11:48 AM   [facility] @ [place], [place], [address] Phone: [phone]   Fax:  [phone]  Name: Eric Duran MRN: [id number] Date of Birth: 1955/05/10

## 2020-11-22 ENCOUNTER — Other Ambulatory Visit: Payer: Self-pay

## 2020-11-22 ENCOUNTER — Ambulatory Visit: Payer: Medicare Other

## 2020-11-22 DIAGNOSIS — M542 Cervicalgia: Secondary | ICD-10-CM

## 2020-11-22 DIAGNOSIS — G4486 Cervicogenic headache: Secondary | ICD-10-CM | POA: Diagnosis not present

## 2020-11-22 NOTE — Patient Instructions (Signed)
Access Code: [email or web address] URL: [email or web address] Date: 11/22/2020 Prepared by: Tresa Endo  Seated Figure 4 Piriformis Stretch - 2 x daily - 7 x weekly - 1 sets - 3 reps - 20 hold

## 2020-11-22 NOTE — Therapy (Addendum)
[facility] [address] [place], [place], [address] Phone: [phone]   Fax:  [phone]  Physical Therapy Treatment  Patient Details  Name: Eric Duran MRN: [id number] Date of Birth: [birth year] Referring Provider (PT): Dr. Jaynee Eagles   Encounter Date: 11/22/2020   PT End of Session - 11/22/20 1229     Visit Number 9    Date for PT Re-Evaluation 12/28/20    Authorization Type Medicare/BCBS 10th visit progress note    Progress Note Due on Visit 10    PT Start Time 1147    PT Stop Time 1225    PT Time Calculation (min) 38 min    Activity Tolerance Patient tolerated treatment well    Behavior During Therapy [facility] for tasks assessed/performed             Past Medical History:  Diagnosis Date   ANEMIA, IRON DEFICIENCY 07/22/2009   ASTHMA 07/22/2009   mild   GERD 07/22/2009   HYPERLIPIDEMIA 07/22/2009   Kidney stones 2001   6 times in past   [place], SUPPURATIVE 11/09/2009   PREDIABETES 07/22/2009   pt does not monitor cbg at home    Past Surgical History:  Procedure Laterality Date   BACK SURGERY     lumbar x 2   BIOPSY BOWEL  01/30/1980   exploration bowel surgery, chrons disease   CHOLECYSTECTOMY  02/26/2011   Procedure: LAPAROSCOPIC CHOLECYSTECTOMY WITH INTRAOPERATIVE CHOLANGIOGRAM;  Surgeon: Earnstine Regal, MD;  Location: [facility];  Service: General;  Laterality: N/A;   colonscopy  2012 and 2006   HERNIA REPAIR  2005 - approx   TONSILLECTOMY  1960 - approx    There were no vitals filed for this visit.   Subjective Assessment - 11/22/20 1204     Subjective I had some pain on the Lt side of my neck 2 days ago after lying over a roll and putting pressure on the muscles.  Feeling better now.    Currently in Pain? Yes    Pain Location Neck    Pain Orientation Right;Left    Pain Descriptors / Indicators Aching    Pain Type Chronic pain    Pain Onset More than a month ago    Pain Frequency Intermittent                                [facility] Adult PT Treatment/Exercise - 11/22/20 0001       Neck Exercises: Machines for Strengthening   UBE (Upper Arm Bike) Level 1x 6 minutes (3/3)- PT present to discuss progress    Lat Pull 40#  2x10 seated      Neck Exercises: Standing   Other Standing Exercises 20# rows 2x10      Neck Exercises: Seated   Other Seated Exercise piriformis stretch 3x20 seconds      Manual Therapy   Manual Therapy Soft tissue mobilization;Myofascial release    Manual therapy comments bil suboccipitals and cervical paraspinals with gentle P/ROM and sideglide                     PT Education - 11/22/20 1208     Education Details Access Code: [employer]) Educated Patient    Methods Explanation;Demonstration;Handout    Comprehension Verbalized understanding;Returned demonstration              PT Short Term Goals -  10/25/20 1149       PT SHORT TERM GOAL #1   Title Pt to be independent with initial HEP    Status Achieved      PT SHORT TERM GOAL #2   Title The patient will report decrease in headache to every 3-4 days or less    Baseline duration has shortened and now every other to every 3 days    Status Achieved      PT SHORT TERM GOAL #3   Title Improved cervical flexion to 55 degrees, cervical retraction to neutral; rotation to 40 degrees bil    Baseline met for rotation    Status On-going               PT Long Term Goals - 11/16/20 1111       PT LONG TERM GOAL #1   Title The patient will report an overall improvement in headache frequency and lower neck pain by 80%    Baseline 60%    Time 8    Period Weeks    Status Revised    Target Date 12/28/20      PT LONG TERM GOAL #2   Title Improved cervical sidebending ROM to 35 degrees and rotation to 45 degrees needed for driving    Baseline met for rotation    Time 6    Period Weeks    Status On-going    Target Date 12/28/20      PT LONG TERM  GOAL #3   Title The patient will be independent in self management and ex strategies to complement his work out routine    Status Achieved      PT LONG TERM GOAL #4   Title FOTO outcome score improved to 70    Baseline 65    Time 6    Period Weeks    Status On-going    Target Date 12/28/20                   Plan - 11/22/20 1228     Clinical Impression Statement Patient reports 60% reduction in headache duration and frequency since the start of care.  Pt had some increased Lt sided neck pain and tension over the weekend. Pt purchased a foam roll and has been doing supine band exercises on the roll including MELT method for tissue mobilization.   Pt with tension in the Lt>Rt suboccpitals and paraspinals and demonstrated improved tissue mobility after manual therapy.   Pt will benefit from skilled PT to address headaches, neck tension and postural strength.    PT Treatment/Interventions ADLs/Self Care Home Management;Cryotherapy;Electrical Stimulation;Ultrasound;Traction;Moist Heat;Therapeutic activities;Therapeutic exercise;Neuromuscular re-education;Manual techniques;Patient/family education;Dry needling;Taping;Functional mobility training    PT Next Visit Plan postural strength, tissue and segmental mobility to improve Rt rotation, measure cervical sidebending    PT Home Exercise Plan [email or web address]    Recommended Other Services recert is signed    Consulted and Agree with Plan of Care Patient             Patient will benefit from skilled therapeutic intervention in order to improve the following deficits and impairments:  Increased fascial restricitons, Decreased range of motion, Increased muscle spasms, Pain  Visit Diagnosis: Cervicalgia  Cervicogenic headache     Problem List Patient Active Problem List   Diagnosis Date Noted   Previous back surgery 11/24/2019   Hypertension 08/20/2016   Gallstones 02/22/2011   Abdominal pain, right upper quadrant 02/22/2011    Crohn's 02/13/2011   OTITIS  MEDIA, SUPPURATIVE 11/09/2009   Dyslipidemia 07/22/2009   ANEMIA, IRON DEFICIENCY 07/22/2009   ASTHMA 07/22/2009   GERD 07/22/2009   PREDIABETES 07/22/2009   NONSPECIFIC ABNORM RESULTS KIDNEY FUNCTION STUDY 07/22/2009   Sigurd Sos, PT 11/22/20 12:31 PM  PHYSICAL THERAPY DISCHARGE SUMMARY  Visits from Start of Care: 9  Current functional level related to goals / functional outcomes: See above for most current status.  Pt didn't return to PT.    Remaining deficits: See above.    Education / Equipment: HEP   Patient agrees to discharge. Patient goals were partially met. Patient is being discharged due to being pleased with the current functional level.  [place], [place] 01/04/21 9:58 AM  [facility] @ [address] [place], [place], [address] Phone: [phone]   Fax:  [phone]  Name: Eric Duran MRN: [id number] Date of Birth: Nov 09, 1955

## 2021-02-02 ENCOUNTER — Telehealth: Payer: Self-pay | Admitting: Family Medicine

## 2021-02-02 NOTE — Telephone Encounter (Signed)
Left message for patient to call back and schedule Medicare Annual Wellness Visit (AWV) either virtually or in office. Left  my jabber number [phone]    AWV-I per [place] 08/30/19  please schedule at anytime with [facility] Nurse Health Advisor 1 or 2   This should be a 45 minute visit.  

## 2021-04-24 ENCOUNTER — Telehealth: Payer: Self-pay | Admitting: Family Medicine

## 2021-04-24 NOTE — Telephone Encounter (Signed)
Left message for patient to call back and schedule Medicare Annual Wellness Visit (AWV) either virtually or in office. Left  my jabber number [phone]    AWV-I per [place] 08/30/19  please schedule at anytime with [facility] Nurse Health Advisor 1 or 2   This should be a 45 minute visit.  

## 2021-05-25 ENCOUNTER — Telehealth: Payer: Self-pay | Admitting: Family Medicine

## 2021-05-25 NOTE — Telephone Encounter (Signed)
N/A unable to leave a message for patient to call back and schedule Medicare Annual Wellness Visit (AWV) to be done virtually or by telephone. ? ?No hx of AWV eligible as of 08/30/19 ? ?Please schedule at anytime with Nurse Health Advisor.     ? ?45 Minutes appointment  ? ?Any questions, please call me at [phone]  ?

## 2021-06-05 ENCOUNTER — Other Ambulatory Visit: Payer: Self-pay | Admitting: Family Medicine

## 2021-06-05 ENCOUNTER — Other Ambulatory Visit: Payer: Self-pay | Admitting: Adult Health

## 2021-06-06 ENCOUNTER — Other Ambulatory Visit: Payer: Self-pay | Admitting: Adult Health

## 2021-06-06 ENCOUNTER — Other Ambulatory Visit: Payer: Self-pay | Admitting: Family Medicine

## 2021-06-06 NOTE — Telephone Encounter (Signed)
Patient need to schedule an ov with Dr. Elease Hashimoto  for more refills. ?

## 2021-06-07 ENCOUNTER — Encounter: Payer: Self-pay | Admitting: Family Medicine

## 2021-06-07 NOTE — Telephone Encounter (Signed)
Patient need to schedule an ov for more refills. 

## 2021-06-07 NOTE — Telephone Encounter (Signed)
Rx refilled for 30 days.  

## 2021-06-13 ENCOUNTER — Encounter: Payer: Self-pay | Admitting: Family Medicine

## 2021-06-13 ENCOUNTER — Ambulatory Visit (INDEPENDENT_AMBULATORY_CARE_PROVIDER_SITE_OTHER): Payer: Medicare Other | Admitting: Family Medicine

## 2021-06-13 VITALS — BP 130/80 | HR 53 | Temp 97.4°F | Ht 74.0 in | Wt 225.3 lb

## 2021-06-13 DIAGNOSIS — Z125 Encounter for screening for malignant neoplasm of prostate: Secondary | ICD-10-CM | POA: Diagnosis not present

## 2021-06-13 DIAGNOSIS — R7309 Other abnormal glucose: Secondary | ICD-10-CM | POA: Diagnosis not present

## 2021-06-13 DIAGNOSIS — E785 Hyperlipidemia, unspecified: Secondary | ICD-10-CM | POA: Diagnosis not present

## 2021-06-13 DIAGNOSIS — L720 Epidermal cyst: Secondary | ICD-10-CM

## 2021-06-13 DIAGNOSIS — I10 Essential (primary) hypertension: Secondary | ICD-10-CM | POA: Diagnosis not present

## 2021-06-13 LAB — HEPATIC FUNCTION PANEL
ALT: 31 U/L (ref 0–53)
AST: 22 U/L (ref 0–37)
Albumin: 4.7 g/dL (ref 3.5–5.2)
Alkaline Phosphatase: 55 U/L (ref 39–117)
Bilirubin, Direct: 0.2 mg/dL (ref 0.0–0.3)
Total Bilirubin: 1 mg/dL (ref 0.2–1.2)
Total Protein: 7 g/dL (ref 6.0–8.3)

## 2021-06-13 LAB — BASIC METABOLIC PANEL
BUN: 17 mg/dL (ref 6–23)
CO2: 28 mEq/L (ref 19–32)
Calcium: 9.8 mg/dL (ref 8.4–10.5)
Chloride: 102 mEq/L (ref 96–112)
Creatinine, Ser: 1.32 mg/dL (ref 0.40–1.50)
GFR: 55.71 mL/min — ABNORMAL LOW (ref >60.00)
Glucose, Bld: 116 mg/dL — ABNORMAL HIGH (ref 70–99)
Potassium: 4.9 mEq/L (ref 3.5–5.1)
Sodium: 138 mEq/L (ref 135–145)

## 2021-06-13 LAB — LIPID PANEL
Cholesterol: 196 mg/dL (ref 0–200)
HDL: 65.7 mg/dL (ref >39.00)
LDL Cholesterol: 111 mg/dL — ABNORMAL HIGH (ref 0–99)
NonHDL: 129.89
Total CHOL/HDL Ratio: 3
Triglycerides: 94 mg/dL (ref 0.0–149.0)
VLDL: 18.8 mg/dL (ref 0.0–40.0)

## 2021-06-13 LAB — PSA, MEDICARE: PSA: 0.46 ng/ml (ref 0.10–4.00)

## 2021-06-13 LAB — HEMOGLOBIN A1C: Hgb A1c MFr Bld: 5.7 % (ref 4.6–6.5)

## 2021-06-13 MED ORDER — LOSARTAN POTASSIUM 100 MG PO TABS: 100.0000 mg | ORAL_TABLET | Freq: Every day | ORAL | 3 refills | Status: DC

## 2021-06-13 MED ORDER — PRAVASTATIN SODIUM 40 MG PO TABS: 40.0000 mg | ORAL_TABLET | Freq: Every day | ORAL | 3 refills | Status: DC

## 2021-06-13 NOTE — Progress Notes (Signed)
? ?Established Patient Office Visit ? ?Subjective   ?Patient ID: Eric Duran, male    DOB: [birth year]  Age: 66 y.o. MRN: [id number] ? ?Chief Complaint  ?Patient presents with  ? Medication Refill  ? ? ?HPI ? ? ?Eric Duran is here for routine medical follow-up.  He has hypertension treated with losartan 100 mg daily.  Takes pravastatin 40 mg daily.  He has GERD controlled with over-the-counter Nexium 20 mg daily.  Generally doing well.  Just got back recently from the beach.  Is enjoying retirement.  Occasional headaches.  He has had these in the past which have been evaluated by neurology.  He has multiple cyst on his back but not painful.  History of epidermal cyst. ?Needs refills of medications today and also follow-up labs.  Does also have history of prediabetes.  Not monitoring blood sugars.  No polyuria or polydipsia. ? ?Past Medical History:  ?Diagnosis Date  ? ANEMIA, IRON DEFICIENCY 07/22/2009  ? ASTHMA 07/22/2009  ? mild  ? GERD 07/22/2009  ? HYPERLIPIDEMIA 07/22/2009  ? Kidney stones 2001  ? 6 times in past  ? [place], SUPPURATIVE 11/09/2009  ? PREDIABETES 07/22/2009  ? pt does not monitor cbg at home  ? ?Past Surgical History:  ?Procedure Laterality Date  ? BACK SURGERY    ? lumbar x 2  ? BIOPSY BOWEL  01/30/1980  ? exploration bowel surgery, chrons disease  ? CHOLECYSTECTOMY  02/26/2011  ? Procedure: LAPAROSCOPIC CHOLECYSTECTOMY WITH INTRAOPERATIVE CHOLANGIOGRAM;  Surgeon: Earnstine Regal, MD;  Location: [facility];  Service: General;  Laterality: N/A;  ? colonscopy  2012 and 2006  ? HERNIA REPAIR  2005 - approx  ? TONSILLECTOMY  1960 - approx  ? ? reports that he has never smoked. He has never used smokeless tobacco. He reports current alcohol use. He reports that he does not use drugs. ?family history includes Cancer in his father; Hypertension in his maternal grandfather; Pseudotumor cerebri in his son; Stroke in his maternal grandfather. ?No Known Allergies ? ?Review of Systems  ?Respiratory:  Negative for  shortness of breath.   ?Cardiovascular:  Negative for chest pain.  ?Genitourinary:  Negative for dysuria.  ?Neurological:  Negative for dizziness.  ? ?  ?Objective:  ?  ? ?BP 130/80 (BP Location: Left Arm, Patient Position: Sitting, Cuff Size: Normal)   Pulse (!) 53   Temp (!) 97.4 ?F (36.3 ?C) (Oral)   Ht 6\' 2"  (1.88 m)   Wt 225 lb 4.8 oz (102.2 kg)   SpO2 100%   BMI 28.93 kg/m?  ? ? ?Physical Exam ?Constitutional:   ?   Appearance: He is well-developed.  ?HENT:  ?   Right Ear: External ear normal.  ?   Left Ear: External ear normal.  ?Eyes:  ?   Pupils: Pupils are equal, round, and reactive to light.  ?Neck:  ?   Thyroid: No thyromegaly.  ?Cardiovascular:  ?   Rate and Rhythm: Normal rate and regular rhythm.  ?Pulmonary:  ?   Effort: Pulmonary effort is normal. No respiratory distress.  ?   Breath sounds: Normal breath sounds. No wheezing or rales.  ?Musculoskeletal:  ?   Cervical back: Neck supple.  ?   Right lower leg: No edema.  ?   Left lower leg: No edema.  ?Neurological:  ?   Mental Status: He is alert and oriented to person, place, and time.  ? ? ? ?No results found for any visits on 06/13/21. ? ? ? ?  The 10-year ASCVD risk score (Arnett DK, et al., 2019) is: 17.5% ? ?  ?Assessment & Plan:  ? ?Problem List Items Addressed This Visit   ? ?  ? Unprioritized  ? Hypertension  ? Relevant Medications  ? losartan (COZAAR) 100 MG tablet  ? pravastatin (PRAVACHOL) 40 MG tablet  ? Other Relevant Orders  ? Basic metabolic panel  ? Dyslipidemia - Primary  ? Relevant Medications  ? pravastatin (PRAVACHOL) 40 MG tablet  ? Other Relevant Orders  ? Lipid panel  ? Hepatic function panel  ? PREDIABETES  ? Relevant Orders  ? Hemoglobin A1c  ? ?Other Visit Diagnoses   ? ? Prostate cancer screening      ? Relevant Orders  ? PSA, Medicare  ? Epidermal cyst      ? ?  ?-Hypertension stable and controlled on losartan 100 mg daily.  Refills given for 1 year ?-Check lipid and hepatic panel.  Patient is fasting today.  Refill  pravastatin 40 mg daily for 1 year ?-History of prediabetes.  We will check A1c ?-Discussed prostate cancer and patient conferred with checking PSA. ?-Other health maintenance up-to-date ? ?No follow-ups on file.  ? ? ?Carolann Littler, MD ? ?

## 2021-06-21 ENCOUNTER — Telehealth: Payer: Self-pay | Admitting: Family Medicine

## 2021-06-21 NOTE — Telephone Encounter (Signed)
Left message for patient to call back and schedule Medicare Annual Wellness Visit (AWV) either virtually or in office. Left  my Eric Duran number [phone]   due AWV-I per [place] 08/30/19 ; please schedule at anytime with [facility] Nurse Health Advisor 1 or 2   This should be a 45 minute visit.

## 2021-07-08 ENCOUNTER — Other Ambulatory Visit: Payer: Self-pay | Admitting: Family Medicine

## 2021-07-17 ENCOUNTER — Telehealth: Payer: Self-pay | Admitting: Family Medicine

## 2021-07-17 NOTE — Telephone Encounter (Signed)
Left message for patient to call back and schedule Medicare Annual Wellness Visit (AWV) either virtually or in office. Left  my Eric Duran number [phone]    AWV-I per [place] 08/30/19  please schedule at anytime with [facility] Nurse Health Advisor 1 or 2   This should be a 45 minute visit.

## 2021-09-20 ENCOUNTER — Telehealth: Payer: Self-pay | Admitting: Family Medicine

## 2021-09-20 NOTE — Telephone Encounter (Signed)
Left message for patient to call back and schedule Medicare Annual Wellness Visit (AWV) either virtually or in office. Left  my Zachery Conch number [phone]    AWV-I per [place] 08/30/19  please schedule at anytime with [facility] Nurse Health Advisor 1 or 2   This should be a 45 minute visit.

## 2021-10-11 ENCOUNTER — Telehealth: Payer: Self-pay | Admitting: Family Medicine

## 2021-10-11 NOTE — Telephone Encounter (Signed)
Left message for patient to call back and schedule Medicare Annual Wellness Visit (AWV) either virtually or in office. Left  my jabber number [phone]    AWV-I per [place] 08/30/19  please schedule at anytime with [facility] Nurse Health Advisor 1 or 2   This should be a 45 minute visit.  

## 2021-10-27 ENCOUNTER — Telehealth: Payer: Self-pay | Admitting: Family Medicine

## 2021-10-27 NOTE — Telephone Encounter (Signed)
Left message for patient to call back and schedule Medicare Annual Wellness Visit (AWV) either virtually or in office. Left  my Herbie Drape number [phone]   AWV-I per [place] 08/30/19 please schedule with Nurse Health Adviser   45 min for awv-i and in office appointments 30 min for awv-s  phone/virtual appointments

## 2021-11-16 ENCOUNTER — Telehealth: Payer: Self-pay | Admitting: *Deleted

## 2021-11-16 NOTE — Patient Outreach (Signed)
  Care Coordination   11/16/2021 Name: CONROY GORACKE MRN: [id number] DOB: May 07, 1955   Care Coordination Outreach Attempts:  An unsuccessful telephone outreach was attempted today to offer the patient information about available care coordination services as a benefit of their health plan.   Follow Up Plan:  Additional outreach attempts will be made to offer the patient care coordination information and services.   Encounter Outcome:  No Answer  Care Coordination Interventions Activated:  No   Care Coordination Interventions:  No, not indicated    Raina Mina, RN Care Management Coordinator [place] Office [phone]

## 2021-11-22 ENCOUNTER — Telehealth: Payer: Self-pay | Admitting: *Deleted

## 2021-11-22 NOTE — Patient Outreach (Signed)
  Care Coordination   11/22/2021 Name: Eric Duran MRN: [id number] DOB: [birth year]   Care Coordination Outreach Attempts:  A second unsuccessful outreach was attempted today to offer the patient with information about available care coordination services as a benefit of their health plan.     Follow Up Plan:  Additional outreach attempts will be made to offer the patient care coordination information and services.   Encounter Outcome:  No Answer  Care Coordination Interventions Activated:  No   Care Coordination Interventions:  No, not indicated    Raina Mina, RN Care Management Coordinator [place] Office [phone]

## 2021-12-05 ENCOUNTER — Telehealth: Payer: Self-pay

## 2021-12-05 NOTE — Telephone Encounter (Signed)
Patient wife called and is a patient already and she has asked if you would be willing to take on Eric Duran as a new patient.    Call back at : [phone]

## 2021-12-06 NOTE — Telephone Encounter (Signed)
Appears his spouse if followed by Dr. Beverely Low.  I will agree to take the patient on as his PCP unless they would like to check with Dr. Beverely Low first.

## 2021-12-07 NOTE — Telephone Encounter (Signed)
Called and scheduled for December 18th new patient

## 2022-01-15 ENCOUNTER — Encounter: Payer: Self-pay | Admitting: Family Medicine

## 2022-01-15 ENCOUNTER — Ambulatory Visit (INDEPENDENT_AMBULATORY_CARE_PROVIDER_SITE_OTHER): Payer: Medicare Other | Admitting: Family Medicine

## 2022-01-15 VITALS — BP 136/70 | HR 59 | Temp 97.9°F | Ht 74.0 in | Wt 227.8 lb

## 2022-01-15 DIAGNOSIS — K219 Gastro-esophageal reflux disease without esophagitis: Secondary | ICD-10-CM

## 2022-01-15 DIAGNOSIS — R7309 Other abnormal glucose: Secondary | ICD-10-CM

## 2022-01-15 DIAGNOSIS — E785 Hyperlipidemia, unspecified: Secondary | ICD-10-CM | POA: Diagnosis not present

## 2022-01-15 DIAGNOSIS — L720 Epidermal cyst: Secondary | ICD-10-CM

## 2022-01-15 DIAGNOSIS — I10 Essential (primary) hypertension: Secondary | ICD-10-CM | POA: Diagnosis not present

## 2022-01-15 LAB — LIPID PANEL
Cholesterol: 173 mg/dL (ref 0–200)
HDL: 69.8 mg/dL (ref >39.00)
LDL Cholesterol: 94 mg/dL (ref 0–99)
NonHDL: 103.53
Total CHOL/HDL Ratio: 2
Triglycerides: 48 mg/dL (ref 0.0–149.0)
VLDL: 9.6 mg/dL (ref 0.0–40.0)

## 2022-01-15 LAB — COMPREHENSIVE METABOLIC PANEL
ALT: 24 U/L (ref 0–53)
AST: 19 U/L (ref 0–37)
Albumin: 4.7 g/dL (ref 3.5–5.2)
Alkaline Phosphatase: 51 U/L (ref 39–117)
BUN: 16 mg/dL (ref 6–23)
CO2: 29 mEq/L (ref 19–32)
Calcium: 9.7 mg/dL (ref 8.4–10.5)
Chloride: 98 mEq/L (ref 96–112)
Creatinine, Ser: 1.3 mg/dL (ref 0.40–1.50)
GFR: 56.5 mL/min — ABNORMAL LOW (ref >60.00)
Glucose, Bld: 98 mg/dL (ref 70–99)
Potassium: 4.4 mEq/L (ref 3.5–5.1)
Sodium: 135 mEq/L (ref 135–145)
Total Bilirubin: 0.9 mg/dL (ref 0.2–1.2)
Total Protein: 7 g/dL (ref 6.0–8.3)

## 2022-01-15 LAB — HEMOGLOBIN A1C: Hgb A1c MFr Bld: 5.7 % (ref 4.6–6.5)

## 2022-01-15 NOTE — Progress Notes (Unsigned)
Subjective:  Patient ID: Eric Duran, male    DOB: 17-Apr-1955  Age: 66 y.o. MRN: [id number]  CC:  Chief Complaint  Patient presents with   Establish Care    Pt has a couple of questions about his LDL because its high,    Cyst    Pt has a cyst on his back and wants to know if he needs a referral   Diabetes    Pt has been prediabetic for a while and wants to know how to get out of that range     HPI [name] presents for   New patient to establish care, with concerns as above.  Previous PCP Dr. Caryl Never -last note reviewed.  Spouse is followed by my colleague Dr. Beverely Low.  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 Readings, exercise, time with grandkids.   GERD Treated with over-the-counter Nexium 20 mg daily.  Well controlled. Certain foods will have minor flare, not significant. Magnesium and vit D supplement daily.   Prediabetes: Borderline with A1c 5.7 in May.  Weight up 2 pounds from that visit.  Not treated with meds. No polyuria, polydipsia, or blurry vision.  Initially exercised with running after retirement. 20# weight gain.  Treadmill 5 days per week and weights - 1 hour per day.  Sugar beverages - alcohol 2-3 beers, occasional wine  Usually cooks at home.  Portion sizes may be issue.  Body mass index is 29.25 kg/m.  Lab Results  Component Value Date   HGBA1C 5.7 06/13/2021   Wt Readings from Last 3 Encounters:  01/15/22 227 lb 12.8 oz (103.3 kg)  06/13/21 225 lb 4.8 oz (102.2 kg)  08/10/20 228 lb (103.4 kg)    Hyperlipidemia: Pravastatin 40 mg daily, no new myalgias/side effects.  Last ate last night. Small snack this am.  Lab Results  Component Value Date   CHOL 196 06/13/2021   HDL 65.70 06/13/2021   LDLCALC 111 (H) 06/13/2021   LDLDIRECT 173.8 05/05/2012   TRIG 94.0 06/13/2021   CHOLHDL 3 06/13/2021   Lab Results  Component Value Date   ALT 31 06/13/2021   AST 22 06/13/2021   ALKPHOS 55 06/13/2021   BILITOT 1.0 06/13/2021    Hypertension: Losartan 100 mg daily.no new side effects.  Home readings: 120-130/70.  BP Readings from Last 3 Encounters:  01/15/22 136/70  06/13/21 130/80  08/10/20 (!) 157/92   Lab Results  Component Value Date   CREATININE 1.32 06/13/2021   Cyst on back: History of epidermal cysts and multiple cyst on back but not painful at his May visit with Dr. Caryl Never.  One area in mid back is sore/annoyance to lie down or with lifting weights. Getting bigger. No d/c, no fever, no new redness.  No derm currently.   Declines covid booster.   History Patient Active Problem List   Diagnosis Date Noted   Previous back surgery 11/24/2019   Hypertension 08/20/2016   Gallstones 02/22/2011   Abdominal pain, right upper quadrant 02/22/2011   Crohn's 02/13/2011   OTITIS MEDIA, SUPPURATIVE 11/09/2009   Dyslipidemia 07/22/2009   ANEMIA, IRON DEFICIENCY 07/22/2009   ASTHMA 07/22/2009   GERD 07/22/2009   PREDIABETES 07/22/2009   NONSPECIFIC ABNORM RESULTS KIDNEY FUNCTION STUDY 07/22/2009   Past Medical History:  Diagnosis Date   ANEMIA, IRON DEFICIENCY 07/22/2009   ASTHMA 07/22/2009   mild   GERD 07/22/2009   HYPERLIPIDEMIA 07/22/2009   Kidney stones 2001   6 times in past  OTITIS MEDIA, SUPPURATIVE 11/09/2009   PREDIABETES 07/22/2009   pt does not monitor cbg at home   Past Surgical History:  Procedure Laterality Date   BACK SURGERY     lumbar x 2   BIOPSY BOWEL  01/30/1980   exploration bowel surgery, chrons disease   CHOLECYSTECTOMY  02/26/2011   Procedure: LAPAROSCOPIC CHOLECYSTECTOMY WITH INTRAOPERATIVE CHOLANGIOGRAM;  Surgeon: Velora Heckler, MD;  Location: [facility];  Service: General;  Laterality: N/A;   colonscopy  2012 and 2006   HERNIA REPAIR  2005 - approx   TONSILLECTOMY  1960 - approx   No Known Allergies Prior to Admission medications   Medication Sig Start Date End Date Taking? Authorizing Provider  esomeprazole (NEXIUM) 20 MG capsule Take 20 mg by mouth daily at  12 noon.   Yes [provider]  losartan (COZAAR) 100 MG tablet Take 1 tablet (100 mg total) by mouth daily. 06/13/21  Yes Burchette, Elberta Fortis, MD  pravastatin (PRAVACHOL) 40 MG tablet Take 1 tablet (40 mg total) by mouth daily. 06/13/21  Yes Burchette, Elberta Fortis, MD  COVID-19 mRNA bivalent vaccine, Pfizer, (PFIZER COVID-19 VAC BIVALENT) injection Inject into the muscle. 11/08/20   Judyann Munson, MD  losartan (COZAAR) 25 MG tablet Losartan    [provider]   Social History   Socioeconomic History   Marital status: Married    Spouse name: Not on file   Number of children: Not on file   Years of education: Not on file   Highest education level: Not on file  Occupational History   Not on file  Tobacco Use   Smoking status: Never   Smokeless tobacco: Never  Vaping Use   Vaping Use: Never used  Substance and Sexual Activity   Alcohol use: Yes    Comment: occasional glass of wine   Drug use: No   Sexual activity: Not on file  Other Topics Concern   Not on file  Social History Narrative   Lives at home with wife    Right handed   Caffeine: 2 cups/day   Social Determinants of Health   Financial Resource Strain: Unknown (06/09/2021)   Overall Financial Resource Strain (CARDIA)    Difficulty of Paying Living Expenses: Patient refused  Food Insecurity: Unknown (06/09/2021)   Hunger Vital Sign    Worried About Running Out of Food in the Last Year: Patient refused    Ran Out of Food in the Last Year: Patient refused  Transportation Needs: Unknown (06/09/2021)   PRAPARE - Transportation    Lack of Transportation (Medical): Patient refused    Lack of Transportation (Non-Medical): Patient refused  Physical Activity: Unknown (06/09/2021)   Exercise Vital Sign    Days of Exercise per Week: Patient refused    Minutes of Exercise per Session: Not on file  Stress: Unknown (06/09/2021)   [employer] of Occupational Health - Occupational Stress Questionnaire    Feeling  of Stress : Patient refused  Social Connections: Unknown (06/09/2021)   Social Connection and Isolation Panel [NHANES]    Frequency of Communication with Friends and Family: Patient refused    Frequency of Social Gatherings with Friends and Family: Patient refused    Attends Religious Services: Patient refused    Database administrator or Organizations: Patient refused    Attends Banker Meetings: Not on file    Marital Status: Patient refused  Intimate Partner Violence: Not on file    Review of Systems  Constitutional:  Negative  for fatigue and unexpected weight change.  Eyes:  Negative for visual disturbance.  Respiratory:  Negative for cough, chest tightness and shortness of breath.   Cardiovascular:  Negative for chest pain, palpitations and leg swelling.  Gastrointestinal:  Negative for abdominal pain and blood in stool.  Neurological:  Positive for headaches (episodic, cervical, stable.). Negative for dizziness and light-headedness.     Objective:   Vitals:   01/15/22 1120  BP: 136/70  Pulse: (!) 59  Temp: 97.9 F (36.6 C)  SpO2: 98%  Weight: 227 lb 12.8 oz (103.3 kg)  Height: 6\' 2"  (1.88 m)     Physical Exam Vitals reviewed.  Constitutional:      Appearance: He is well-developed.  HENT:     Head: Normocephalic and atraumatic.  Neck:     Vascular: No carotid bruit or JVD.  Cardiovascular:     Rate and Rhythm: Normal rate and regular rhythm.     Heart sounds: Normal heart sounds. No murmur heard. Pulmonary:     Effort: Pulmonary effort is normal.     Breath sounds: Normal breath sounds. No rales.  Musculoskeletal:     Right lower leg: No edema.     Left lower leg: No edema.  Skin:    General: Skin is warm and dry.     Comments: Multiple cystic structures of back including 1 over mid thoracic spine, 1 upper right, 1 lower left back.  No significant surrounding erythema, no discharge, area of mid back without apparent fluctuance, but slightly  indurated superiorly.  See photo  Neurological:     Mental Status: He is alert and oriented to person, place, and time.  Psychiatric:        Mood and Affect: Mood normal.         Assessment & Plan:  SHLOMA ROGGENKAMP is a 66 y.o. male . Dyslipidemia - Plan: Lipid panel, Comprehensive metabolic panel  Primary hypertension  Other abnormal glucose - Plan: Hemoglobin A1c  Gastroesophageal reflux disease, unspecified whether esophagitis present  Epidermoid cyst of skin of back - Plan: Ambulatory referral to Dermatology   No orders of the defined types were placed in this encounter.  Patient Instructions  Watch snacking and portion sizes as we discussed. Let me know if other questions.  I will refer you to dermatology to discuss the skin cysts and possible removal. Be seen sooner if redness, discharge or other changes.  No med changes today, I will check some labs today. Take care!    Signed,   71, MD [facility], [facility] 01/15/22 12:20 PM

## 2022-01-15 NOTE — Patient Instructions (Addendum)
Watch snacking and portion sizes as we discussed. Let me know if other questions.  I will refer you to dermatology to discuss the skin cysts and possible removal. Be seen sooner if redness, discharge or other changes.  No med changes today, I will check some labs today. Take care!

## 2022-01-17 ENCOUNTER — Encounter: Payer: Self-pay | Admitting: Family Medicine

## 2022-02-07 ENCOUNTER — Ambulatory Visit (INDEPENDENT_AMBULATORY_CARE_PROVIDER_SITE_OTHER): Payer: Medicare Other | Admitting: Family Medicine

## 2022-02-07 ENCOUNTER — Encounter: Payer: Self-pay | Admitting: Family Medicine

## 2022-02-07 VITALS — BP 132/76 | HR 63 | Temp 97.6°F | Ht 74.0 in | Wt 227.6 lb

## 2022-02-07 DIAGNOSIS — L723 Sebaceous cyst: Secondary | ICD-10-CM

## 2022-02-07 DIAGNOSIS — L089 Local infection of the skin and subcutaneous tissue, unspecified: Secondary | ICD-10-CM

## 2022-02-07 MED ORDER — DOXYCYCLINE HYCLATE 100 MG PO TABS: 100.0000 mg | ORAL_TABLET | Freq: Two times a day (BID) | ORAL | 0 refills | Status: DC

## 2022-02-07 NOTE — Patient Instructions (Signed)
I do think that any sebaceous or epidermal cyst on the right back is infected.  Likely that we will need opening and drainage, and I have referred you to the surgery group to hopefully be seen either today or tomorrow.  If you are seen today, okay to wait on starting antibiotic until they have evaluated you.  If they are not able to see you until tomorrow, start the antibiotic now.  Okay to apply warm compresses, gentle pressure to express pus if that does start to drain.  Tylenol as needed for pain but let me know if you need something stronger.  Good luck.

## 2022-02-07 NOTE — Progress Notes (Signed)
Subjective:  Patient ID: Eric Duran, male    DOB: 07/10/1955  Age: 67 y.o. MRN: [id number]  CC:  Chief Complaint  Patient presents with   Cyst    Pt notes Cyst of Rt shoulder blade, had appt in December was referred to Derm no answer when he calls, notes this is now inflamed and it is painful unsure of drainage status doesn't think it is at this time     HPI Eric Duran presents for   Cyst of shoulder/back.  History of epidermal cyst, multiple cyst on back, did have slight area of soreness in 1 area of mid back that seem to be getting bigger at his December appointment. No apparent sign of infection or abscess at that time.  Referred to dermatology. Has been trouble getting in touch with dermatology.  Area of concern today -  sore bump on R upper back, past week, no d/c, but sore and inflamed/increased size.  No fever.  Tx: ice once.   History Patient Active Problem List   Diagnosis Date Noted   Previous back surgery 11/24/2019   Hypertension 08/20/2016   Gallstones 02/22/2011   Abdominal pain, right upper quadrant 02/22/2011   Crohn's 02/13/2011   OTITIS MEDIA, SUPPURATIVE 11/09/2009   Dyslipidemia 07/22/2009   ANEMIA, IRON DEFICIENCY 07/22/2009   ASTHMA 07/22/2009   GERD 07/22/2009   PREDIABETES 07/22/2009   NONSPECIFIC ABNORM RESULTS KIDNEY FUNCTION STUDY 07/22/2009   Past Medical History:  Diagnosis Date   ANEMIA, IRON DEFICIENCY 07/22/2009   ASTHMA 07/22/2009   mild   GERD 07/22/2009   HYPERLIPIDEMIA 07/22/2009   Kidney stones 2001   6 times in past   [place], SUPPURATIVE 11/09/2009   PREDIABETES 07/22/2009   pt does not monitor cbg at home   Past Surgical History:  Procedure Laterality Date   BACK SURGERY     lumbar x 2   BIOPSY BOWEL  01/30/1980   exploration bowel surgery, chrons disease   CHOLECYSTECTOMY  02/26/2011   Procedure: LAPAROSCOPIC CHOLECYSTECTOMY WITH INTRAOPERATIVE CHOLANGIOGRAM;  Surgeon: Earnstine Regal, MD;  Location: [facility];   Service: General;  Laterality: N/A;   colonscopy  2012 and 2006   HERNIA REPAIR  2005 - approx   TONSILLECTOMY  1960 - approx   No Known Allergies Prior to Admission medications   Medication Sig Start Date End Date Taking? Authorizing Provider  esomeprazole (NEXIUM) 20 MG capsule Take 20 mg by mouth daily at 12 noon.   Yes [provider]  losartan (COZAAR) 100 MG tablet Take 1 tablet (100 mg total) by mouth daily. 06/13/21  Yes Burchette, Alinda Sierras, MD  pravastatin (PRAVACHOL) 40 MG tablet Take 1 tablet (40 mg total) by mouth daily. 06/13/21  Yes Burchette, Alinda Sierras, MD   Social History   Socioeconomic History   Marital status: Married    Spouse name: Not on file   Number of children: Not on file   Years of education: Not on file   Highest education level: Not on file  Occupational History   Not on file  Tobacco Use   Smoking status: Never   Smokeless tobacco: Never  Vaping Use   Vaping Use: Never used  Substance and Sexual Activity   Alcohol use: Yes    Comment: occasional glass of wine   Drug use: No   Sexual activity: Not on file  Other Topics Concern   Not on file  Social History Narrative   Lives at home with  wife    Right handed   Caffeine: 2 cups/day   Social Determinants of Health   Financial Resource Strain: Unknown (06/09/2021)   Overall Financial Resource Strain (CARDIA)    Difficulty of Paying Living Expenses: Patient refused  Food Insecurity: Unknown (06/09/2021)   Hunger Vital Sign    Worried About Running Out of Food in the Last Year: Patient refused    [place] in the Last Year: Patient refused  Transportation Needs: Unknown (06/09/2021)   [place] - Transportation    Lack of Transportation (Medical): Patient refused    Lack of Transportation (Non-Medical): Patient refused  Physical Activity: Unknown (06/09/2021)   Exercise Vital Sign    Days of Exercise per Week: Patient refused    Minutes of Exercise per Session: Not on file  Stress:  Unknown (06/09/2021)   [place] of Stress : Patient refused  Social Connections: Unknown (06/09/2021)   Social Connection and Isolation Panel [NHANES]    Frequency of Communication with Friends and Family: Patient refused    Frequency of Social Gatherings with Friends and Family: Patient refused    Attends Religious Services: Patient refused    Marine scientist or Organizations: Patient refused    Attends Archivist Meetings: Not on file    Marital Status: Patient refused  Intimate Partner Violence: Not on file    Review of Systems Per hPI.   Objective:   Vitals:   02/07/22 1101  BP: 132/76  Pulse: 63  Temp: 97.6 F (36.4 C)  TempSrc: Temporal  SpO2: 97%  Weight: 227 lb 9.6 oz (103.2 kg)  Height: 6\' 2"  (1.88 m)     Physical Exam Constitutional:      General: He is not in acute distress.    Appearance: Normal appearance. He is well-developed.  HENT:     Head: Normocephalic and atraumatic.  Cardiovascular:     Rate and Rhythm: Normal rate.  Pulmonary:     Effort: Pulmonary effort is normal.  Skin:    Comments: Right upper back, cystic structure with overlying erythema, warmth.  3.5 cm x 3 cm of erythema, approximately 4 cm of induration with central fluctuance.  No discharge.  No vascular streaks.  Tender to palpation.  Other not erythematous cystic structures noted on back.  See photo  Neurological:     Mental Status: He is alert and oriented to person, place, and time.  Psychiatric:        Mood and Affect: Mood normal.         Assessment & Plan:  Eric Duran is a 67 y.o. male . Infected sebaceous cyst - Plan: doxycycline (VIBRA-TABS) 100 MG tablet Suspected infected sebaceous or epidermal cyst.  Likely will need aspiration, unsure of the depth of cyst/wound.  Asked that he be seen by general surgery either today or tomorrow in their acute clinic for this procedure.   Start doxycycline if he is not able to be seen today.  RTC/ER precautions given.  Meds ordered this encounter  Medications   doxycycline (VIBRA-TABS) 100 MG tablet    Sig: Take 1 tablet (100 mg total) by mouth 2 (two) times daily.    Dispense:  20 tablet    Refill:  0   Patient Instructions  I do think that any sebaceous or epidermal cyst on the right back is infected.  Likely that we will need opening and drainage, and I  have referred you to the surgery group to hopefully be seen either today or tomorrow.  If you are seen today, okay to wait on starting antibiotic until they have evaluated you.  If they are not able to see you until tomorrow, start the antibiotic now.  Okay to apply warm compresses, gentle pressure to express pus if that does start to drain.  Tylenol as needed for pain but let me know if you need something stronger.  Good luck.      Signed,   Meredith Staggers, MD [facility], [facility] 02/07/22 12:01 PM

## 2022-04-01 ENCOUNTER — Encounter: Payer: Self-pay | Admitting: Family Medicine

## 2022-04-02 NOTE — Telephone Encounter (Signed)
Pt inquiring about his study and if you can approve his continuation. States you should have received some reports from them about labs and other studies, did you receive these?

## 2022-04-03 NOTE — Telephone Encounter (Signed)
I have not seen EKG or blood work reports, please request.  Will reply to patient as well.  Thanks.

## 2022-04-11 ENCOUNTER — Encounter: Payer: Self-pay | Admitting: Family Medicine

## 2022-04-11 ENCOUNTER — Ambulatory Visit (INDEPENDENT_AMBULATORY_CARE_PROVIDER_SITE_OTHER): Payer: Medicare Other | Admitting: Family Medicine

## 2022-04-11 VITALS — BP 122/64 | HR 68 | Temp 98.0°F | Ht 74.0 in | Wt 212.4 lb

## 2022-04-11 DIAGNOSIS — E875 Hyperkalemia: Secondary | ICD-10-CM

## 2022-04-11 DIAGNOSIS — R9431 Abnormal electrocardiogram [ECG] [EKG]: Secondary | ICD-10-CM | POA: Diagnosis not present

## 2022-04-11 DIAGNOSIS — R001 Bradycardia, unspecified: Secondary | ICD-10-CM | POA: Diagnosis not present

## 2022-04-11 LAB — BASIC METABOLIC PANEL
BUN: 15 mg/dL (ref 6–23)
CO2: 27 mEq/L (ref 19–32)
Calcium: 9.9 mg/dL (ref 8.4–10.5)
Chloride: 95 mEq/L — ABNORMAL LOW (ref 96–112)
Creatinine, Ser: 1.28 mg/dL (ref 0.40–1.50)
GFR: 57.47 mL/min — ABNORMAL LOW (ref >60.00)
Glucose, Bld: 92 mg/dL (ref 70–99)
Potassium: 4.9 mEq/L (ref 3.5–5.1)
Sodium: 132 mEq/L — ABNORMAL LOW (ref 135–145)

## 2022-04-11 NOTE — Telephone Encounter (Signed)
Did you receive an email?

## 2022-04-11 NOTE — Progress Notes (Signed)
Subjective:  Patient ID: Eric Duran, male    DOB: 1955-05-10  Age: 66 y.o. MRN: [id number]  CC:  Chief Complaint  Patient presents with   Labs Only    Pt here for repeat Potassium and an EKG per message a few days ago      HPI Eric Duran presents for   Abnormal EKG has undergone testing for clinical study at [facility].  Noted to have possible first-degree block.  Notes were reviewed, did note a slight prolonged PR interval on 1 note, but the actual EKG had normal PR interval.  Most recent EKG in 2018 with sinus rhythm, PACs, but no acute changes.  PR interval at that time 142. He is not on beta-blocker, only on losartan for hypertension at this time. Denies palpitations, lightheadedness or dizziness.  No orthostatic symptoms. Exercises regularly - no symptoms.   Hyperkalemia Slight hyperkalemia noted on outside labs, borderline.  Plan for repeat today.  Previous potassium has been normal, and is not taking any potassium supplements.  He is on losartan for hypertension as above.      Lab Results  Component Value Date   NA 135 01/15/2022   K 4.4 01/15/2022   CL 98 01/15/2022   CO2 29 01/15/2022      History Patient Active Problem List   Diagnosis Date Noted   Previous back surgery 11/24/2019   Hypertension 08/20/2016   Gallstones 02/22/2011   Abdominal pain, right upper quadrant 02/22/2011   Crohn's 02/13/2011   OTITIS MEDIA, SUPPURATIVE 11/09/2009   Dyslipidemia 07/22/2009   ANEMIA, IRON DEFICIENCY 07/22/2009   ASTHMA 07/22/2009   GERD 07/22/2009   PREDIABETES 07/22/2009   NONSPECIFIC ABNORM RESULTS KIDNEY FUNCTION STUDY 07/22/2009   Past Medical History:  Diagnosis Date   ANEMIA, IRON DEFICIENCY 07/22/2009   ASTHMA 07/22/2009   mild   GERD 07/22/2009   HYPERLIPIDEMIA 07/22/2009   Kidney stones 2001   6 times in past   [place], SUPPURATIVE 11/09/2009   PREDIABETES 07/22/2009   pt does not monitor cbg at home   Past Surgical  History:  Procedure Laterality Date   BACK SURGERY     lumbar x 2   BIOPSY BOWEL  01/30/1980   exploration bowel surgery, chrons disease   CHOLECYSTECTOMY  02/26/2011   Procedure: LAPAROSCOPIC CHOLECYSTECTOMY WITH INTRAOPERATIVE CHOLANGIOGRAM;  Surgeon: Earnstine Regal, MD;  Location: [facility];  Service: General;  Laterality: N/A;   colonscopy  2012 and 2006   HERNIA REPAIR  2005 - approx   TONSILLECTOMY  1960 - approx   No Known Allergies Prior to Admission medications   Medication Sig Start Date End Date Taking? Authorizing Provider  doxycycline (VIBRA-TABS) 100 MG tablet Take 1 tablet (100 mg total) by mouth 2 (two) times daily. 02/07/22   Wendie Agreste, MD  esomeprazole (NEXIUM) 20 MG capsule Take 20 mg by mouth daily at 12 noon.    [provider]  losartan (COZAAR) 100 MG tablet Take 1 tablet (100 mg total) by mouth daily. 06/13/21   Burchette, Alinda Sierras, MD  pravastatin (PRAVACHOL) 40 MG tablet Take 1 tablet (40 mg total) by mouth daily. 06/13/21   Burchette, Alinda Sierras, MD   Social History   Socioeconomic History   Marital status: Married    Spouse name: Not on file   Number of children: Not on file   Years of education: Not on file   Highest education level: Not on file  Occupational History   Not on file  Tobacco Use   Smoking status: Never   Smokeless tobacco: Never  Vaping Use   Vaping Use: Never used  Substance and Sexual Activity   Alcohol use: Yes    Comment: occasional glass of wine   Drug use: No   Sexual activity: Not on file  Other Topics Concern   Not on file  Social History Narrative   Lives at home with wife    Right handed   Caffeine: 2 cups/day   Social Determinants of Health   Financial Resource Strain: Unknown (06/09/2021)   Overall Financial Resource Strain (CARDIA)    Difficulty of Paying Living Expenses: Patient refused  Food Insecurity: Unknown (06/09/2021)   Hunger Vital Sign    Worried About Running Out of Food in the Last Year:  Patient refused    [place] in the Last Year: Patient refused  Transportation Needs: Unknown (06/09/2021)   [place] - Transportation    Lack of Transportation (Medical): Patient refused    Lack of Transportation (Non-Medical): Patient refused  Physical Activity: Unknown (06/09/2021)   Exercise Vital Sign    Days of Exercise per Week: Patient refused    Minutes of Exercise per Session: Not on file  Stress: Unknown (06/09/2021)   [place] of Stress : Patient refused  Social Connections: Unknown (06/09/2021)   Social Connection and Isolation Panel [NHANES]    Frequency of Communication with Friends and Family: Patient refused    Frequency of Social Gatherings with Friends and Family: Patient refused    Attends Religious Services: Patient refused    Active Member of Clubs or Organizations: Patient refused    Attends Archivist Meetings: Not on file    Marital Status: Patient refused  Intimate Partner Violence: Not on file    Review of Systems Per HPI.   Objective:   Vitals:   04/11/22 1035  BP: 122/64  Pulse: 68  Temp: 98 F (36.7 C)  TempSrc: Temporal  SpO2: 100%  Weight: 212 lb 6.4 oz (96.3 kg)  Height: '6\' 2"'$  (1.88 m)     Physical Exam Vitals reviewed.  Constitutional:      Appearance: He is well-developed.  HENT:     Head: Normocephalic and atraumatic.  Neck:     Vascular: No carotid bruit or JVD.  Cardiovascular:     Rate and Rhythm: Normal rate and regular rhythm.     Heart sounds: Normal heart sounds. No murmur heard. Pulmonary:     Effort: Pulmonary effort is normal.     Breath sounds: Normal breath sounds. No rales.  Musculoskeletal:     Right lower leg: No edema.     Left lower leg: No edema.  Skin:    General: Skin is warm and dry.  Neurological:     Mental Status: He is alert and oriented to person, place, and time.  Psychiatric:        Mood and Affect:  Mood normal.        EKG, sinus bradycardia with rate of 54, PR interval 186m.  Right axis.  No acute findings.Compared  to EKG on 08/13/2016, no significant changes other than no further PACs identified on EKG today.  Assessment & Plan:  Eric CLOUGHis a 67y.o. male . Hyperkalemia - Plan: Basic metabolic panel  -Borderline on outside labs, repeat labs in office.  Previously has been normal  in December.  No med changes for now.  Abnormal EKG - Plan: EKG 12-Lead Sinus bradycardia - Plan: EKG 12-Lead  -Reported first-degree block on outside testing, PR interval at the high normal range but not true first-degree block at this time.  No acute findings on EKG, and asymptomatic.  Sinus bradycardia but asymptomatic and he does exercise regularly.  No acute concerns at this time.  Keep follow-up as scheduled.  No orders of the defined types were placed in this encounter.  Patient Instructions  EKG in the office looks okay.  I do not see a first-degree block today.  I will let you know if there are any concerns on repeat blood work today.  I suspect that we will look okay.  No med changes for now.  Let me know if there are questions.     Signed,   Merri Ray, MD [place] 04/11/22 11:20 AM

## 2022-04-11 NOTE — Patient Instructions (Signed)
EKG in the office looks okay.  I do not see a first-degree block today.  I will let you know if there are any concerns on repeat blood work today.  I suspect that we will look okay.  No med changes for now.  Let me know if there are questions.

## 2022-04-25 ENCOUNTER — Telehealth: Payer: Self-pay | Admitting: Family Medicine

## 2022-04-25 NOTE — Telephone Encounter (Signed)
Contacted Eric Duran to schedule their annual wellness visit. Appointment made for 05/09/2022.  Thank you,  [place] Direct dial  [phone]

## 2022-04-25 NOTE — Telephone Encounter (Signed)
Called patient to schedule Medicare Annual Wellness Visit (AWV). Left message for patient to call back and schedule Medicare Annual Wellness Visit (AWV).  Last date of AWV: AWVI eligible as of 08/30/2019  Please schedule an AWVI appointment at any time with [facility].  If any questions, please contact me at [phone].   Thank you,  [place] Direct dial  [phone]

## 2022-05-09 ENCOUNTER — Ambulatory Visit (INDEPENDENT_AMBULATORY_CARE_PROVIDER_SITE_OTHER): Payer: Medicare Other | Admitting: *Deleted

## 2022-05-09 DIAGNOSIS — Z0000 Encounter for general adult medical examination without abnormal findings: Secondary | ICD-10-CM

## 2022-05-09 NOTE — Progress Notes (Signed)
Subjective:   Eric Duran is a 67 y.o. male who presents for an Initial Medicare Annual Wellness Visit.  I connected with  HILMAR BUCKLER on 05/09/22 by a telephone enabled telemedicine application and verified that I am speaking with the correct person using two identifiers.   I discussed the limitations of evaluation and management by telemedicine. The patient expressed understanding and agreed to proceed.  Patient location: home  Provider location: telephone/home     Review of Systems     Cardiac Risk Factors include: advanced age (>88men, >48 women);male gender     Objective:    Today's Vitals   There is no height or weight on file to calculate BMI.     05/09/2022   11:53 AM 09/22/2020   11:48 AM 12/29/2019    1:00 PM 02/26/2011    4:00 PM 02/26/2011   10:54 AM 02/23/2011    1:30 PM  Advanced Directives  Does Patient Have a Medical Advance Directive? No Yes No Patient has advance directive, copy not in chart  Patient has advance directive, copy not in chart  Type of Advance Directive  Living will;Healthcare Power of [employer] Power of [employer] Power of [place];Living will  Copy of Healthcare Power of Attorney in Chart?  No - copy requested    Copy requested from family  Would patient like information on creating a medical advance directive? No - Patient declined  No - Patient declined     Pre-existing out of facility DNR order (yellow form or pink MOST form)    No No No    Current Medications (verified) Outpatient Encounter Medications as of 05/09/2022  Medication Sig   losartan (COZAAR) 100 MG tablet Take 1 tablet (100 mg total) by mouth daily.   pravastatin (PRAVACHOL) 40 MG tablet Take 1 tablet (40 mg total) by mouth daily.   No facility-administered encounter medications on file as of 05/09/2022.    Allergies (verified) Patient has no known allergies.   History: Past Medical History:  Diagnosis Date   ANEMIA, IRON DEFICIENCY  07/22/2009   ASTHMA 07/22/2009   mild   GERD 07/22/2009   HYPERLIPIDEMIA 07/22/2009   Kidney stones 2001   6 times in past   OTITIS MEDIA, SUPPURATIVE 11/09/2009   PREDIABETES 07/22/2009   pt does not monitor cbg at home   Past Surgical History:  Procedure Laterality Date   BACK SURGERY     lumbar x 2   BIOPSY BOWEL  01/30/1980   exploration bowel surgery, chrons disease   CHOLECYSTECTOMY  02/26/2011   Procedure: LAPAROSCOPIC CHOLECYSTECTOMY WITH INTRAOPERATIVE CHOLANGIOGRAM;  Surgeon: Velora Heckler, MD;  Location: [facility];  Service: General;  Laterality: N/A;   colonscopy  2012 and 2006   HERNIA REPAIR  2005 - approx   TONSILLECTOMY  1960 - approx   Family History  Problem Relation Age of Onset   Cancer Father        colon   Hypertension Maternal Grandfather    Stroke Maternal Grandfather    Pseudotumor cerebri Son    Social History   Socioeconomic History   Marital status: Married    Spouse name: Not on file   Number of children: Not on file   Years of education: Not on file   Highest education level: Not on file  Occupational History   Not on file  Tobacco Use   Smoking status: Never   Smokeless tobacco: Never  Vaping Use   Vaping  Use: Never used  Substance and Sexual Activity   Alcohol use: Yes    Comment: occasional glass of wine   Drug use: No   Sexual activity: Yes  Other Topics Concern   Not on file  Social History Narrative   Lives at home with wife    Right handed   Caffeine: 2 cups/day   Social Determinants of Health   Financial Resource Strain: Low Risk  (05/09/2022)   Overall Financial Resource Strain (CARDIA)    Difficulty of Paying Living Expenses: Not hard at all  Food Insecurity: No Food Insecurity (05/09/2022)   Hunger Vital Sign    Worried About Running Out of Food in the Last Year: Never true    Ran Out of Food in the Last Year: Never true  Transportation Needs: No Transportation Needs (05/09/2022)   PRAPARE - Scientist, research (physical sciences)Transportation    Lack of  Transportation (Medical): No    Lack of Transportation (Non-Medical): No  Physical Activity: Sufficiently Active (05/09/2022)   Exercise Vital Sign    Days of Exercise per Week: 5 days    Minutes of Exercise per Session: 60 min  Stress: No Stress Concern Present (05/09/2022)   [employer] - Occupational Stress Questionnaire    Feeling of Stress : Not at all  Social Connections: Socially Integrated (05/09/2022)   Social Connection and Isolation Panel [NHANES]    Frequency of Communication with Friends and Family: More than three times a week    Frequency of Social Gatherings with Friends and Family: More than three times a week    Attends Religious Services: More than 4 times per year    Active Member of [employer]ubs or Organizations: Yes    Attends Engineer, structuralClub or Organization Meetings: More than 4 times per year    Marital Status: Married    Tobacco Counseling Counseling given: Not Answered   Clinical Intake:  Pre-visit preparation completed: Yes  Pain : No/denies pain        How often do you need to have someone help you when you read instructions, pamphlets, or other written materials from your doctor or pharmacy?: (P) 1 - Never  Diabetic?  no  Interpreter Needed?: No  Information entered by :: Remi HaggardJulie Jusitn Salsgiver LPN   Activities of Daily Living    05/09/2022   11:43 AM 05/08/2022   12:41 PM  In your present state of health, do you have any difficulty performing the following activities:  Hearing? 1 0  Vision? 0 0  Difficulty concentrating or making decisions? 0 0  Walking or climbing stairs? 0 0  Dressing or bathing? 0 0  Doing errands, shopping? 0 0  Preparing Food and eating ? N N  Using the Toilet? N N  In the past six months, have you accidently leaked urine? N N  Do you have problems with loss of bowel control? N N  Managing your Medications? N N  Managing your Finances? N N  Housekeeping or managing your Housekeeping? N N    Patient Care  Team: Shade FloodGreene, Jeffrey R, MD as PCP - General (Family Medicine)  Indicate any recent Medical Services you may have received from other than [facility] providers in the past year (date may be approximate).     Assessment:   This is a routine wellness examination for Fredrik CoveRoger.  Hearing/Vision screen Hearing Screening - Comments:: Hearing aids Vision Screening - Comments:: [facility]ekerMiller Up to date  Dietary issues and exercise activities discussed: Current Exercise Habits: Structured exercise class;Home  exercise routine, Type of exercise: walking;strength training/weights;treadmill, Time (Minutes): 45, Frequency (Times/Week): 5, Weekly Exercise (Minutes/Week): 225   Goals Addressed             This Visit's Progress    Weight (lb) < 200 lb (90.7 kg)       205       Depression Screen    05/09/2022   11:47 AM 01/15/2022   11:19 AM 06/13/2021    8:20 AM 05/18/2019    2:06 PM 10/23/2017    8:35 AM  PHQ 2/9 Scores  PHQ - 2 Score 0 0 0 0 0  PHQ- 9 Score  2       Fall Risk    05/08/2022   12:41 PM 01/15/2022   11:19 AM 06/09/2021    9:27 AM  Fall Risk   Falls in the past year? 0 0 0  Number falls in past yr: 0 0   Injury with Fall? 0 0   Risk for fall due to :  No Fall Risks   Follow up  Falls evaluation completed     FALL RISK PREVENTION PERTAINING TO THE HOME:  Any stairs in or around the home? Yes  If so, are there any without handrails? No  Home free of loose throw rugs in walkways, pet beds, electrical cords, etc? Yes  Adequate lighting in your home to reduce risk of falls? Yes   ASSISTIVE DEVICES UTILIZED TO PREVENT FALLS:  Life alert? No  Use of a cane, walker or w/c? No  Grab bars in the bathroom? No  Shower chair or bench in shower? Yes  Elevated toilet seat or a handicapped toilet? Yes   TIMED UP AND GO:  Was the test performed? No .    Cognitive Function:  Patient declined  [facility] alzheimer         Immunizations Immunization History   Administered Date(s) Administered   Fluad Quad(high Dose 65+) 11/05/2018   Influenza,inj,Quad PF,6+ Mos 03/08/2015, 11/13/2016, 11/05/2017   Influenza-Unspecified 01/12/2016, 11/30/2019   PFIZER(Purple Top)SARS-COV-2 Vaccination 02/21/2019, 03/14/2019, 10/26/2019   Pfizer Covid-19 Vaccine Bivalent Booster 45yrs & up 11/08/2020   Pneumococcal Conjugate-13 05/10/2020   Pneumococcal Polysaccharide-23 11/05/2018   Tdap 05/05/2012   Zoster Recombinat (Shingrix) 12/24/2016, 03/12/2017    TDAP status: Due, Education has been provided regarding the importance of this vaccine. Advised may receive this vaccine at local pharmacy or Health Dept. Aware to provide a copy of the vaccination record if obtained from local pharmacy or Health Dept. Verbalized acceptance and understanding.  Flu Vaccine status: Up to date  Pneumococcal vaccine status: Up to date  Covid-19 vaccine status: Information provided on how to obtain vaccines.   Qualifies for Shingles Vaccine? No   Zostavax completed Yes   Shingrix Completed?: Yes  Screening Tests Health Maintenance  Topic Date Due   COVID-19 Vaccine (5 - 2023-24 season) 09/29/2021   DTaP/Tdap/Td (2 - Td or Tdap) 05/06/2022   INFLUENZA VACCINE  08/30/2022   Medicare Annual Wellness (AWV)  05/09/2023   COLONOSCOPY (Pts 45-70yrs Insurance coverage will need to be confirmed)  11/30/2029   Pneumonia Vaccine 80+ Years old  Completed   Hepatitis C Screening  Completed   Zoster Vaccines- Shingrix  Completed   HPV VACCINES  Aged Out    Health Maintenance  Health Maintenance Due  Topic Date Due   COVID-19 Vaccine (5 - 2023-24 season) 09/29/2021   DTaP/Tdap/Td (2 - Td or Tdap) 05/06/2022  Colorectal cancer screening: Type of screening: Colonoscopy. Completed 2021. Repeat every 10 years  Lung Cancer Screening: (Low Dose CT Chest recommended if Age 60-80 years, 30 pack-year currently smoking OR have quit w/in 15years.) does not qualify.   Lung Cancer  Screening Referral:   Additional Screening:  Hepatitis C Screening: does not qualify; Completed 2018  Vision Screening: Recommended annual ophthalmology exams for early detection of glaucoma and other disorders of the eye. Is the patient up to date with their annual eye exam?  Yes  Who is the provider or what is the name of the office in which the patient attends annual eye exams? [facility] If pt is not established with a provider, would they like to be referred to a provider to establish care? No .   Dental Screening: Recommended annual dental exams for proper oral hygiene  Community Resource Referral / Chronic Care Management: CRR required this visit?  No   CCM required this visit?  No      Plan:     I have personally reviewed and noted the following in the patient's chart:   Medical and social history Use of alcohol, tobacco or illicit drugs  Current medications and supplements including opioid prescriptions. Patient is not currently taking opioid prescriptions. Functional ability and status Nutritional status Physical activity Advanced directives List of other physicians Hospitalizations, surgeries, and ER visits in previous 12 months Vitals Screenings to include cognitive, depression, and falls Referrals and appointments  In addition, I have reviewed and discussed with patient certain preventive protocols, quality metrics, and best practice recommendations. A written personalized care plan for preventive services as well as general preventive health recommendations were provided to patient.     Remi Haggard, LPN   09/27/5619   Nurse Notes:

## 2022-05-09 NOTE — Patient Instructions (Signed)
Eric Duran , Thank you for taking time to come for your Medicare Wellness Visit. I appreciate your ongoing commitment to your health goals. Please review the following plan we discussed and let me know if I can assist you in the future.   Screening recommendations/referrals: Colonoscopy: up to date Recommended yearly ophthalmology/optometry visit for glaucoma screening and checkup Recommended yearly dental visit for hygiene and checkup  Vaccinations: Influenza vaccine: up to date Pneumococcal vaccine: up to date Tdap vaccine: Education provided       Preventive Care 65 Years and Older, Male Preventive care refers to lifestyle choices and visits with your health care provider that can promote health and wellness. What does preventive care include? A yearly physical exam. This is also called an annual well check. Dental exams once or twice a year. Routine eye exams. Ask your health care provider how often you should have your eyes checked. Personal lifestyle choices, including: Daily care of your teeth and gums. Regular physical activity. Eating a healthy diet. Avoiding tobacco and drug use. Limiting alcohol use. Practicing safe sex. Taking low doses of aspirin every day. Taking vitamin and mineral supplements as recommended by your health care provider. What happens during an annual well check? The services and screenings done by your health care provider during your annual well check will depend on your age, overall health, lifestyle risk factors, and family history of disease. Counseling  Your health care provider may ask you questions about your: Alcohol use. Tobacco use. Drug use. Emotional well-being. Home and relationship well-being. Sexual activity. Eating habits. History of falls. Memory and ability to understand (cognition). Work and work Astronomer. Screening  You may have the following tests or measurements: Height, weight, and BMI. Blood pressure. Lipid  and cholesterol levels. These may be checked every 5 years, or more frequently if you are over 38 years old. Skin check. Lung cancer screening. You may have this screening every year starting at age 65 if you have a 30-pack-year history of smoking and currently smoke or have quit within the past 15 years. Fecal occult blood test (FOBT) of the stool. You may have this test every year starting at age 77. Flexible sigmoidoscopy or colonoscopy. You may have a sigmoidoscopy every 5 years or a colonoscopy every 10 years starting at age 4. Prostate cancer screening. Recommendations will vary depending on your family history and other risks. Hepatitis C blood test. Hepatitis B blood test. Sexually transmitted disease (STD) testing. Diabetes screening. This is done by checking your blood sugar (glucose) after you have not eaten for a while (fasting). You may have this done every 1-3 years. Abdominal aortic aneurysm (AAA) screening. You may need this if you are a current or former smoker. Osteoporosis. You may be screened starting at age 32 if you are at high risk. Talk with your health care provider about your test results, treatment options, and if necessary, the need for more tests. Vaccines  Your health care provider may recommend certain vaccines, such as: Influenza vaccine. This is recommended every year. Tetanus, diphtheria, and acellular pertussis (Tdap, Td) vaccine. You may need a Td booster every 10 years. Zoster vaccine. You may need this after age 59. Pneumococcal 13-valent conjugate (PCV13) vaccine. One dose is recommended after age 75. Pneumococcal polysaccharide (PPSV23) vaccine. One dose is recommended after age 35. Talk to your health care provider about which screenings and vaccines you need and how often you need them. This information is not intended to replace advice given to  you by your health care provider. Make sure you discuss any questions you have with your health care  provider. Document Released: 02/11/2015 Document Revised: 10/05/2015 Document Reviewed: 11/16/2014 Elsevier Interactive Patient Education  2017 [employer]  Fall Prevention in the Home Falls can cause injuries. They can happen to people of all ages. There are many things you can do to make your home safe and to help prevent falls. What can I do on the outside of my home? Regularly fix the edges of walkways and driveways and fix any cracks. Remove anything that might make you trip as you walk through a door, such as a raised step or threshold. Trim any bushes or trees on the path to your home. Use bright outdoor lighting. Clear any walking paths of anything that might make someone trip, such as rocks or tools. Regularly check to see if handrails are loose or broken. Make sure that both sides of any steps have handrails. Any raised decks and porches should have guardrails on the edges. Have any leaves, snow, or ice cleared regularly. Use sand or salt on walking paths during winter. Clean up any spills in your garage right away. This includes oil or grease spills. What can I do in the bathroom? Use night lights. Install grab bars by the toilet and in the tub and shower. Do not use towel bars as grab bars. Use non-skid mats or decals in the tub or shower. If you need to sit down in the shower, use a plastic, non-slip stool. Keep the floor dry. Clean up any water that spills on the floor as soon as it happens. Remove soap buildup in the tub or shower regularly. Attach bath mats securely with double-sided non-slip rug tape. Do not have throw rugs and other things on the floor that can make you trip. What can I do in the bedroom? Use night lights. Make sure that you have a light by your bed that is easy to reach. Do not use any sheets or blankets that are too big for your bed. They should not hang down onto the floor. Have a firm chair that has side arms. You can use this for support while  you get dressed. Do not have throw rugs and other things on the floor that can make you trip. What can I do in the kitchen? Clean up any spills right away. Avoid walking on wet floors. Keep items that you use a lot in easy-to-reach places. If you need to reach something above you, use a strong step stool that has a grab bar. Keep electrical cords out of the way. Do not use floor polish or wax that makes floors slippery. If you must use wax, use non-skid floor wax. Do not have throw rugs and other things on the floor that can make you trip. What can I do with my stairs? Do not leave any items on the stairs. Make sure that there are handrails on both sides of the stairs and use them. Fix handrails that are broken or loose. Make sure that handrails are as long as the stairways. Check any carpeting to make sure that it is firmly attached to the stairs. Fix any carpet that is loose or worn. Avoid having throw rugs at the top or bottom of the stairs. If you do have throw rugs, attach them to the floor with carpet tape. Make sure that you have a light switch at the top of the stairs and the bottom of the stairs.  If you do not have them, ask someone to add them for you. What else can I do to help prevent falls? Wear shoes that: Do not have high heels. Have rubber bottoms. Are comfortable and fit you well. Are closed at the toe. Do not wear sandals. If you use a stepladder: Make sure that it is fully opened. Do not climb a closed stepladder. Make sure that both sides of the stepladder are locked into place. Ask someone to hold it for you, if possible. Clearly mark and make sure that you can see: Any grab bars or handrails. First and last steps. Where the edge of each step is. Use tools that help you move around (mobility aids) if they are needed. These include: Canes. Walkers. Scooters. Crutches. Turn on the lights when you go into a dark area. Replace any light bulbs as soon as they burn  out. Set up your furniture so you have a clear path. Avoid moving your furniture around. If any of your floors are uneven, fix them. If there are any pets around you, be aware of where they are. Review your medicines with your doctor. Some medicines can make you feel dizzy. This can increase your chance of falling. Ask your doctor what other things that you can do to help prevent falls. This information is not intended to replace advice given to you by your health care provider. Make sure you discuss any questions you have with your health care provider. Document Released: 11/11/2008 Document Revised: 06/23/2015 Document Reviewed: 02/19/2014 Elsevier Interactive Patient Education  2017 [employer].

## 2022-06-08 IMAGING — MR MR LUMBAR SPINE W/O CM
4 of 5 series · 27 of 48 positions shown · non-contrast
Comparison: Lumbar radiographs 11/24/2019.  Lumbar MRI 10/19/2017

CLINICAL DATA: Low back pain. Bilateral posterior leg numbness.
Prior back surgery.

EXAM:
MRI LUMBAR SPINE WITHOUT CONTRAST
TECHNIQUE: Multiplanar, multisequence MR imaging of the lumbar spine was
performed. No intravenous contrast was administered.

[Series 3: T2 · sagittal · 4.0mm · 0.81mm/px · 6 of 15 slices shown (1 of 2)]
[im 1/15]
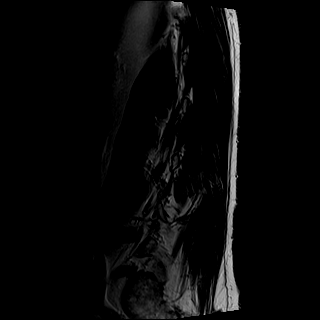
[im 3/15]
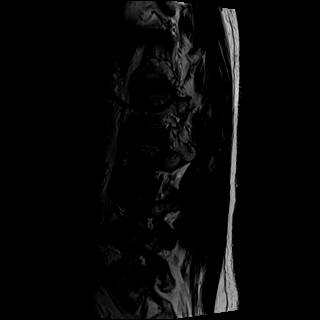
[im 6/15]
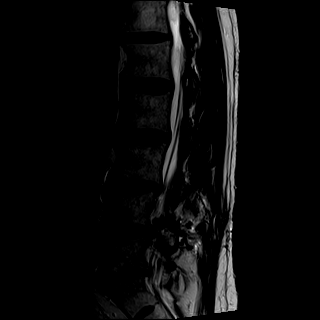
[im 9/15]
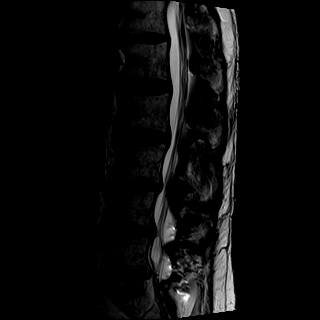
[im 12/15]
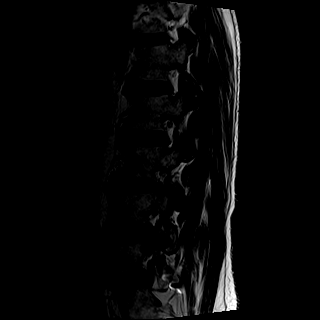
[im 15/15]
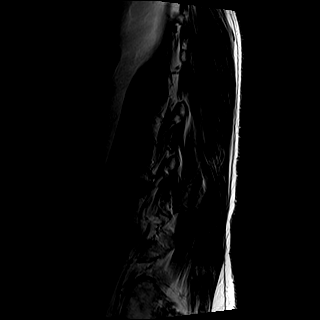

[Series 4: T1 · sagittal · 4.0mm · 0.41mm/px · 5 of 15 slices shown (1 of 2)]
[im 1/15]
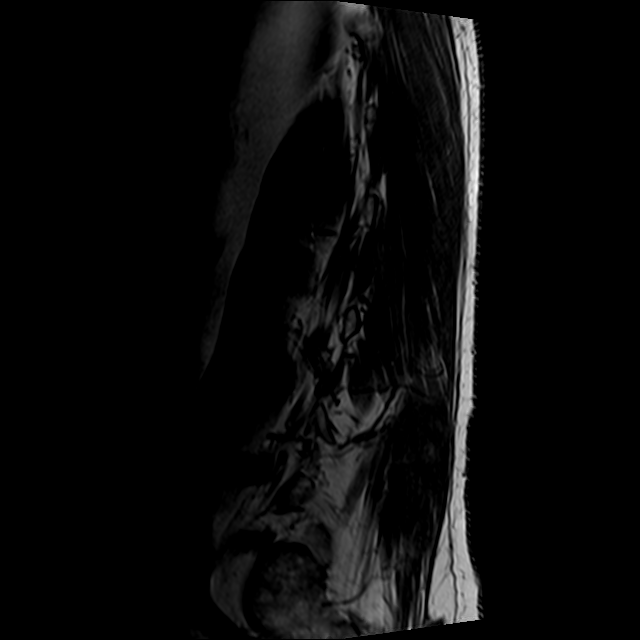
[im 4/15]
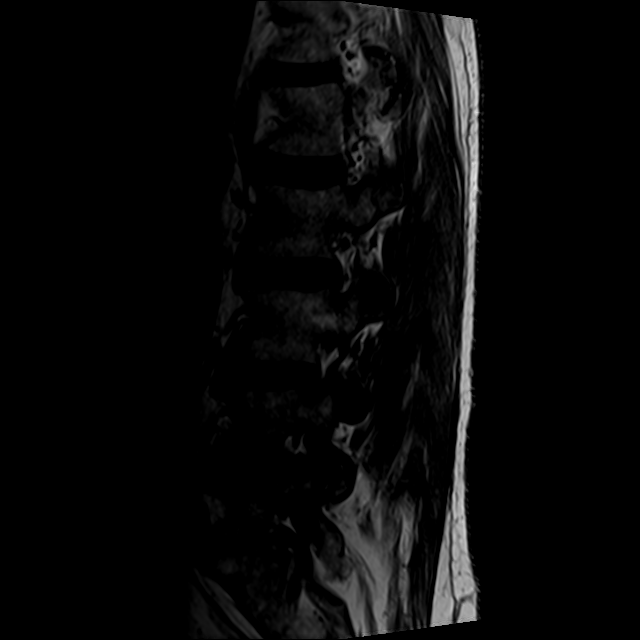
[im 8/15]
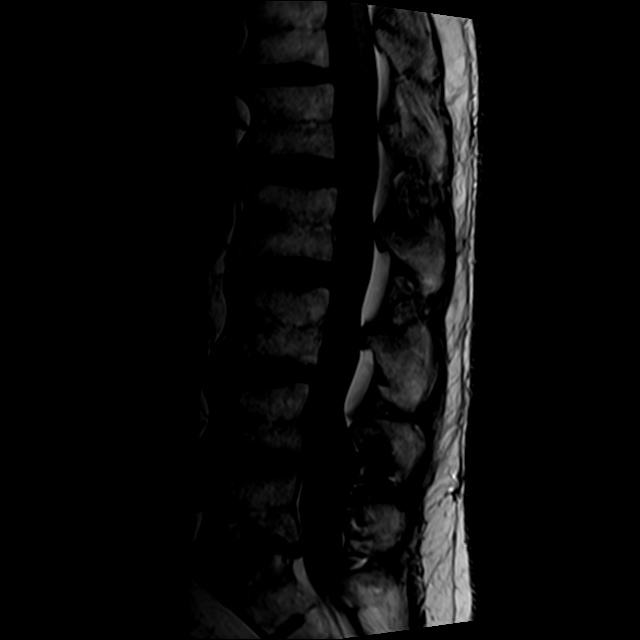
[im 11/15]
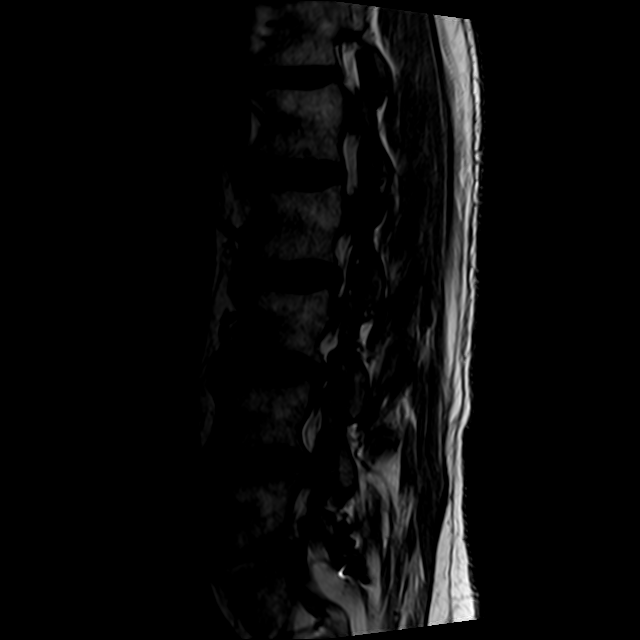
[im 15/15]
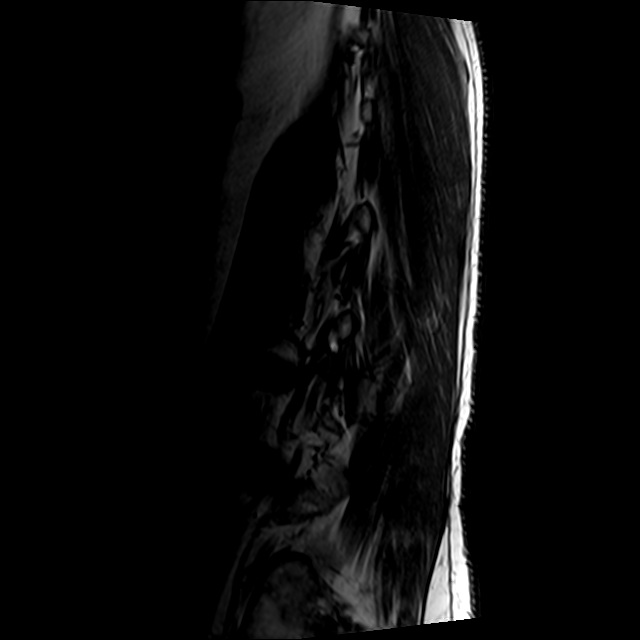

[Series 6: T2 · axial · 4.0mm · 0.78mm/px · z∈[-83,+130]mm · 10 of 43 slices shown (2 of 2)]
[im 3/43]
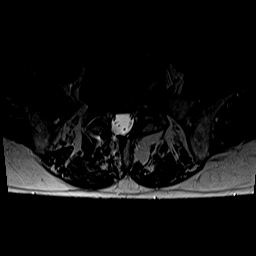
[im 6/43]
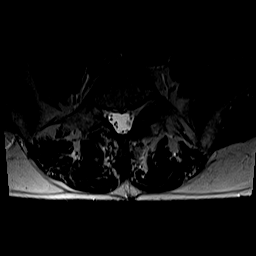
[im 9/43]
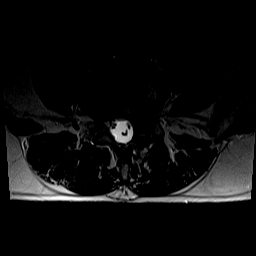
[im 15/43]
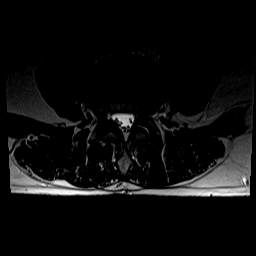
[im 20/43]
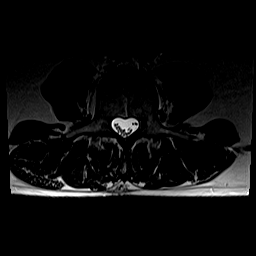
[im 23/43]
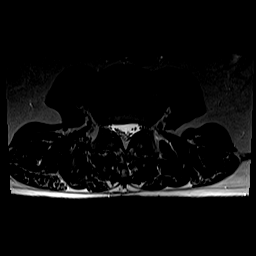
[im 26/43]
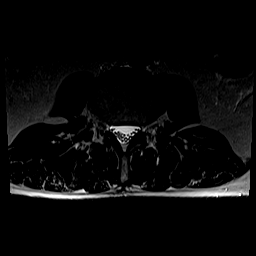
[im 31/43]
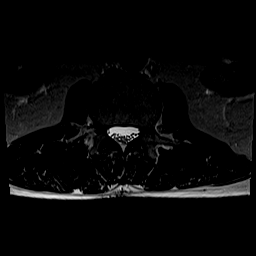
[im 37/43]
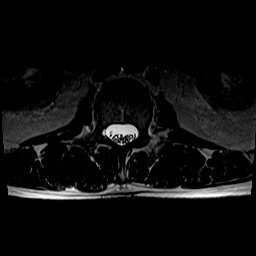
[im 43/43]
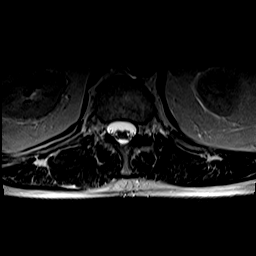

[Series 7: T1 · axial · 4.0mm · 0.39mm/px · z∈[-83,+101]mm · 6 of 43 slices shown (2 of 2)]
[im 3/43]
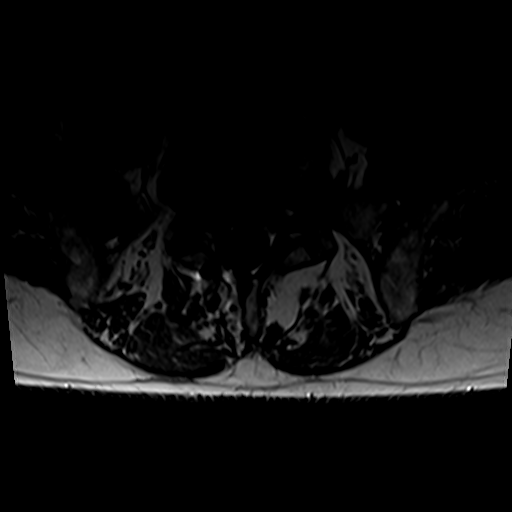
[im 6/43]
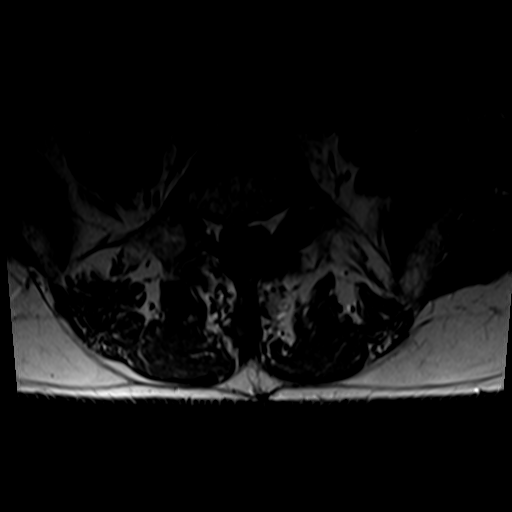
[im 9/43]
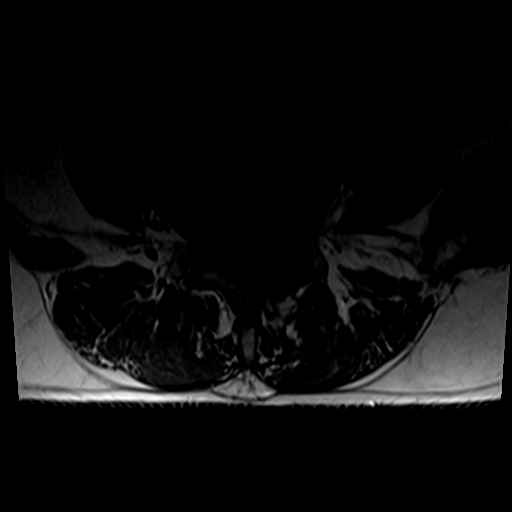
[im 15/43]
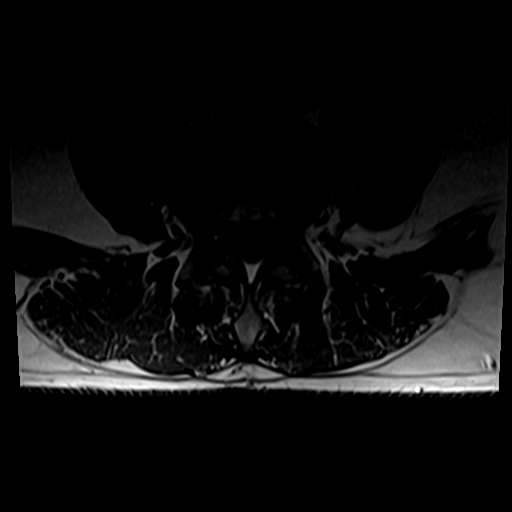
[im 23/43]
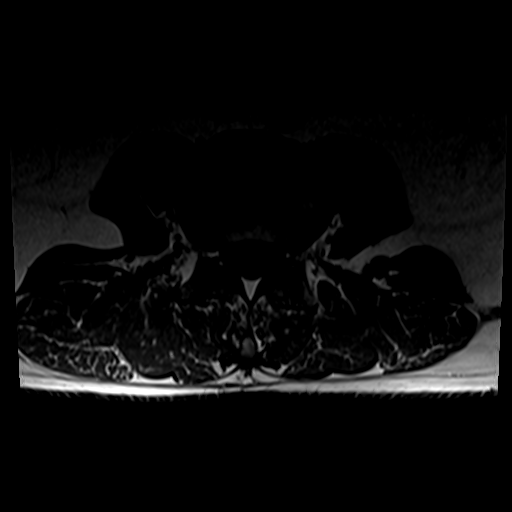
[im 37/43]
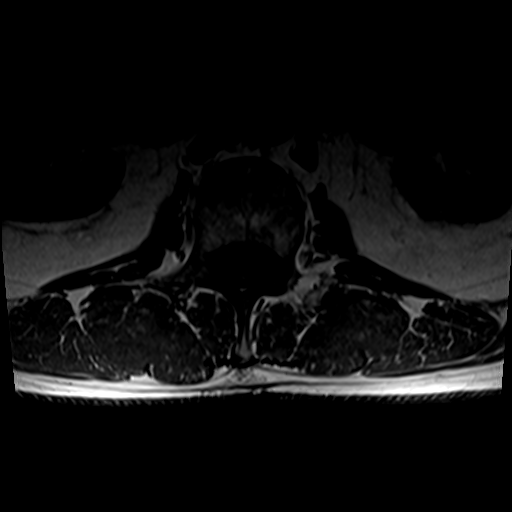

[27 of 48 positions shown; findings below may reference images not displayed]

FINDINGS: Segmentation:  Normal.

Alignment:  Mild lumbar kyphosis.

Mild retrolisthesis L3-4, L4-5, L5-S1.

Vertebrae:  Normal bone marrow.  Negative for fracture or mass.

Conus medullaris and cauda equina: Conus extends to the L1-2 level.
Conus and cauda equina appear normal.

Paraspinal and other soft tissues: Negative for paraspinous mass,
adenopathy, or fluid collection.

Disc levels:

L1-2: Mild disc bulging and mild facet degeneration. Negative for
stenosis

L2-3: Mild disc and mild facet degeneration. Negative for disc
protrusion. Mild subarticular stenosis bilaterally.

L3-4: Diffuse disc bulging with endplate spurring. Bilateral facet
degeneration. Moderate subarticular stenosis bilaterally right
greater than left unchanged. Spinal canal adequate in size

L4-5: Interval laminectomy and resection of extruded disc fragment
in the midline. No recurrent disc protrusion. Diffuse endplate
spurring remains with mild to moderate subarticular stenosis
bilaterally. Spinal canal adequately decompressed.

L5-S1: Right laminectomy with decompression of the right S1 nerve
root. Mild facet degeneration bilaterally. Advanced disc
degeneration with diffuse endplate spurring causing mild
subarticular stenosis bilaterally.
IMPRESSION: Mild subarticular stenosis bilaterally L2-3 unchanged. Moderate
subarticular stenosis bilaterally L3-4 unchanged

Interval laminectomy and discectomy at L4-5. No recurrent disc
protrusion. Mild to moderate subarticular stenosis bilaterally

Right laminectomy L5-S1. Diffuse endplate spurring causing mild
subarticular stenosis bilaterally

## 2022-06-23 ENCOUNTER — Encounter: Payer: Self-pay | Admitting: Family Medicine

## 2022-06-26 MED ORDER — LOSARTAN POTASSIUM 50 MG PO TABS: 50.0000 mg | ORAL_TABLET | Freq: Every day | ORAL | 1 refills | Status: DC

## 2022-06-30 ENCOUNTER — Other Ambulatory Visit: Payer: Self-pay | Admitting: Family Medicine

## 2022-07-03 ENCOUNTER — Encounter: Payer: Self-pay | Admitting: Family Medicine

## 2022-07-19 ENCOUNTER — Telehealth: Payer: Self-pay

## 2022-07-19 ENCOUNTER — Encounter: Payer: Self-pay | Admitting: Family Medicine

## 2022-07-19 ENCOUNTER — Ambulatory Visit (INDEPENDENT_AMBULATORY_CARE_PROVIDER_SITE_OTHER): Payer: Medicare Other | Admitting: Family Medicine

## 2022-07-19 VITALS — BP 124/60 | HR 54 | Temp 97.9°F | Ht 74.0 in | Wt 192.0 lb

## 2022-07-19 DIAGNOSIS — I10 Essential (primary) hypertension: Secondary | ICD-10-CM

## 2022-07-19 DIAGNOSIS — Z818 Family history of other mental and behavioral disorders: Secondary | ICD-10-CM | POA: Diagnosis not present

## 2022-07-19 DIAGNOSIS — R7309 Other abnormal glucose: Secondary | ICD-10-CM

## 2022-07-19 DIAGNOSIS — E785 Hyperlipidemia, unspecified: Secondary | ICD-10-CM

## 2022-07-19 DIAGNOSIS — R7989 Other specified abnormal findings of blood chemistry: Secondary | ICD-10-CM

## 2022-07-19 LAB — LIPID PANEL
Cholesterol: 174 mg/dL (ref 0–200)
HDL: 80.8 mg/dL (ref >39.00)
LDL Cholesterol: 86 mg/dL (ref 0–99)
NonHDL: 93.5
Total CHOL/HDL Ratio: 2
Triglycerides: 38 mg/dL (ref 0.0–149.0)
VLDL: 7.6 mg/dL (ref 0.0–40.0)

## 2022-07-19 LAB — COMPREHENSIVE METABOLIC PANEL
ALT: 24 U/L (ref 0–53)
AST: 26 U/L (ref 0–37)
Albumin: 4.6 g/dL (ref 3.5–5.2)
Alkaline Phosphatase: 48 U/L (ref 39–117)
BUN: 17 mg/dL (ref 6–23)
CO2: 25 mEq/L (ref 19–32)
Calcium: 9.9 mg/dL (ref 8.4–10.5)
Chloride: 94 mEq/L — ABNORMAL LOW (ref 96–112)
Creatinine, Ser: 1.12 mg/dL (ref 0.40–1.50)
GFR: 67.33 mL/min (ref >60.00)
Glucose, Bld: 85 mg/dL (ref 70–99)
Potassium: 4.7 mEq/L (ref 3.5–5.1)
Sodium: 131 mEq/L — ABNORMAL LOW (ref 135–145)
Total Bilirubin: 1.3 mg/dL — ABNORMAL HIGH (ref 0.2–1.2)
Total Protein: 7.4 g/dL (ref 6.0–8.3)

## 2022-07-19 LAB — HEMOGLOBIN A1C: Hgb A1c MFr Bld: 5.4 % (ref 4.6–6.5)

## 2022-07-19 LAB — VITAMIN B12: Vitamin B-12: 901 pg/mL (ref 211–911)

## 2022-07-19 LAB — VITAMIN D 25 HYDROXY (VIT D DEFICIENCY, FRACTURES): VITD: 107.11 ng/mL (ref 30.00–100.00)

## 2022-07-19 MED ORDER — PRAVASTATIN SODIUM 40 MG PO TABS: 40.0000 mg | ORAL_TABLET | Freq: Every day | ORAL | 0 refills | Status: DC

## 2022-07-19 NOTE — Patient Instructions (Addendum)
If lower blood pressures at home, could decrease losartan further. Let me know.  You may be able to schedule the CT Cardiac scoring test on your own.  This test is a self pay test that insurance doesn't cover. You can call to get this scheduled yourself at [phone]. They will be able to tell you the cost and get you set up for a time that works best for you.  Depending on results can discuss need for continued statin medication.   Keep up the good work with diet.  I will check labs today, then can discuss next step if abnormal.  Check out MIND diet for another memory prevention tool.  Take care.

## 2022-07-19 NOTE — Telephone Encounter (Addendum)
Please contact patient.  Vitamin D level was very high.  Should cut back on his vitamin D supplement, initially hold that for 1 week, then restart at half of what he is taking with recheck labs in the next 2 weeks.  Let me know if there are questions.  In order to place lab only order will need ABN signed.

## 2022-07-19 NOTE — Progress Notes (Signed)
Subjective:  Patient ID: Eric Duran, male    DOB: 22-Jul-1955  Age: 67 y.o. MRN: [id number]  CC:  Chief Complaint  Patient presents with   Medical Management of Chronic Issues    Pt needs colesterole meds     HPI Eric Duran presents for   Hypertension: Treated with losartan, 50 mg daily.  See message in May, weight had improved, lower readings at home, decrease losartan dosage at that time to 50 mg -prior 100 mg daily.   Labs in March, prior hyperkalemia on outside labs for study he was participating in.  Question of prolonged PR interval as well on that workup, normal on in office evaluation. Home readings: 115/63.  No new side effects, and tolerating lower does.  BP Readings from Last 3 Encounters:  07/19/22 124/60  04/11/22 122/64  02/07/22 132/76   Lab Results  Component Value Date   CREATININE 1.28 04/11/2022   Hyperlipidemia: Pravastatin 40 mg daily. No new myalgias/side effects. On statin for past 10 years.  Fasting today.  Some of his reading - question need for statin. Would like to consider CCS.  No FH of early CAD, but CVA - grandmother. Father with valve disease.  No recent need for omeprazole, prior daily dosing until January. The 10-year ASCVD risk score (Arnett DK, et al., 2019) is: 13.6%   Values used to calculate the score:     Age: 19 years     Sex: Male     Is Non-Hispanic African American: No     Diabetic: No     Tobacco smoker: No     Systolic Blood Pressure: 124 mmHg     Is BP treated: Yes     HDL Cholesterol: 69.8 mg/dL     Total Cholesterol: 173 mg/dL   Lab Results  Component Value Date   CHOL 173 01/15/2022   HDL 69.80 01/15/2022   LDLCALC 94 01/15/2022   LDLDIRECT 173.8 05/05/2012   TRIG 48.0 01/15/2022   CHOLHDL 2 01/15/2022   Lab Results  Component Value Date   ALT 24 01/15/2022   AST 19 01/15/2022   ALKPHOS 51 01/15/2022   BILITOT 0.9 01/15/2022   Memory question Tried to enroll in trial at the [facility].  Was not in trial. 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 Brother with mild cognitive deficit, mother had alzhemiers and parkinsons, grandfather with dementia.  He has been researching memory/Alzheimer's. Has been 2 doctors regarding Alzheimers. One MD and other is epidemiologist.  No personal memory concners, other than normal changes of aging. Brain fog at times. Had multiple tests at [facility] above, no issues with memory test.  Question of B12 , vitamin D (on supplement), omega-3 supplement, homocysteine, cortisol levels? Borderline prediabetic on last A1c. Has lost weight with exercise, diet. Intermittent fasting at time.  Lab Results  Component Value Date   HGBA1C 5.7 01/15/2022   Wt Readings from Last 3 Encounters:  07/19/22 192 lb (87.1 kg)  04/11/22 212 lb 6.4 oz (96.3 kg)  02/07/22 227 lb 9.6 oz (103.2 kg)    History Patient Active Problem List   Diagnosis Date Noted   Previous back surgery 11/24/2019   Hypertension 08/20/2016   Gallstones 02/22/2011   Abdominal pain, right upper quadrant 02/22/2011   Crohn's 02/13/2011   OTITIS MEDIA, SUPPURATIVE 11/09/2009   Dyslipidemia 07/22/2009   ANEMIA, IRON DEFICIENCY 07/22/2009   ASTHMA 07/22/2009   GERD 07/22/2009   PREDIABETES 07/22/2009   NONSPECIFIC ABNORM  RESULTS KIDNEY FUNCTION STUDY 07/22/2009   Past Medical History:  Diagnosis Date   ANEMIA, IRON DEFICIENCY 07/22/2009   ASTHMA 07/22/2009   mild   GERD 07/22/2009   HYPERLIPIDEMIA 07/22/2009   Kidney stones 2001   6 times in past   OTITIS MEDIA, SUPPURATIVE 11/09/2009   PREDIABETES 07/22/2009   pt does not monitor cbg at home   Past Surgical History:  Procedure Laterality Date   BACK SURGERY     lumbar x 2   BIOPSY BOWEL  01/30/1980   exploration bowel surgery, chrons disease   CHOLECYSTECTOMY  02/26/2011   Procedure: LAPAROSCOPIC CHOLECYSTECTOMY WITH INTRAOPERATIVE CHOLANGIOGRAM;  Surgeon: Velora Heckler, MD;  Location: [facility];  Service: General;  Laterality:  N/A;   colonscopy  2012 and 2006   HERNIA REPAIR  2005 - approx   TONSILLECTOMY  1960 - approx   No Known Allergies Prior to Admission medications   Medication Sig Start Date End Date Taking? Authorizing Provider  losartan (COZAAR) 50 MG tablet Take 1 tablet (50 mg total) by mouth daily. 06/26/22  Yes Shade Flood, MD  pravastatin (PRAVACHOL) 40 MG tablet TAKE ONE TABLET BY MOUTH DAILY 07/02/22  Yes Burchette, Elberta Fortis, MD   Social History   Socioeconomic History   Marital status: Married    Spouse name: Not on file   Number of children: Not on file   Years of education: Not on file   Highest education level: Not on file  Occupational History   Not on file  Tobacco Use   Smoking status: Never   Smokeless tobacco: Never  Vaping Use   Vaping Use: Never used  Substance and Sexual Activity   Alcohol use: Yes    Comment: occasional glass of wine   Drug use: No   Sexual activity: Yes  Other Topics Concern   Not on file  Social History Narrative   Lives at home with wife    Right handed   Caffeine: 2 cups/day   Social Determinants of Health   Financial Resource Strain: Low Risk  (05/09/2022)   Overall Financial Resource Strain (CARDIA)    Difficulty of Paying Living Expenses: Not hard at all  Food Insecurity: No Food Insecurity (05/09/2022)   Hunger Vital Sign    Worried About Running Out of Food in the Last Year: Never true    Ran Out of Food in the Last Year: Never true  Transportation Needs: No Transportation Needs (05/09/2022)   PRAPARE - Administrator, Civil Service (Medical): No    Lack of Transportation (Non-Medical): No  Physical Activity: Sufficiently Active (05/09/2022)   Exercise Vital Sign    Days of Exercise per Week: 5 days    Minutes of Exercise per Session: 60 min  Stress: No Stress Concern Present (05/09/2022)   [employer] of Occupational Health - Occupational Stress Questionnaire    Feeling of Stress : Not at all  Social Connections:  Socially Integrated (05/09/2022)   Social Connection and Isolation Panel [NHANES]    Frequency of Communication with Friends and Family: More than three times a week    Frequency of Social Gatherings with Friends and Family: More than three times a week    Attends Religious Services: More than 4 times per year    Active Member of [employer] or Organizations: Yes    Attends Engineer, structural: More than 4 times per year    Marital Status: Married  Catering manager Violence: Not on  file    Review of Systems Per HPI   Objective:   Vitals:   07/19/22 1052  BP: 124/60  Pulse: (!) 54  Temp: 97.9 F (36.6 C)  TempSrc: Temporal  SpO2: 98%  Weight: 192 lb (87.1 kg)  Height: 6\' 2"  (1.88 m)     Physical Exam Vitals reviewed.  Constitutional:      Appearance: He is well-developed.  HENT:     Head: Normocephalic and atraumatic.  Neck:     Vascular: No carotid bruit or JVD.  Cardiovascular:     Rate and Rhythm: Normal rate and regular rhythm.     Heart sounds: Normal heart sounds. No murmur heard. Pulmonary:     Effort: Pulmonary effort is normal.     Breath sounds: Normal breath sounds. No rales.  Musculoskeletal:     Right lower leg: No edema.     Left lower leg: No edema.  Skin:    General: Skin is warm and dry.  Neurological:     Mental Status: He is alert and oriented to person, place, and time.  Psychiatric:        Mood and Affect: Mood normal.      Results for orders placed or performed in visit on 07/19/22  Comprehensive metabolic panel  Result Value Ref Range   Sodium 131 (L) 135 - 145 mEq/L   Potassium 4.7 3.5 - 5.1 mEq/L   Chloride 94 (L) 96 - 112 mEq/L   CO2 25 19 - 32 mEq/L   Glucose, Bld 85 70 - 99 mg/dL   BUN 17 6 - 23 mg/dL   Creatinine, Ser 1.61 0.40 - 1.50 mg/dL   Total Bilirubin 1.3 (H) 0.2 - 1.2 mg/dL   Alkaline Phosphatase 48 39 - 117 U/L   AST 26 0 - 37 U/L   ALT 24 0 - 53 U/L   Total Protein 7.4 6.0 - 8.3 g/dL   Albumin 4.6 3.5 -  5.2 g/dL   GFR 09.60 >45.40 mL/min   Calcium 9.9 8.4 - 10.5 mg/dL  Lipid panel  Result Value Ref Range   Cholesterol 174 0 - 200 mg/dL   Triglycerides 98.1 0.0 - 149.0 mg/dL   HDL 19.14 >78.29 mg/dL   VLDL 7.6 0.0 - 56.2 mg/dL   LDL Cholesterol 86 0 - 99 mg/dL   Total CHOL/HDL Ratio 2    NonHDL 93.50   Hemoglobin A1c  Result Value Ref Range   Hgb A1c MFr Bld 5.4 4.6 - 6.5 %  B12  Result Value Ref Range   Vitamin B-12 901 211 - 911 pg/mL  Vitamin D (25 hydroxy)  Result Value Ref Range   VITD 107.11 (HH) 30.00 - 100.00 ng/mL  Homocysteine  Result Value Ref Range   Homocysteine 20.1 (H) <11.4 umol/L     Assessment & Plan:  Eric Duran is a 67 y.o. male . Primary hypertension  -Stable with current regimen, continue same, monitoring with RTC precautions.  Screening labs reassuring.  PREDIABETES - Plan: Hemoglobin A1c  -Improved, now in normal range.  Continue to watch diet, exercise  Dyslipidemia - Plan: pravastatin (PRAVACHOL) 40 MG tablet, Comprehensive metabolic panel, Lipid panel  -Tolerating pravastatin, question of need to continue.  Plan for coronary calcium scoring to assist in that decision.  Can refer if needed, phone number provided for scheduling.  LDL 86, no changes for now.  Mild elevated LFTs, will recheck in 2 weeks.  Family history of dementia - Plan: B12, Vitamin D (  25 hydroxy), Homocysteine  -Reports testing through other system was normal for memory.  Appropriate concern for family history of dementia and preventative approach.  B12 normal.  Homocystine pending.  Vitamin D level is elevated, plan to hold meds temporarily, recheck levels in 2 weeks.  No orders of the defined types were placed in this encounter.  Patient Instructions  If lower blood pressures at home, could decrease losartan further. Let me know.  You may be able to schedule the CT Cardiac scoring test on your own.  This test is a self pay test that insurance doesn't cover. You can call to  get this scheduled yourself at [phone]. They will be able to tell you the cost and get you set up for a time that works best for you.  Depending on results can discuss need for continued statin medication.   Keep up the good work with diet.  I will check labs today, then can discuss next step if abnormal.  Check out MIND diet for another memory prevention tool.  Take care.         Signed,   Meredith Staggers, MD [facility], [facility] 07/19/22 11:08 AM

## 2022-07-19 NOTE — Telephone Encounter (Signed)
Critical lab call received about critical High Vitamin D of 107.11 for this patient please advise

## 2022-07-20 ENCOUNTER — Other Ambulatory Visit: Payer: Self-pay | Admitting: Family Medicine

## 2022-07-20 ENCOUNTER — Encounter: Payer: Self-pay | Admitting: Family Medicine

## 2022-07-20 ENCOUNTER — Other Ambulatory Visit: Payer: Self-pay

## 2022-07-20 DIAGNOSIS — E871 Hypo-osmolality and hyponatremia: Secondary | ICD-10-CM

## 2022-07-20 DIAGNOSIS — R6882 Decreased libido: Secondary | ICD-10-CM

## 2022-07-20 DIAGNOSIS — R7989 Other specified abnormal findings of blood chemistry: Secondary | ICD-10-CM

## 2022-07-20 LAB — HOMOCYSTEINE: Homocysteine: 20.1 umol/L — ABNORMAL HIGH (ref <11.4)

## 2022-07-20 NOTE — Progress Notes (Unsigned)
Future labs ordered, testosterone and CMP but also needs vitamin D level ordered.  Unable to place that order without ABN.  How can we notate on his lab visit to order that test and print ABN at that time?

## 2022-07-20 NOTE — Progress Notes (Signed)
Pt has appt. 07/27/2022

## 2022-07-20 NOTE — Telephone Encounter (Signed)
Left vm to call about labs 

## 2022-07-20 NOTE — Progress Notes (Signed)
Left vm for pt to call and make appt. ABN needs to be signed and Vitamin D needs to be added to the orders. Told pt to ask for CMA to make appt.

## 2022-07-20 NOTE — Telephone Encounter (Signed)
See mychart messages

## 2022-07-20 NOTE — Telephone Encounter (Signed)
Patient requesting to also have testosterone test  Please advise?

## 2022-07-23 ENCOUNTER — Telehealth: Payer: Self-pay | Admitting: Family Medicine

## 2022-07-23 DIAGNOSIS — E785 Hyperlipidemia, unspecified: Secondary | ICD-10-CM

## 2022-07-23 NOTE — Telephone Encounter (Signed)
Noted - will place order for CT.   The 10-year ASCVD risk score (Arnett DK, et al., 2019) is: 12.7%   Values used to calculate the score:     Age: 67 years     Sex: Male     Is Non-Hispanic African American: No     Diabetic: No     Tobacco smoker: No     Systolic Blood Pressure: 124 mmHg     Is BP treated: Yes     HDL Cholesterol: 80.8 mg/dL     Total Cholesterol: 174 mg/dL

## 2022-07-23 NOTE — Telephone Encounter (Signed)
Caller name: Georgette Shell  On [place]?: Yes  Call back number: [phone]  Provider they see: Shade Flood, MD  Reason for call:They need an order for CAT scan

## 2022-07-23 NOTE — Telephone Encounter (Signed)
This is from 07/19/22 visit with Neva Seat:   If lower blood pressures at home, could decrease losartan further. Let me know.  You may be able to schedule the CT Cardiac scoring test on your own.  This test is a self pay test that insurance doesn't cover. You can call to get this scheduled yourself at [phone]. They will be able to tell you the cost and get you set up for a time that works best for you.   Georgette Shell works at CT scan office refer back to the visit.

## 2022-07-23 NOTE — Telephone Encounter (Signed)
This is not a complete number. Do you have the number and who [place] with and why do we need a  Cat scan.

## 2022-07-24 NOTE — Telephone Encounter (Signed)
Pt informed

## 2022-07-25 ENCOUNTER — Encounter: Payer: Self-pay | Admitting: Family Medicine

## 2022-07-25 DIAGNOSIS — R7989 Other specified abnormal findings of blood chemistry: Secondary | ICD-10-CM

## 2022-07-25 DIAGNOSIS — Z818 Family history of other mental and behavioral disorders: Secondary | ICD-10-CM

## 2022-07-27 ENCOUNTER — Other Ambulatory Visit: Payer: Self-pay | Admitting: Family Medicine

## 2022-07-27 ENCOUNTER — Other Ambulatory Visit (INDEPENDENT_AMBULATORY_CARE_PROVIDER_SITE_OTHER): Payer: Medicare Other

## 2022-07-27 DIAGNOSIS — R6882 Decreased libido: Secondary | ICD-10-CM | POA: Diagnosis not present

## 2022-07-27 DIAGNOSIS — E871 Hypo-osmolality and hyponatremia: Secondary | ICD-10-CM

## 2022-07-27 DIAGNOSIS — E673 Hypervitaminosis D: Secondary | ICD-10-CM

## 2022-07-27 LAB — COMPREHENSIVE METABOLIC PANEL
ALT: 20 U/L (ref 0–53)
AST: 19 U/L (ref 0–37)
Albumin: 4.6 g/dL (ref 3.5–5.2)
Alkaline Phosphatase: 46 U/L (ref 39–117)
BUN: 17 mg/dL (ref 6–23)
CO2: 28 mEq/L (ref 19–32)
Calcium: 10.1 mg/dL (ref 8.4–10.5)
Chloride: 99 mEq/L (ref 96–112)
Creatinine, Ser: 1.17 mg/dL (ref 0.40–1.50)
GFR: 63.88 mL/min (ref >60.00)
Glucose, Bld: 102 mg/dL — ABNORMAL HIGH (ref 70–99)
Potassium: 4.7 mEq/L (ref 3.5–5.1)
Sodium: 136 mEq/L (ref 135–145)
Total Bilirubin: 1 mg/dL (ref 0.2–1.2)
Total Protein: 6.7 g/dL (ref 6.0–8.3)

## 2022-07-27 LAB — TESTOSTERONE: Testosterone: 334.47 ng/dL (ref 300.00–890.00)

## 2022-07-27 LAB — VITAMIN D 25 HYDROXY (VIT D DEFICIENCY, FRACTURES): VITD: 92.27 ng/mL (ref 30.00–100.00)

## 2022-07-27 NOTE — Telephone Encounter (Signed)
Referral placed, replied to patient.

## 2022-07-27 NOTE — Progress Notes (Signed)
Here for labs. Vitamin D required ABN. Ordered now - no ABN notification requested.

## 2022-08-01 ENCOUNTER — Other Ambulatory Visit: Payer: Self-pay | Admitting: Family Medicine

## 2022-08-01 DIAGNOSIS — E785 Hyperlipidemia, unspecified: Secondary | ICD-10-CM

## 2022-08-07 ENCOUNTER — Telehealth: Payer: Self-pay

## 2022-08-07 NOTE — Telephone Encounter (Signed)
Placed in front bin 

## 2022-08-07 NOTE — Telephone Encounter (Signed)
Placed in sign folder, will see if Clinton Sawyer can review on PCP absence

## 2022-08-13 NOTE — Telephone Encounter (Signed)
 Paperwork completed and placed in fax bin at back nurse station

## 2022-08-27 ENCOUNTER — Ambulatory Visit (HOSPITAL_BASED_OUTPATIENT_CLINIC_OR_DEPARTMENT_OTHER)
Admission: RE | Admit: 2022-08-27 | Discharge: 2022-08-27 | Disposition: A | Discharge status: Home / Self Care | Payer: Medicare Other | Source: Ambulatory Visit | Attending: Family Medicine | Admitting: Family Medicine

## 2022-08-27 DIAGNOSIS — E785 Hyperlipidemia, unspecified: Secondary | ICD-10-CM | POA: Insufficient documentation

## 2022-11-08 ENCOUNTER — Encounter: Payer: Self-pay | Admitting: Critical Care Medicine

## 2023-01-18 ENCOUNTER — Ambulatory Visit: Payer: Medicare Other | Admitting: Family Medicine

## 2023-01-18 VITALS — BP 114/60 | HR 55 | Temp 98.7°F | Ht 74.0 in | Wt 184.2 lb

## 2023-01-18 DIAGNOSIS — I77819 Aortic ectasia, unspecified site: Secondary | ICD-10-CM

## 2023-01-18 DIAGNOSIS — E785 Hyperlipidemia, unspecified: Secondary | ICD-10-CM | POA: Diagnosis not present

## 2023-01-18 DIAGNOSIS — G3184 Mild cognitive impairment, so stated: Secondary | ICD-10-CM

## 2023-01-18 DIAGNOSIS — I10 Essential (primary) hypertension: Secondary | ICD-10-CM | POA: Diagnosis not present

## 2023-01-18 DIAGNOSIS — R0683 Snoring: Secondary | ICD-10-CM

## 2023-01-18 DIAGNOSIS — R7309 Other abnormal glucose: Secondary | ICD-10-CM | POA: Diagnosis not present

## 2023-01-18 DIAGNOSIS — I700 Atherosclerosis of aorta: Secondary | ICD-10-CM

## 2023-01-18 DIAGNOSIS — Z818 Family history of other mental and behavioral disorders: Secondary | ICD-10-CM

## 2023-01-18 LAB — LIPID PANEL
Cholesterol: 235 mg/dL — ABNORMAL HIGH (ref 0–200)
HDL: 89.3 mg/dL (ref >39.00)
LDL Cholesterol: 139 mg/dL — ABNORMAL HIGH (ref 0–99)
NonHDL: 145.51
Total CHOL/HDL Ratio: 3
Triglycerides: 34 mg/dL (ref 0.0–149.0)
VLDL: 6.8 mg/dL (ref 0.0–40.0)

## 2023-01-18 LAB — COMPREHENSIVE METABOLIC PANEL
ALT: 22 U/L (ref 0–53)
AST: 20 U/L (ref 0–37)
Albumin: 4.7 g/dL (ref 3.5–5.2)
Alkaline Phosphatase: 50 U/L (ref 39–117)
BUN: 20 mg/dL (ref 6–23)
CO2: 29 meq/L (ref 19–32)
Calcium: 9.4 mg/dL (ref 8.4–10.5)
Chloride: 99 meq/L (ref 96–112)
Creatinine, Ser: 1.14 mg/dL (ref 0.40–1.50)
GFR: 65.68 mL/min (ref >60.00)
Glucose, Bld: 83 mg/dL (ref 70–99)
Potassium: 4.2 meq/L (ref 3.5–5.1)
Sodium: 136 meq/L (ref 135–145)
Total Bilirubin: 0.9 mg/dL (ref 0.2–1.2)
Total Protein: 7.1 g/dL (ref 6.0–8.3)

## 2023-01-18 LAB — HEMOGLOBIN A1C: Hgb A1c MFr Bld: 5.6 % (ref 4.6–6.5)

## 2023-01-18 LAB — TSH: TSH: 3.15 u[IU]/mL (ref 0.35–5.50)

## 2023-01-18 MED ORDER — LOSARTAN POTASSIUM 50 MG PO TABS: 50.0000 mg | ORAL_TABLET | Freq: Every day | ORAL | 1 refills | Status: DC

## 2023-01-18 NOTE — Progress Notes (Unsigned)
Subjective:  Patient ID: Eric Duran, male    DOB: Apr 13, 1955  Age: 67 y.o. MRN: [id number]  CC:  Chief Complaint  Patient presents with   Medical Management of Chronic Issues    Patient discontinued statin -discuss treatment going forward,    Referral    Pt wants to discuss his visit with Neuro, she recommended sleep study and thyroid blood work,     HPI Eric Duran presents for   Hypertension: Losartan 50 mg daily.  Have been able to the lower dosage previously with weight improvement from 100 mg to 50 mg daily.  He did have an elevated potassium on outside labs for study and question of prolonged PR interval but normal in office evaluation. Home readings: 120-129/60's.  BP Readings from Last 3 Encounters:  01/18/23 114/60  07/19/22 124/60  04/11/22 122/64   Lab Results  Component Value Date   CREATININE 1.17 07/27/2022   Hyperlipidemia: Last discussed in June.  Has been on statin for approximately 10 years, pravastatin 40 mg daily.  No family history of early CAD but his CVA and his grandmother and father with valve disease.  10-year ASCVD risk score 13.6%.  Coronary calcium scoring performed in July.  25th percentile with score of 13.3 in his LAD.  Aortic atherosclerosis noted. Mild ascending aorta dilatation at 4 cm, recommended annual imaging by CTA or MRA.  Given results recommended continuing statin treatment. Decided to stop statin since August. Some brain fog on meds. Improved off statin. Amenable to intermittent dose trial.   Lab Results  Component Value Date   CHOL 174 07/19/2022   HDL 80.80 07/19/2022   LDLCALC 86 07/19/2022   LDLDIRECT 173.8 05/05/2012   TRIG 38.0 07/19/2022   CHOLHDL 2 07/19/2022   Lab Results  Component Value Date   ALT 20 07/27/2022   AST 19 07/27/2022   ALKPHOS 46 07/27/2022   BILITOT 1.0 07/27/2022    Memory concerns See prior visits.  Family history of dementia.  Participated in a research study.  PET amyloid was negative.   Note from neurology at [facility], consult September 23 noted.Plan for establishment in clinic for follow-up of memory.  No specific cognitive concerns noted at that visit.    Mild cognitive impairment.  MMSE 29 out of 30, missed 1 on recall.  Diagnosis of mild cognitive impairment based on impairment in language.  Recommended sleep test.  Apparently had screen positive for OSA previously but not pursued testing after weight loss.  1 year follow-up planned, other recommendations regarding memory were provided from the specialist. He is ok with this plan.  Plan for thyroid test and repeat sleep testing.  Wife has noticed pauses in breathing and snoring - still noted after weight loss.   Prediabetes: Last reading in normal range - weight loss with improvement prior.  Lab Results  Component Value Date   HGBA1C 5.4 07/19/2022   Wt Readings from Last 3 Encounters:  01/18/23 184 lb 3.2 oz (83.6 kg)  07/19/22 192 lb (87.1 kg)  04/11/22 212 lb 6.4 oz (96.3 kg)      History Patient Active Problem List   Diagnosis Date Noted   Previous back surgery 11/24/2019   Hypertension 08/20/2016   Gallstones 02/22/2011   Abdominal pain, right upper quadrant 02/22/2011   Crohn's disease (HCC) 02/13/2011   Otitis media, suppurative 11/09/2009   Dyslipidemia 07/22/2009   ANEMIA, IRON DEFICIENCY 07/22/2009   Asthma 07/22/2009   GERD 07/22/2009   PREDIABETES  07/22/2009   NONSPECIFIC ABNORM RESULTS KIDNEY FUNCTION STUDY 07/22/2009   Past Medical History:  Diagnosis Date   ANEMIA, IRON DEFICIENCY 07/22/2009   ASTHMA 07/22/2009   mild   GERD 07/22/2009   HYPERLIPIDEMIA 07/22/2009   Kidney stones 2001   6 times in past   OTITIS MEDIA, SUPPURATIVE 11/09/2009   PREDIABETES 07/22/2009   pt does not monitor cbg at home   Past Surgical History:  Procedure Laterality Date   BACK SURGERY     lumbar x 2   BIOPSY BOWEL  01/30/1980   exploration bowel surgery, chrons disease   CHOLECYSTECTOMY  02/26/2011    Procedure: LAPAROSCOPIC CHOLECYSTECTOMY WITH INTRAOPERATIVE CHOLANGIOGRAM;  Surgeon: Velora Heckler, MD;  Location: [facility];  Service: General;  Laterality: N/A;   colonscopy  2012 and 2006   HERNIA REPAIR  2005 - approx   TONSILLECTOMY  1960 - approx   No Known Allergies Prior to Admission medications   Medication Sig Start Date End Date Taking? Authorizing Provider  losartan (COZAAR) 50 MG tablet Take 1 tablet (50 mg total) by mouth daily. 06/26/22  Yes Shade Flood, MD   Social History   Socioeconomic History   Marital status: Married    Spouse name: Not on file   Number of children: Not on file   Years of education: Not on file   Highest education level: Doctorate  Occupational History   Not on file  Tobacco Use   Smoking status: Never   Smokeless tobacco: Never  Vaping Use   Vaping status: Never Used  Substance and Sexual Activity   Alcohol use: Yes    Comment: occasional glass of wine   Drug use: No   Sexual activity: Yes  Other Topics Concern   Not on file  Social History Narrative   Lives at home with wife    Right handed   Caffeine: 2 cups/day   Social Drivers of Health   Financial Resource Strain: Low Risk  (01/14/2023)   Overall Financial Resource Strain (CARDIA)    Difficulty of Paying Living Expenses: Not hard at all  Food Insecurity: No Food Insecurity (01/14/2023)   Hunger Vital Sign    Worried About Running Out of Food in the Last Year: Never true    Ran Out of Food in the Last Year: Never true  Transportation Needs: No Transportation Needs (01/14/2023)   PRAPARE - Administrator, Civil Service (Medical): No    Lack of Transportation (Non-Medical): No  Physical Activity: Sufficiently Active (01/14/2023)   Exercise Vital Sign    Days of Exercise per Week: 5 days    Minutes of Exercise per Session: 60 min  Stress: No Stress Concern Present (01/14/2023)   [employer] of Occupational Health - Occupational Stress Questionnaire     Feeling of Stress : Not at all  Social Connections: Socially Integrated (01/14/2023)   Social Connection and Isolation Panel [NHANES]    Frequency of Communication with Friends and Family: More than three times a week    Frequency of Social Gatherings with Friends and Family: Twice a week    Attends Religious Services: More than 4 times per year    Active Member of [employer] or Organizations: Yes    Attends Engineer, structural: More than 4 times per year    Marital Status: Married  Catering manager Violence: Not on file    Review of Systems  Constitutional:  Negative for fatigue and unexpected weight change.  Eyes:  Negative for visual disturbance.  Respiratory:  Negative for cough, chest tightness and shortness of breath.   Cardiovascular:  Negative for chest pain, palpitations and leg swelling.  Gastrointestinal:  Negative for abdominal pain and blood in stool.  Neurological:  Negative for dizziness, light-headedness and headaches.     Objective:   Vitals:   01/18/23 1109  BP: 114/60  Pulse: (!) 55  Temp: 98.7 F (37.1 C)  TempSrc: Temporal  SpO2: 100%  Weight: 184 lb 3.2 oz (83.6 kg)  Height: 6\' 2"  (1.88 m)     Physical Exam Vitals reviewed.  Constitutional:      Appearance: He is well-developed.  HENT:     Head: Normocephalic and atraumatic.  Neck:     Vascular: No carotid bruit or JVD.  Cardiovascular:     Rate and Rhythm: Normal rate and regular rhythm.     Heart sounds: Normal heart sounds. No murmur heard. Pulmonary:     Effort: Pulmonary effort is normal.     Breath sounds: Normal breath sounds. No rales.  Musculoskeletal:     Right lower leg: No edema.     Left lower leg: No edema.  Skin:    General: Skin is warm and dry.  Neurological:     Mental Status: He is alert and oriented to person, place, and time.  Psychiatric:        Mood and Affect: Mood normal.        Assessment & Plan:  Eric Duran is a 67 y.o. male . Dyslipidemia -  Plan: Comprehensive metabolic panel, Lipid panel Atherosclerosis of aorta (HCC) - Plan: Lipid panel  -Some possible side effects with daily dosing of statin as above.  Recommended intermittent dosing as trial given underlying atherosclerosis, coronary calcium noted within the LAD.  Long-term risk of hyperlipidemia with CVD, CAD discussed versus side effects/risk of statins.  Updated lab work requested, check labs above.  MCI (mild cognitive impairment) - Plan: TSH Family history of dementia  -Visit with neurology as above.  Mild impairment, language, no new meds at this time.  Check TSH.  Continue follow-up with neurology as planned and monitor for any new memory symptoms.  Primary hypertension - Plan: losartan (COZAAR) 50 MG tablet, TSH  -Stable on current dose losartan, check labs as above, continue same regimen.  PREDIABETES - Plan: Hemoglobin A1c  -History of prediabetes but improved with weight changes, diet changes, check updated levels.  Aortic dilatation (HCC) - Plan: CT Angio Chest W/Cm &/Or Wo Cm  -Repeat CT ordered for 1 year follow-up.  Snoring - Plan: Ambulatory referral to Sleep Studies  -History of snoring and with recommendations from neurology will order sleep specialist evaluation locally.  Meds ordered this encounter  Medications   losartan (COZAAR) 50 MG tablet    Sig: Take 1 tablet (50 mg total) by mouth daily.    Dispense:  90 tablet    Refill:  1   Patient Instructions  Based on prior testing with coronary calcium score and aortic atherosclerosis, I would recommend some statin treatment. You can try intermittent dosing to see if that is tolerated better- start a few days per week initially.  I have ordered the repeat CT scan for July of this year for the dilation of the aorta.  We can discuss those results at your follow-up visit. No med changes at this time. If any concerns on labs I will let you know.  I did check a thyroid test today and referred  you to sleep  specialist.  Let me know if there are any questions and take care.     Signed,   Meredith Staggers, MD  Primary Care, [facility] 01/18/23 11:46 AM

## 2023-01-18 NOTE — Patient Instructions (Addendum)
Based on prior testing with coronary calcium score and aortic atherosclerosis, I would recommend some statin treatment. You can try intermittent dosing to see if that is tolerated better- start a few days per week initially.  I have ordered the repeat CT scan for July of this year for the dilation of the aorta.  We can discuss those results at your follow-up visit. No med changes at this time. If any concerns on labs I will let you know.  I did check a thyroid test today and referred you to sleep specialist.  Let me know if there are any questions and take care.

## 2023-01-19 ENCOUNTER — Encounter: Payer: Self-pay | Admitting: Family Medicine

## 2023-02-04 ENCOUNTER — Encounter: Payer: Self-pay | Admitting: Family Medicine

## 2023-02-04 DIAGNOSIS — E785 Hyperlipidemia, unspecified: Secondary | ICD-10-CM

## 2023-02-04 MED ORDER — PRAVASTATIN SODIUM 40 MG PO TABS: 40.0000 mg | ORAL_TABLET | Freq: Every day | ORAL | 0 refills | Status: DC

## 2023-02-04 NOTE — Telephone Encounter (Signed)
 Do not see Pravastatin in the active medication list. Please advise

## 2023-02-04 NOTE — Addendum Note (Signed)
 Addended by: Meredith Staggers R on: 02/04/2023 06:12 PM   Modules accepted: Orders

## 2023-02-18 ENCOUNTER — Ambulatory Visit (INDEPENDENT_AMBULATORY_CARE_PROVIDER_SITE_OTHER): Payer: Medicare Other | Admitting: Neurology

## 2023-02-18 ENCOUNTER — Encounter: Payer: Self-pay | Admitting: Neurology

## 2023-02-18 VITALS — BP 132/75 | HR 55 | Ht 74.0 in | Wt 191.0 lb

## 2023-02-18 DIAGNOSIS — G4719 Other hypersomnia: Secondary | ICD-10-CM | POA: Diagnosis not present

## 2023-02-18 DIAGNOSIS — R0683 Snoring: Secondary | ICD-10-CM | POA: Diagnosis not present

## 2023-02-18 DIAGNOSIS — D8989 Other specified disorders involving the immune mechanism, not elsewhere classified: Secondary | ICD-10-CM | POA: Diagnosis not present

## 2023-02-18 DIAGNOSIS — R0681 Apnea, not elsewhere classified: Secondary | ICD-10-CM

## 2023-02-18 NOTE — Patient Instructions (Addendum)
Sleep Apnea Test: What to Expect  Sleep apnea is a condition that affects your breathing while you're sleeping. You may have shallow breathing or stop breathing for short periods of time. Sleep apnea screening is a test to check if you're at risk for sleep apnea. The test includes questions. It will only takes a few minutes. Your health care provider may ask you to have this test before a surgery or as part of a physical exam. What are the symptoms of sleep apnea? Snoring. Waking up often at night. Daytime sleepiness. Pauses in breathing. Choking or gasping during sleep. Being annoyed easily. Forgetfulness. Trouble thinking clearly. Depression. Personality changes. Headaches in the morning. Most people with sleep apnea do not know that they have it. What are the advantages of sleep apnea screening? Getting screened for sleep apnea can help: Keep you safer. Your providers need to know whether or not you have sleep apnea, especially if you're having surgery or have other long-term, or chronic, health conditions. Improve your health and help you get a better night's rest. Restful sleep can help you: Have more energy. Lose weight. Improve high blood pressure. Improve diabetes management. Prevent stroke. Prevent car accidents. What happens before the screening? You may talk with your provider about the screening and what other tests may be recommended based on the screening. What happens during the screening? Screening usually includes being asked a list of questions about your sleep quality. Some questions you may be asked include: Do you snore? Is your sleep restless? Do you have daytime sleepiness? Has a partner or spouse told you that you stop breathing, choke, or gasp during sleep? Have you had trouble concentrating or memory loss? What is your age? What is your neck circumference? To measure your neck, keep your back straight and gently wrap the tape measure around your neck.  Put the tape measure at the middle of your neck, between your chin and collarbone. What is your sex assigned at birth? Do you have high blood pressure or are you being treated for high blood pressure? If your screening test is positive, you're at risk for the condition. More tests may be needed to confirm a diagnosis of sleep apnea. What can I expect after the screening? Your provider will go over the results of the screening with you and make recommendations based on the results of the test. Where to find more information You can find screening tools online or at your health care clinic. To learn more, go to these websites: Centers for Disease Control and Prevention: DiningCalendar.de. Then: Click Health Topics A-Z. Type "sleep apnea" in the search box. National Heart, Lung, and Blood Institute: BuffaloDryCleaner.gl Contact a health care provider if: You think that you may have sleep apnea. This information is not intended to replace advice given to you by your health care provider. Make sure you discuss any questions you have with your health care provider. Document Revised: 06/23/2022 Document Reviewed: 06/23/2022 Elsevier Patient Education  2024 Elsevier Inc.       ASSESSMENT AND PLAN 68 y.o. year old male  Patient here with:   Excessive daytime sleepiness:  Fatigue:  Snoring/ OSA.      1) limited exercise tolerance due to back problems.  Continues to exercise, made life style changes, diet changes.  Exposure to daylight daily/ 6 days a week, sleep and daytime routines.    2) snoring , and witnessed apneas, helped by weight loss but not alleviated.  Weight loss of 40 pounds is  significant , achieved without medication.     Plan :  Patient needs a HST for apnea screening.

## 2023-02-18 NOTE — Progress Notes (Signed)
SLEEP MEDICINE CLINIC    Provider:  Melvyn Novas, MD  Primary Care Physician:  Shade Flood, MD [address] [place] [address]     Referring Provider: Shade Flood, Md [address] [place],  [place] [address]          Chief Complaint according to patient   Patient presents with:     New SLEEP Patient (Initial Visit)           HISTORY OF PRESENT ILLNESS:  Eric Duran is a 68 y.o. male patient who is seen upon referral on 02/18/2023 from Dr Neva Seat for a sleep consult. .  Chief concern according to patient : " I lost 40 pounds by diet , exercise, lifestyle changes and  I am not snoring as loudly anymore, still having apneas. "   I have the pleasure of seeing Eric Duran 02/18/23 a right -handed male with a possible sleep disorder.     Sleep relevant medical history: Nocturia  0-2 , Tonsillectomy in childhood, adenoids , neck injury 1985 cervical spine injury- had lumbar surgery 2020,  no major dental surgery, retainer as an adult.  He has been struggling with back problems and with [facility] , followed somewhere else. Crohns disease, hernia and  gallbladder . GERD and asthma in the past   Family medical /sleep history: thyroid disease in his brother , brother and mother  on CPAP with OSA,  mother with dementia as was MGF>    Social history:  Patient is retired from Catering manager work , about 5 years ago- and lives in a household with wife . Family status is married , with  3 adult children, several  grandchildren.  No pets.  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   Tobacco use: none .  ETOH use : rare- 3/ week end,  Caffeine intake in form of Coffee( 2/ in AM ) Soda( /) Tea ( /). No energy drinks Exercise in form of walking, .         Sleep habits are as follows: The patient's dinner time is between 5-5.30  PM. The patient goes to bed at  8.30 PM and continues to sleep for 8.5  hours, wakes for 0-2  bathroom breaks, wakes sometimes form back and hip pain. This dictates the sleep position : supine . Marland Kitchen   The preferred sleep position is lateral, with the support of 1 pillow, flat surface.   Dreams are reportedly frequent/vivid.   The patient wakes up spontaneously by 4 AM- 5.30  AM is the usual rise time. He reports feeling refreshed or restored in  early AM, with symptoms such as dry mouth, morning headaches, and residual fatigue.  Naps are taken infrequently, falling asleep unintentionally-  after lunch, while reading , lasting from 10 to 20 minutes and are refreshing.    Review of Systems: Out of a complete 14 system review, the patient complains of only the following symptoms, and all other reviewed systems are negative.:  Fatigue, sleepiness , snoring, fragmented sleep, Insomnia,     How likely are you to doze in the following situations: 0 = not likely, 1 = slight chance, 2 = moderate chance, 3 = high chance   Sitting and Reading? Watching Television? Sitting inactive in a public place (theater or meeting)? As a passenger in a car for an hour without a break? Lying down in  the afternoon when circumstances permit? Sitting and talking to someone? Sitting quietly after lunch without alcohol? In a car, while stopped for a few minutes in traffic?   Total =  12 / 24 points   FSS endorsed at 31/ 63 points.   GDS : NA  Social History   Socioeconomic History   Marital status: Married    Spouse name: Not on file   Number of children: Not on file   Years of education: Not on file   Highest education level: Doctorate  Occupational History   Not on file  Tobacco Use   Smoking status: Never   Smokeless tobacco: Never  Vaping Use   Vaping status: Never Used  Substance and Sexual Activity   Alcohol use: Yes    Comment: occasional glass of wine   Drug use: No   Sexual activity: Yes  Other Topics Concern   Not on file  Social History Narrative   Lives at home with wife     Right handed   Caffeine: 2 cups/day   Social Drivers of Corporate investment banker Strain: Low Risk  (01/14/2023)   Overall Financial Resource Strain (CARDIA)    Difficulty of Paying Living Expenses: Not hard at all  Food Insecurity: No Food Insecurity (01/14/2023)   Hunger Vital Sign    Worried About Running Out of Food in the Last Year: Never true    Ran Out of Food in the Last Year: Never true  Transportation Needs: No Transportation Needs (01/14/2023)   PRAPARE - Administrator, Civil Service (Medical): No    Lack of Transportation (Non-Medical): No  Physical Activity: Sufficiently Active (01/14/2023)   Exercise Vital Sign    Days of Exercise per Week: 5 days    Minutes of Exercise per Session: 60 min  Stress: No Stress Concern Present (01/14/2023)   [employer] of Occupational Health - Occupational Stress Questionnaire    Feeling of Stress : Not at all  Social Connections: Socially Integrated (01/14/2023)   Social Connection and Isolation Panel [NHANES]    Frequency of Communication with Friends and Family: More than three times a week    Frequency of Social Gatherings with Friends and Family: Twice a week    Attends Religious Services: More than 4 times per year    Active Member of [employer] or Organizations: Yes    Attends Engineer, structural: More than 4 times per year    Marital Status: Married    Family History  Problem Relation Age of Onset   Sleep apnea Mother    Cancer Father        colon   Sleep apnea Brother    Hypertension Maternal Grandfather    Stroke Maternal Grandfather    Pseudotumor cerebri Son     Past Medical History:  Diagnosis Date   ANEMIA, IRON DEFICIENCY 07/22/2009   ASTHMA 07/22/2009   mild   GERD 07/22/2009   HYPERLIPIDEMIA 07/22/2009   Kidney stones 2001   6 times in past   OTITIS MEDIA, SUPPURATIVE 11/09/2009   PREDIABETES 07/22/2009   pt does not monitor cbg at home    Past Surgical History:  Procedure  Laterality Date   BACK SURGERY     lumbar x 2   BIOPSY BOWEL  01/30/1980   exploration bowel surgery, chrons disease   CHOLECYSTECTOMY  02/26/2011   Procedure: LAPAROSCOPIC CHOLECYSTECTOMY WITH INTRAOPERATIVE CHOLANGIOGRAM;  Surgeon: Velora Heckler, MD;  Location: [facility];  Service:  General;  Laterality: N/A;   colonscopy  2012 and 2006   HERNIA REPAIR  2005 - approx   TONSILLECTOMY  1960 - approx     Current Outpatient Medications on File Prior to Visit  Medication Sig Dispense Refill   losartan (COZAAR) 50 MG tablet Take 1 tablet (50 mg total) by mouth daily. 90 tablet 1   pravastatin (PRAVACHOL) 40 MG tablet Take 1 tablet (40 mg total) by mouth daily. Can start with intermittent dosing, increase to daily as tolerated. 90 tablet 0   No current facility-administered medications on file prior to visit.    No Known Allergies   DIAGNOSTIC DATA (LABS, IMAGING, TESTING) - I reviewed patient records, labs, notes, testing and imaging myself where available.  Lab Results  Component Value Date   WBC 5.5 10/23/2017   HGB 13.8 10/23/2017   HCT 40.0 10/23/2017   MCV 84.6 10/23/2017   PLT 214.0 10/23/2017      Component Value Date/Time   NA 136 01/18/2023 1220   K 4.2 01/18/2023 1220   CL 99 01/18/2023 1220   CO2 29 01/18/2023 1220   GLUCOSE 83 01/18/2023 1220   BUN 20 01/18/2023 1220   CREATININE 1.14 01/18/2023 1220   CALCIUM 9.4 01/18/2023 1220   PROT 7.1 01/18/2023 1220   ALBUMIN 4.7 01/18/2023 1220   AST 20 01/18/2023 1220   ALT 22 01/18/2023 1220   ALKPHOS 50 01/18/2023 1220   BILITOT 0.9 01/18/2023 1220   GFRNONAA 67.24 07/26/2009 0807   Lab Results  Component Value Date   CHOL 235 (H) 01/18/2023   HDL 89.30 01/18/2023   LDLCALC 139 (H) 01/18/2023   LDLDIRECT 173.8 05/05/2012   TRIG 34.0 01/18/2023   CHOLHDL 3 01/18/2023   Lab Results  Component Value Date   HGBA1C 5.6 01/18/2023   Lab Results  Component Value Date   VITAMINB12 901 07/19/2022   Lab Results   Component Value Date   TSH 3.15 01/18/2023    PHYSICAL EXAM:  Today's Vitals   02/18/23 1058  BP: 132/75  Pulse: (!) 55  Weight: 191 lb (86.6 kg)  Height: 6\' 2"  (1.88 m)   Body mass index is 24.52 kg/m.   Wt Readings from Last 3 Encounters:  02/18/23 191 lb (86.6 kg)  01/18/23 184 lb 3.2 oz (83.6 kg)  07/19/22 192 lb (87.1 kg)     Ht Readings from Last 3 Encounters:  02/18/23 6\' 2"  (1.88 m)  01/18/23 6\' 2"  (1.88 m)  07/19/22 6\' 2"  (1.88 m)      General: The patient is awake, alert and appears not in acute distress. The patient is well groomed. Head: Normocephalic, atraumatic. Neck is supple.  Mallampati 1,  neck circumference:15 inches .  Nasal airflow  patent.   Retrognathia is seen.  Overbite , crowded teeth  Dental status: lower jaw irregular  Cardiovascular:  Regular rate and cardiac rhythm by pulse,  without distended neck veins. Respiratory: Lungs are clear to auscultation.  Skin:  Without evidence of  right side ankle edema, ever since  lumbar surgery.  Trunk: The patient's posture is erect.   NEUROLOGIC EXAM: The patient is awake and alert, oriented to place and time.   Memory subjective described as intact.  Attention span & concentration ability appears normal.  Speech is fluent,  without dysarthria, dysphonia or aphasia.  Mood and affect are appropriate.   Cranial nerves: no loss of smell or taste reported  Pupils are equal and briskly reactive to light.  Funduscopic exam def..  Extraocular movements in vertical and horizontal planes were intact and without nystagmus. No Diplopia. Visual fields by finger perimetry are intact. Hearing was impaired , even with hearing aid.  Facial sensation intact to fine touch.  Facial motor strength is symmetric and tongue and uvula move midline.  Neck ROM : rotation, tilt and flexion extension were normal for age and shoulder shrug was symmetrical.    Motor exam:  Symmetric bulk, tone and ROM.   Normal tone  without cog- wheeling, symmetric grip strength .   Sensory:  Fine touch, pinprick and vibration were tested  and  slightly decreased over the right ankle.  Proprioception tested in the upper extremities was normal.   Coordination: Rapid alternating movements in the fingers/hands were of normal speed.  The Finger-to-nose maneuver was intact without evidence of ataxia, dysmetria or tremor.   Gait and station: Patient could rise unassisted from a seated position, walked without assistive device.  Stance is of normal width/ base .  Toe and heel walk were deferred.  Deep tendon reflexes: in the  upper and lower extremities are symmetric and intact.  Babinski response was deferred .    ASSESSMENT AND PLAN 68 y.o. year old male  Patient here with:  Excessive daytime sleepiness:     1) limited exercise tolerance due to back problems.  Continues to exercise, made life style changes, diet changes.  Exposure to daylight daily/ 6 days a week, sleep and daytime routines.   2) snoring , and witnessed apneas, helped by weight loss but not alleviated.  Weight loss of 40 pounds.   Plan :  Patient needs a HST for apnea screening.      I plan to follow up either personally or through our NP within 3-5 months.   I would like to thank Shade Flood, MD and Shade Flood, Md [address] [place],  [place] [address] for allowing me to meet with and to take care of this pleasant patient.    After spending a total time of  35  minutes face to face and additional time for physical and neurologic examination, review of laboratory studies,  personal review of imaging studies, reports and results of other testing and review of referral information / records as far as provided in visit,   Electronically signed by: Melvyn Novas, MD 02/18/2023 11:15 AM  [facility] Board certified by The [employer] of Sleep Medicine and Diplomate of the [employer] of Sleep Medicine. Board certified In Neurology through the ABPN, Fellow of the [employer] of Neurology.

## 2023-02-20 ENCOUNTER — Telehealth: Payer: Self-pay | Admitting: Family Medicine

## 2023-02-20 DIAGNOSIS — I700 Atherosclerosis of aorta: Secondary | ICD-10-CM

## 2023-02-20 DIAGNOSIS — I77819 Aortic ectasia, unspecified site: Secondary | ICD-10-CM

## 2023-02-20 NOTE — Telephone Encounter (Signed)
New order placed for CT angio aorta, please cancel other study.  Let me know if I need to change this further.

## 2023-02-20 NOTE — Telephone Encounter (Signed)
Request for you to change the order for the patient

## 2023-02-20 NOTE — Telephone Encounter (Unsigned)
Copied from CRM [id number]. Topic: Clinical - Request for Lab/Test Order >> Feb 20, 2023  1:24 PM Samuel Jester B wrote: Reason for CRM: Cathlean Cower called from [facility] imaging and stated that an order was sent over for a CT chest pulminary Imbolism and the order should have been for a CTANGO for A-orda. Callback number is [phone] ext [phone] or option 1 then option 4

## 2023-02-21 NOTE — Telephone Encounter (Signed)
Canceled order from original date, called Imaging per the requested call back number

## 2023-02-21 NOTE — Addendum Note (Signed)
Addended by: Eldred Manges on: 02/21/2023 09:04 AM   Modules accepted: Orders

## 2023-02-28 ENCOUNTER — Telehealth: Payer: Self-pay | Admitting: Neurology

## 2023-02-28 NOTE — Telephone Encounter (Signed)
HST- Medicare/BCBS supp no auth req   Patient is scheduled at [facility] for 03/09/23 at 2:30 pm.  Mailed packet to the patient.

## 2023-03-10 ENCOUNTER — Encounter: Payer: Self-pay | Admitting: Family Medicine

## 2023-03-19 ENCOUNTER — Ambulatory Visit (INDEPENDENT_AMBULATORY_CARE_PROVIDER_SITE_OTHER): Payer: Medicare Other | Admitting: Neurology

## 2023-03-19 DIAGNOSIS — D8989 Other specified disorders involving the immune mechanism, not elsewhere classified: Secondary | ICD-10-CM

## 2023-03-19 DIAGNOSIS — G4733 Obstructive sleep apnea (adult) (pediatric): Secondary | ICD-10-CM

## 2023-03-19 DIAGNOSIS — G4719 Other hypersomnia: Secondary | ICD-10-CM

## 2023-03-19 DIAGNOSIS — R0683 Snoring: Secondary | ICD-10-CM

## 2023-03-19 DIAGNOSIS — R0681 Apnea, not elsewhere classified: Secondary | ICD-10-CM

## 2023-03-22 NOTE — Progress Notes (Signed)
 [facility]  Eric Duran 68 year old male 09-Aug-1955   HOME SLEEP TEST REPORT ( by Watch PAT)   STUDY DATE:  03-19-2023- data load on 03-23-2023   ORDERING CLINICIAN: Melvyn Novas, MD  REFERRING CLINICIAN: Dr Neva Seat, MD PCP   CLINICAL INFORMATION/HISTORY: Eric Duran is a 68 y.o. male patient who is seen upon referral on 02/18/2023 from Dr Neva Seat for a sleep consult.  Chief concern according to patient : "Excessive daytime sleepiness:   -  I lost 40 pounds by diet , exercise, lifestyle changes and  I am not snoring as loudly anymore, but still having apneas. "  Nocturia  0-2 , Tonsillectomy in childhood, adenoids , neck injury 1985 cervical spine injury- had lumbar surgery 2020,  no major dental surgery,  wore  dental retainer as an adult.  He has been struggling with back problems and with [facility] , followed somewhere else. Crohn's disease, hernia and gallbladder surgery, GERD and Asthma I.  Fam Hx: thyroid disease in his brother , his brother and mother are/ were  on CPAP for OSA,  mother with dementia , MGF with dementia".   Epworth sleepiness score:12 / 24 points   FSS endorsed at 31/ 63 points.      BMI:24.5  kg/m   Neck Circumference: 15"   FINDINGS:   Sleep Summary:   Total Recording Time (hours, min): 9 hours 40 minutes       Total Sleep Time (hours, min):      8 hours 22 minutes           Percent REM (%):       22.4%       Sleep latency was 20 minutes long, REM sleep latency 66 minutes and there were 32 minutes of wakefulness after sleep onset.                            Respiratory Indices:   Calculated pAHI (per hour):     Applying CMS criteria his AHI is 6.9/h which constitutes very mild and all obstructive sleep apnea.                        REM pAHI:        10.8/h                                         NREM pAHI:    6.0/h                          Positional AHI:      The patient slept 302 minutes and none supine sleep positions  right and left-sided and here the AHI was 0.0 for the right and 0.4/h for the left side.  This is in stark contrast to supine sleep which was recorded for 200 minutes.  Here the AHI was 17.1/h.  Snoring level had a mean volume of 40 dB and this is just the threshold of detection, it was present for only 11% of total sleep time  Oxygen Saturation Statistics:   Oxygen Saturation (%) Mean:   95%              O2 Saturation Range (%):     Between a nadir at 87% with a maximum saturation of 99%.                                  O2 Saturation (minutes) <89%:     0 minutes      Pulse Rate Statistics:   Pulse Mean (bpm):     46 bpm, this is bradycardia at baseline            Pulse Range:    Between 36 and 72 bpm.             IMPRESSION:  This HST confirms the presence of very mild obstructive sleep apnea using the CMS criteria.  The AHI was 6.9/h total clearly accentuated by REM sleep but not REM sleep dependent.  What was seen is that this is strictly positional dependent apnea is no sleep apnea was seen when sleeping in lateral left or right position.  I would recommend for this patient to avoid supine sleep this can be retrained by using the tennis ball method there are also devices that are wearable  and will alert the sleeper to change positions -they vibrate.  I was quite intrigued with the low heart rate seen during this recording.  There was a little bit more pulse rate variability seen during REM sleep but the patient is bradycardic at baseline.  Bradycardia can be medication induced, and can promote feelings of fatigue or sleepiness.   RECOMMENDATION: I would strictly advised this patient to avoid supine sleep I do not think that he needs CPAP based on these results,  he certainly would not be a candidate for an implanted hypoglossal nerve stimulator.  As the patient has reported snoring that may be at a higher degree than what we captured during  the night of the home sleep test - he does have the option of using a dental device and with the underlying condition of obstructive sleep apnea his medical insurance should be the payor for the device.  I will offer him a referral to a sleep dentist if he so desires. A mandibular advancement device can reduce snoring as long as there is a good nasal airway patency and can reduce non-REM sleep apneas.  Last, not least.  I patient with persistent feeling of tiredness sleepiness and fatigue may still benefit from CPAP use if he wants to give this a trial.   My consideration here is that he may not be able for orthopedic reasons to sleep on his sides  and if this is not possible then CPAP would be a Plan B or plan C option.      INTERPRETING PHYSICIAN:   Melvyn Novas, MD

## 2023-03-26 ENCOUNTER — Encounter: Payer: Self-pay | Admitting: Neurology

## 2023-03-26 DIAGNOSIS — G4719 Other hypersomnia: Secondary | ICD-10-CM

## 2023-03-26 DIAGNOSIS — R0681 Apnea, not elsewhere classified: Secondary | ICD-10-CM | POA: Insufficient documentation

## 2023-03-26 DIAGNOSIS — R0683 Snoring: Secondary | ICD-10-CM

## 2023-03-26 NOTE — Procedures (Signed)
 [facility]  Eric Duran 68 year old male 09-Aug-1955   HOME SLEEP TEST REPORT ( by Watch PAT)   STUDY DATE:  03-19-2023- data load on 03-23-2023   ORDERING CLINICIAN: Melvyn Novas, MD  REFERRING CLINICIAN: Dr Neva Seat, MD PCP   CLINICAL INFORMATION/HISTORY: Eric Duran is a 68 y.o. male patient who is seen upon referral on 02/18/2023 from Dr Neva Seat for a sleep consult.  Chief concern according to patient : "Excessive daytime sleepiness:   -  I lost 40 pounds by diet , exercise, lifestyle changes and  I am not snoring as loudly anymore, but still having apneas. "  Nocturia  0-2 , Tonsillectomy in childhood, adenoids , neck injury 1985 cervical spine injury- had lumbar surgery 2020,  no major dental surgery,  wore  dental retainer as an adult.  He has been struggling with back problems and with MCI , followed somewhere else. Crohn's disease, hernia and gallbladder surgery, GERD and Asthma I.  Fam Hx: thyroid disease in his brother , his brother and mother are/ were  on CPAP for OSA,  mother with dementia , MGF with dementia".   Epworth sleepiness score:12 / 24 points   FSS endorsed at 31/ 63 points.      BMI:24.5  kg/m   Neck Circumference: 15"   FINDINGS:   Sleep Summary:   Total Recording Time (hours, min): 9 hours 40 minutes       Total Sleep Time (hours, min):      8 hours 22 minutes           Percent REM (%):       22.4%       Sleep latency was 20 minutes long, REM sleep latency 66 minutes and there were 32 minutes of wakefulness after sleep onset.                            Respiratory Indices:   Calculated pAHI (per hour):     Applying CMS criteria his AHI is 6.9/h which constitutes very mild and all obstructive sleep apnea.                        REM pAHI:        10.8/h                                         NREM pAHI:    6.0/h                          Positional AHI:      The patient slept 302 minutes and none supine sleep positions  right and left-sided and here the AHI was 0.0 for the right and 0.4/h for the left side.  This is in stark contrast to supine sleep which was recorded for 200 minutes.  Here the AHI was 17.1/h.  Snoring level had a mean volume of 40 dB and this is just the threshold of detection, it was present for only 11% of total sleep time  Oxygen Saturation Statistics:   Oxygen Saturation (%) Mean:   95%              O2 Saturation Range (%):     Between a nadir at 87% with a maximum saturation of 99%.                                  O2 Saturation (minutes) <89%:     0 minutes      Pulse Rate Statistics:   Pulse Mean (bpm):     46 bpm, this is bradycardia at baseline            Pulse Range:    Between 36 and 72 bpm.             IMPRESSION:  This HST confirms the presence of very mild obstructive sleep apnea using the CMS criteria.  The AHI was 6.9/h total clearly accentuated by REM sleep but not REM sleep dependent.  What was seen is that this is strictly positional dependent apnea is no sleep apnea was seen when sleeping in lateral left or right position.  I would recommend for this patient to avoid supine sleep this can be retrained by using the tennis ball method there are also devices that are wearable  and will alert the sleeper to change positions -they vibrate.  I was quite intrigued with the low heart rate seen during this recording.  There was a little bit more pulse rate variability seen during REM sleep but the patient is bradycardic at baseline.  Bradycardia can be medication induced, and can promote feelings of fatigue or sleepiness.   RECOMMENDATION: I would strictly advised this patient to avoid supine sleep I do not think that he needs CPAP based on these results,  he certainly would not be a candidate for an implanted hypoglossal nerve stimulator.  As the patient has reported snoring that may be at a higher degree than what we captured during  the night of the home sleep test - he does have the option of using a dental device and with the underlying condition of obstructive sleep apnea his medical insurance should be the payor for the device.  I will offer him a referral to a sleep dentist if he so desires. A mandibular advancement device can reduce snoring as long as there is a good nasal airway patency and can reduce non-REM sleep apneas.  Last, not least.  I patient with persistent feeling of tiredness sleepiness and fatigue may still benefit from CPAP use if he wants to give this a trial.   My consideration here is that he may not be able for orthopedic reasons to sleep on his sides  and if this is not possible then CPAP would be a Plan B or plan C option.      INTERPRETING PHYSICIAN:   Melvyn Novas, MD

## 2023-03-27 NOTE — Telephone Encounter (Signed)
 Called pt and LVM for him to call us back for his Sleep study results.

## 2023-03-28 ENCOUNTER — Telehealth: Payer: Self-pay

## 2023-03-28 NOTE — Addendum Note (Signed)
 Addended by: Judi Cong on: 03/28/2023 03:08 PM   Modules accepted: Orders

## 2023-03-28 NOTE — Telephone Encounter (Signed)
 Referral for dental device sent to [facility] (P) [phone]

## 2023-04-30 ENCOUNTER — Other Ambulatory Visit: Payer: Self-pay | Admitting: Family Medicine

## 2023-04-30 DIAGNOSIS — E785 Hyperlipidemia, unspecified: Secondary | ICD-10-CM

## 2023-07-22 ENCOUNTER — Ambulatory Visit (HOSPITAL_COMMUNITY)
Admission: RE | Admit: 2023-07-22 | Discharge: 2023-07-22 | Disposition: A | Discharge status: Home / Self Care | Payer: Medicare Other | Source: Ambulatory Visit | Attending: Family Medicine | Admitting: Family Medicine

## 2023-07-22 DIAGNOSIS — I700 Atherosclerosis of aorta: Secondary | ICD-10-CM | POA: Diagnosis present

## 2023-07-22 DIAGNOSIS — I77819 Aortic ectasia, unspecified site: Secondary | ICD-10-CM | POA: Diagnosis present

## 2023-07-22 LAB — POCT I-STAT CREATININE: Creatinine, Ser: 1.2 mg/dL (ref 0.61–1.24)

## 2023-07-22 MED ORDER — IOHEXOL 350 MG/ML SOLN
80.0000 mL | Freq: Once | INTRAVENOUS | Status: AC | PRN
  Administered 2023-07-22: 80 mL via INTRAVENOUS

## 2023-07-28 ENCOUNTER — Other Ambulatory Visit: Payer: Self-pay | Admitting: Family Medicine

## 2023-07-28 DIAGNOSIS — E785 Hyperlipidemia, unspecified: Secondary | ICD-10-CM

## 2023-07-30 ENCOUNTER — Ambulatory Visit: Payer: Self-pay | Admitting: Family Medicine

## 2023-08-22 ENCOUNTER — Encounter: Payer: Self-pay | Admitting: Family Medicine

## 2023-08-22 ENCOUNTER — Ambulatory Visit: Payer: Medicare Other | Admitting: Family Medicine

## 2023-08-22 VITALS — BP 110/60 | HR 58 | Temp 98.0°F | Resp 15 | Ht 73.0 in | Wt 180.4 lb

## 2023-08-22 DIAGNOSIS — R7309 Other abnormal glucose: Secondary | ICD-10-CM | POA: Diagnosis not present

## 2023-08-22 DIAGNOSIS — I10 Essential (primary) hypertension: Secondary | ICD-10-CM

## 2023-08-22 DIAGNOSIS — I700 Atherosclerosis of aorta: Secondary | ICD-10-CM

## 2023-08-22 DIAGNOSIS — Z125 Encounter for screening for malignant neoplasm of prostate: Secondary | ICD-10-CM

## 2023-08-22 DIAGNOSIS — I77819 Aortic ectasia, unspecified site: Secondary | ICD-10-CM

## 2023-08-22 DIAGNOSIS — Z1211 Encounter for screening for malignant neoplasm of colon: Secondary | ICD-10-CM

## 2023-08-22 DIAGNOSIS — G3184 Mild cognitive impairment, so stated: Secondary | ICD-10-CM

## 2023-08-22 DIAGNOSIS — E785 Hyperlipidemia, unspecified: Secondary | ICD-10-CM | POA: Diagnosis not present

## 2023-08-22 MED ORDER — LOSARTAN POTASSIUM 50 MG PO TABS: 50.0000 mg | ORAL_TABLET | Freq: Every day | ORAL | 1 refills | Status: DC

## 2023-08-22 NOTE — Progress Notes (Signed)
 Subjective:  Patient ID: Eric Duran, male    DOB: 03-Jun-1955  Age: 68 y.o. MRN: [id number]  CC:  Chief Complaint  Patient presents with   Hyperlipidemia   Hypertension    HPI Eric Duran presents for med review.  no concerns or health changes.   PCP, me Sleep specialist, Dr. Chalice, sleep study in February indicated very mild OSA with snoring and supine sleep dependent form of apnea, REM sleep exacerbated apnea as well.  Recommended to avoid supine sleep, dental device as an option or CPAP.  Was referred to dentistry for dental device. Decided against device. Sleeping ok, usually sleeping on side - option of bolster discussed. Rested.  Geriatric medicine, Dr. Valdivieso, evaluated in July 2025.  Memory clinic at [facility].  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  PET amyloid was negative.   No new meds, 1 year follow-up planne -has appt.    Hypertension: losartan  50 mg daily, and able to lower dose previously with weight improvement. No side effects with meds.  Home readings: none.  BP Readings from Last 3 Encounters:  08/22/23 110/60  02/18/23 132/75  01/18/23 114/60   Lab Results  Component Value Date   CREATININE 1.20 07/22/2023   Hyperlipidemia: Pravastatin  40 mg daily.  No new myalgias.  Coronary calcium scoring in July of last year with 25th percentile, score of 13.3 in the LAD and aortic atherosclerosis noted.  Mild ascending aorta dilatation of 4 cm with annual imaging by CTA or MRI recommended.  Continue with statin based on these results as above.  He did have some possible brain fog on meds, intermittent statin dosing had been discussed at his December visit. No change in symptoms off statin, back on daily past few months.   CT angio chest June 23 with stable aortic aneurysm, annual imaging planned.  Small right adrenal adenoma but no specific follow-up necessary, aortic atherosclerosis noted.  Lab Results  Component  Value Date   CHOL 235 (H) 01/18/2023   HDL 89.30 01/18/2023   LDLCALC 139 (H) 01/18/2023   LDLDIRECT 173.8 05/05/2012   TRIG 34.0 01/18/2023   CHOLHDL 3 01/18/2023   Lab Results  Component Value Date   ALT 22 01/18/2023   AST 20 01/18/2023   ALKPHOS 50 01/18/2023   BILITOT 0.9 01/18/2023   Borderline A1c of 5.7 in December 2023, but normal since that time at 5.4, 5.6.      08/22/2023    9:35 AM 07/19/2022   10:52 AM 05/09/2022   11:47 AM 01/15/2022   11:19 AM 06/13/2021    8:20 AM  Depression screen PHQ 2/9  Decreased Interest 0 0 0 0 0  Down, Depressed, Hopeless 0 0 0 0 0  PHQ - 2 Score 0 0 0 0 0  Altered sleeping 0 0  0   Tired, decreased energy 0 0 0 1   Change in appetite 0 0 0 1   Feeling bad or failure about yourself  0 0 0 0   Trouble concentrating 0 0 0 0   Moving slowly or fidgety/restless 0 0 0 0   Suicidal thoughts 0 0 0 0   PHQ-9 Score 0 0  2   Difficult doing work/chores Not difficult at all  Not difficult at all      Health Maintenance  Topic Date Due   DTaP/Tdap/Td (2 - Td or Tdap) 05/06/2022   COVID-19 Vaccine (5 - 2024-25 season) 09/30/2022  Medicare Annual Wellness (AWV)  05/09/2023   INFLUENZA VACCINE  08/30/2023   Colonoscopy  11/30/2029   Pneumococcal Vaccine: 50+ Years  Completed   Hepatitis C Screening  Completed   Zoster Vaccines- Shingrix   Completed   Hepatitis B Vaccines  Aged Out   HPV VACCINES  Aged Out   Meningococcal B Vaccine  Aged Out  Colonoscopy 12/01/2019, multiple polyps removed with repeat planned in 3 years. Prostate: does not have family history of prostate cancer The natural history of prostate cancer and ongoing controversy regarding screening and potential treatment outcomes of prostate cancer has been discussed with the patient. The meaning of a false positive PSA and a false negative PSA has been discussed. He indicates understanding of the limitations of this screening test and wishes to proceed with screening PSA  testing. Lab Results  Component Value Date   PSA 0.46 06/13/2021   PSA 0.58 05/10/2020   PSA 0.49 04/19/2015      Immunization History  Administered Date(s) Administered   Fluad Quad(high Dose 65+) 11/05/2018   Influenza,inj,Quad PF,6+ Mos 03/08/2015, 11/13/2016, 11/05/2017   Influenza-Unspecified 03/08/2015, 01/12/2016, 11/13/2016, 11/05/2017, 11/30/2019, 10/22/2020, 11/01/2021   PFIZER(Purple Top)SARS-COV-2 Vaccination 02/21/2019, 03/14/2019, 10/26/2019   Pfizer Covid-19 Vaccine Bivalent Booster 87yrs & up 11/08/2020   Pneumococcal Conjugate-13 11/05/2018, 05/10/2020   Pneumococcal Polysaccharide-23 11/05/2018   Tdap 05/05/2012   Zoster Recombinant(Shingrix ) 12/24/2016, 03/12/2017   Zoster, Unspecified 12/24/2016, 03/12/2017  Flu and covid booster recommended in fall.   No results found. Optho once per years, glasses.   Dental: 2 times per year.   Alcohol: 4 per week.   Tobacco: none  Exercise: 6 days per week. Over per week.    History Patient Active Problem List   Diagnosis Date Noted   Excessive daytime sleepiness 03/26/2023   Witnessed episode of apnea 03/26/2023   Snoring 02/18/2023   Autoimmune disorder (HCC) 02/18/2023   Previous back surgery 11/24/2019   Left leg paresthesias 11/11/2018   Ankle weakness 09/16/2018   S/P lumbar laminectomy 02/03/2018   HLD (hyperlipidemia) 01/30/2018   Lumbar herniated disc 01/09/2018   Lumbosacral radiculopathy at S1 01/09/2018   Hypertension 08/20/2016   Gallstones 02/22/2011   Abdominal pain, right upper quadrant 02/22/2011   Crohn's disease (HCC) 02/13/2011   Otitis media, suppurative 11/09/2009   Dyslipidemia 07/22/2009   ANEMIA, IRON DEFICIENCY 07/22/2009   Asthma 07/22/2009   GERD 07/22/2009   PREDIABETES 07/22/2009   NONSPECIFIC ABNORM RESULTS KIDNEY FUNCTION STUDY 07/22/2009   Past Medical History:  Diagnosis Date   ANEMIA, IRON DEFICIENCY 07/22/2009   ASTHMA 07/22/2009   mild   GERD 07/22/2009    HYPERLIPIDEMIA 07/22/2009   Kidney stones 2001   6 times in past   OTITIS MEDIA, SUPPURATIVE 11/09/2009   PREDIABETES 07/22/2009   pt does not monitor cbg at home   Past Surgical History:  Procedure Laterality Date   BACK SURGERY     lumbar x 2   BIOPSY BOWEL  01/30/1980   exploration bowel surgery, chrons disease   CHOLECYSTECTOMY  02/26/2011   Procedure: LAPAROSCOPIC CHOLECYSTECTOMY WITH INTRAOPERATIVE CHOLANGIOGRAM;  Surgeon: Krystal CHRISTELLA Spinner, MD;  Location: [facility];  Service: General;  Laterality: N/A;   colonscopy  2012 and 2006   HERNIA REPAIR  2005 - approx   TONSILLECTOMY  1960 - approx   No Known Allergies Prior to Admission medications   Medication Sig Start Date End Date Taking? Authorizing Provider  losartan  (COZAAR ) 50 MG tablet Take 1 tablet (50 mg  total) by mouth daily. 01/18/23  Yes Levora Eric SAUNDERS, MD  pravastatin  (PRAVACHOL ) 40 MG tablet TAKE 1 TABLET BY MOUTH DAILY **MAY START WITH INTERMITTENT DOSING AND INCREASE TO DAILY AS TOLERATED** 07/29/23  Yes Levora Eric SAUNDERS, MD   Social History   Socioeconomic History   Marital status: Married    Spouse name: Not on file   Number of children: Not on file   Years of education: Not on file   Highest education level: Doctorate  Occupational History   Not on file  Tobacco Use   Smoking status: Never   Smokeless tobacco: Never  Vaping Use   Vaping status: Never Used  Substance and Sexual Activity   Alcohol use: Yes    Comment: occasional glass of wine   Drug use: No   Sexual activity: Yes  Other Topics Concern   Not on file  Social History Narrative   Lives at home with wife    Right handed   Caffeine: 2 cups/day   Social Drivers of Health   Financial Resource Strain: Low Risk  (08/18/2023)   Overall Financial Resource Strain (CARDIA)    Difficulty of Paying Living Expenses: Not hard at all  Food Insecurity: No Food Insecurity (08/18/2023)   Hunger Vital Sign    Worried About Running Out of Food in the  Last Year: Never true    Ran Out of Food in the Last Year: Never true  Transportation Needs: No Transportation Needs (08/18/2023)   PRAPARE - Administrator, Civil Service (Medical): No    Lack of Transportation (Non-Medical): No  Physical Activity: Sufficiently Active (08/18/2023)   Exercise Vital Sign    Days of Exercise per Week: 6 days    Minutes of Exercise per Session: 60 min  Stress: No Stress Concern Present (08/18/2023)   [employer] of Occupational Health - Occupational Stress Questionnaire    Feeling of Stress: Not at all  Social Connections: Socially Integrated (08/18/2023)   Social Connection and Isolation Panel    Frequency of Communication with Friends and Family: More than three times a week    Frequency of Social Gatherings with Friends and Family: More than three times a week    Attends Religious Services: More than 4 times per year    Active Member of [employer] or Organizations: Yes    Attends Engineer, structural: More than 4 times per year    Marital Status: Married  Catering manager Violence: Not on file    Review of Systems  Constitutional:  Negative for fatigue and unexpected weight change.  Eyes:  Negative for visual disturbance.  Respiratory:  Negative for cough, chest tightness and shortness of breath.   Cardiovascular:  Negative for chest pain, palpitations and leg swelling.  Gastrointestinal:  Negative for abdominal pain and blood in stool.  Neurological:  Negative for dizziness, light-headedness and headaches.   13 point review of systems per patient health survey noted.  Negative other than as indicated above or in HPI.    Objective:   Vitals:   08/22/23 0937  BP: 110/60  Pulse: (!) 58  Resp: 15  Temp: 98 F (36.7 C)  TempSrc: Temporal  SpO2: 98%  Weight: 180 lb 6.4 oz (81.8 kg)  Height: 6' 1 (1.854 m)     Physical Exam Vitals reviewed.  Constitutional:      Appearance: He is well-developed.  HENT:     Head:  Normocephalic and atraumatic.  Neck:  Vascular: No carotid bruit or JVD.  Cardiovascular:     Rate and Rhythm: Normal rate and regular rhythm.     Heart sounds: Normal heart sounds. No murmur heard. Pulmonary:     Effort: Pulmonary effort is normal.     Breath sounds: Normal breath sounds. No rales.  Musculoskeletal:     Right lower leg: No edema.     Left lower leg: No edema.  Skin:    General: Skin is warm and dry.  Neurological:     Mental Status: He is alert and oriented to person, place, and time.  Psychiatric:        Mood and Affect: Mood normal.        Assessment & Plan:  SARGON SCOUTEN is a 68 y.o. male . Primary hypertension - Plan: losartan  (COZAAR ) 50 MG tablet  - Stable on current regimen, continue same, labs as below.  Adjust plan accordingly.  Aortic dilatation (HCC) Atherosclerosis of aorta (HCC)  - Recent imaging stable, continue to monitor, lipid treatment as above.  Dyslipidemia - Plan: Comprehensive metabolic panel with GFR, Lipid panel  - Stable on current regimen, continue same, check labs and adjust plan accordingly.  Option of higher potency statin with Lipitor or Crestor.  MCI (mild cognitive impairment)  - Stable, denies changes, continue to monitor.  Keep follow-up with memory specialist as planned.  Screen for colon cancer - Plan: Ambulatory referral to Gastroenterology  PREDIABETES - Plan: Hemoglobin A1c  - Continue diet/exercise approach, check labs and adjust plan accordingly  Screening for malignant neoplasm of prostate - Plan: PSA, Medicare ( [facility] only)   No orders of the defined types were placed in this encounter.  Patient Instructions  Depending on labs, we have option of higher dose statin or more potent statin like Lipitor or Crestor. I will let you know once I see labs. Keep follow up with memory specialist as planned.   Thank you for coming in today.  If there are any concerns on your bloodwork, I will let you  know. Take care!      Signed,   Eric Pines, MD [facility], [facility] 08/22/23 10:24 AM

## 2023-08-22 NOTE — Patient Instructions (Addendum)
 Depending on labs, we have option of higher dose statin or more potent statin like Lipitor or Crestor. I will let you know once I see labs. Keep follow up with memory specialist as planned.   Thank you for coming in today.  If there are any concerns on your bloodwork, I will let you know. Take care!

## 2023-08-23 LAB — COMPREHENSIVE METABOLIC PANEL WITH GFR
ALT: 21 U/L (ref 0–53)
AST: 18 U/L (ref 0–37)
Albumin: 4.5 g/dL (ref 3.5–5.2)
Alkaline Phosphatase: 48 U/L (ref 39–117)
BUN: 19 mg/dL (ref 6–23)
CO2: 29 meq/L (ref 19–32)
Calcium: 9.3 mg/dL (ref 8.4–10.5)
Chloride: 99 meq/L (ref 96–112)
Creatinine, Ser: 1.07 mg/dL (ref 0.40–1.50)
GFR: 70.58 mL/min (ref >60.00)
Glucose, Bld: 78 mg/dL (ref 70–99)
Potassium: 4.4 meq/L (ref 3.5–5.1)
Sodium: 137 meq/L (ref 135–145)
Total Bilirubin: 1 mg/dL (ref 0.2–1.2)
Total Protein: 6.6 g/dL (ref 6.0–8.3)

## 2023-08-23 LAB — LIPID PANEL
Cholesterol: 160 mg/dL (ref 0–200)
HDL: 81.9 mg/dL (ref >39.00)
LDL Cholesterol: 71 mg/dL (ref 0–99)
NonHDL: 78.47
Total CHOL/HDL Ratio: 2
Triglycerides: 35 mg/dL (ref 0.0–149.0)
VLDL: 7 mg/dL (ref 0.0–40.0)

## 2023-08-23 LAB — PSA, MEDICARE: PSA: 0.54 ng/mL (ref 0.10–4.00)

## 2023-08-23 LAB — HEMOGLOBIN A1C: Hgb A1c MFr Bld: 5.9 % (ref 4.6–6.5)

## 2023-08-27 ENCOUNTER — Ambulatory Visit: Payer: Self-pay | Admitting: Family Medicine

## 2023-08-27 DIAGNOSIS — I10 Essential (primary) hypertension: Secondary | ICD-10-CM

## 2023-08-27 DIAGNOSIS — E785 Hyperlipidemia, unspecified: Secondary | ICD-10-CM

## 2023-08-27 MED ORDER — LOSARTAN POTASSIUM 50 MG PO TABS: 50.0000 mg | ORAL_TABLET | Freq: Every day | ORAL | 1 refills | Status: DC

## 2023-08-27 MED ORDER — PRAVASTATIN SODIUM 40 MG PO TABS: 40.0000 mg | ORAL_TABLET | Freq: Every day | ORAL | 1 refills | Status: DC

## 2023-09-20 ENCOUNTER — Encounter: Payer: Self-pay | Admitting: Family Medicine

## 2023-10-03 ENCOUNTER — Ambulatory Visit (INDEPENDENT_AMBULATORY_CARE_PROVIDER_SITE_OTHER): Admitting: Family Medicine

## 2023-10-03 ENCOUNTER — Encounter: Payer: Self-pay | Admitting: Family Medicine

## 2023-10-03 VITALS — BP 126/60 | HR 68 | Temp 97.8°F | Resp 16 | Ht 73.0 in | Wt 181.8 lb

## 2023-10-03 DIAGNOSIS — K4090 Unilateral inguinal hernia, without obstruction or gangrene, not specified as recurrent: Secondary | ICD-10-CM | POA: Diagnosis not present

## 2023-10-03 NOTE — Patient Instructions (Signed)
 Thank you for coming in today.  I do think there is a hernia on the right side and I will refer you to the general surgeon to evaluate that area and discuss treatment options.  See information below.  If any difficulty reducing hernia, increasing pain or swelling be seen.  I do not expect that to occur.  Take care!  Hernia, Adult     A hernia happens when an organ or tissue inside your body pushes out through a weak spot in the muscles of your belly. This makes a bulge. The bulge may be: In a scar from surgery. This type of bulge is called an incisional hernia. Near your belly button. This type is called an umbilical hernia. In your groin, which is the area where your leg meets your lower belly. This type is called an inguinal hernia. If you're male, this type could also be in your scrotum. In your upper thigh. This type is called a femoral hernia. Inside your belly. This type is called a hiatal hernia. It happens when your stomach slides above your diaphragm, which is the muscle between your belly and your chest. What are the causes? You may get a hernia if: You lift heavy things. You cough over a long period of time. You have trouble pooping, also called constipation. Trouble pooping can lead to straining. There's a cut from surgery in your belly. You have a problem that's present at birth. There's fluid around your belly. You're male and have a testicle that hasn't moved down into your scrotum. You may be at greater risk for hernia if: You smoke. You're very overweight. What are the signs or symptoms? The main symptom is a bulge, but the bulge may not always be seen. It may grow bigger or be easier to see when you cough or strain, such as when you lift something heavy. If you can push the bulge back into your belly, it may not cause pain. If you can't push it back into your belly, it may lose its blood supply. This can cause: Pain. Fever. Nausea and vomiting. Swelling. Trouble  pooping. How is this diagnosed? A hernia may be diagnosed based on your symptoms, medical history, and an exam. Your health care provider may ask you to cough or move in a way that makes the bulge easier to see. You may also have tests done. These may include: X-rays. Ultrasound. CT scan. How is this treated? A hernia that's small and painless may not need to be treated. A hernia that's large or painful may be treated with surgery. Surgery involves pushing the bulge back into place and fixing the weak area of the muscle or belly. Follow these instructions at home: Activity Try not to strain. You may have to avoid lifting. Ask how much weight you can safely lift. If you lift something heavy, use your leg muscles. Do not use your back muscles to lift. Prevent trouble pooping You may need to take these actions to prevent or treat trouble pooping: Drink enough fluid to keep your pee (urine) pale yellow. Take medicines to help you poop. Eat foods high in fiber. These include beans, whole grains, and fresh fruits and vegetables. General instructions When you cough, try to cough gently. Try to push the bulge back in by very gently pressing on it when you're lying down. Do not try to force it back in if it won't push in easily. If you're overweight, work with your provider to lose weight safely. Do  not smoke, vape, or use products with nicotine or tobacco in them. If you need help quitting, talk with your provider. If you're going to have surgery, watch your hernia for changes in shape, size, or color. Tell your provider about any changes. Contact a health care provider if: You get new pain, swelling, or redness near your hernia. You have trouble pooping. Your poop is harder or larger than normal. You have a fever or chills. You have nausea or vomiting. Your hernia can't be pushed in. Get help right away if: You have belly pain that gets worse. Your hernia: Changes in shape or  size. Changes color. Feels hard or hurts when you touch it. These symptoms may be an emergency. Call 911 right away. Do not wait to see if the symptoms will go away. Do not drive yourself to the hospital. This information is not intended to replace advice given to you by your health care provider. Make sure you discuss any questions you have with your health care provider. Document Revised: 10/18/2022 Document Reviewed: 04/10/2022 Elsevier Patient Education  2024 [employer].

## 2023-10-03 NOTE — Progress Notes (Signed)
 Subjective:  Patient ID: Eric Duran, male    DOB: 06-Jan-1956  Age: 68 y.o. MRN: [id number]  CC:  Chief Complaint  Patient presents with   Hernia    Pt notes new suspected hernia starting on lower Rt side of abdomen into groin area, pt first noticed this about 3-4 weeks ago     HPI Eric Duran presents for   Groin swelling Noticed small bulge in R groin when showering about a month ago.  No known injury or onset, but exercising with gym exercises 3 days per week. No pain with exercise, but twinge in that area form time to time.  Able to reduce area.  No bowel changes. No abd pain.   Prior hernia repair in about 2006 on left side.   History Patient Active Problem List   Diagnosis Date Noted   Excessive daytime sleepiness 03/26/2023   Witnessed episode of apnea 03/26/2023   Snoring 02/18/2023   Autoimmune disorder (HCC) 02/18/2023   Previous back surgery 11/24/2019   Left leg paresthesias 11/11/2018   Ankle weakness 09/16/2018   S/P lumbar laminectomy 02/03/2018   HLD (hyperlipidemia) 01/30/2018   Lumbar herniated disc 01/09/2018   Lumbosacral radiculopathy at S1 01/09/2018   Hypertension 08/20/2016   Abdominal pain, right upper quadrant 02/22/2011   Crohn's disease (HCC) 02/13/2011   Otitis media, suppurative 11/09/2009   Dyslipidemia 07/22/2009   Asthma 07/22/2009   GERD 07/22/2009   PREDIABETES 07/22/2009   Past Medical History:  Diagnosis Date   ANEMIA, IRON DEFICIENCY 07/22/2009   ASTHMA 07/22/2009   mild   GERD 07/22/2009   HYPERLIPIDEMIA 07/22/2009   Kidney stones 2001   6 times in past   OTITIS MEDIA, SUPPURATIVE 11/09/2009   PREDIABETES 07/22/2009   pt does not monitor cbg at home   Past Surgical History:  Procedure Laterality Date   BACK SURGERY     lumbar x 2   BIOPSY BOWEL  01/30/1980   exploration bowel surgery, chrons disease   CHOLECYSTECTOMY  02/26/2011   Procedure: LAPAROSCOPIC CHOLECYSTECTOMY WITH INTRAOPERATIVE CHOLANGIOGRAM;   Surgeon: Krystal CHRISTELLA Spinner, MD;  Location: [facility];  Service: General;  Laterality: N/A;   colonscopy  2012 and 2006   HERNIA REPAIR  2005 - approx   TONSILLECTOMY  1960 - approx   No Known Allergies Prior to Admission medications   Medication Sig Start Date End Date Taking? Authorizing Provider  losartan  (COZAAR ) 50 MG tablet Take 1 tablet (50 mg total) by mouth daily. 08/27/23  Yes Levora Reyes SAUNDERS, MD  pravastatin  (PRAVACHOL ) 40 MG tablet Take 1 tablet (40 mg total) by mouth daily. 08/27/23  Yes Levora Reyes SAUNDERS, MD   Social History   Socioeconomic History   Marital status: Married    Spouse name: Not on file   Number of children: Not on file   Years of education: Not on file   Highest education level: Doctorate  Occupational History   Not on file  Tobacco Use   Smoking status: Never   Smokeless tobacco: Never  Vaping Use   Vaping status: Never Used  Substance and Sexual Activity   Alcohol use: Yes    Comment: occasional glass of wine   Drug use: No   Sexual activity: Yes  Other Topics Concern   Not on file  Social History Narrative   Lives at home with wife    Right handed   Caffeine: 2 cups/day   Social Drivers of Corporate investment banker  Strain: Low Risk  (08/18/2023)   Overall Financial Resource Strain (CARDIA)    Difficulty of Paying Living Expenses: Not hard at all  Food Insecurity: No Food Insecurity (08/18/2023)   Hunger Vital Sign    Worried About Running Out of Food in the Last Year: Never true    Ran Out of Food in the Last Year: Never true  Transportation Needs: No Transportation Needs (08/18/2023)   PRAPARE - Administrator, Civil Service (Medical): No    Lack of Transportation (Non-Medical): No  Physical Activity: Sufficiently Active (08/18/2023)   Exercise Vital Sign    Days of Exercise per Week: 6 days    Minutes of Exercise per Session: 60 min  Stress: No Stress Concern Present (08/18/2023)   [employer] of Occupational Health -  Occupational Stress Questionnaire    Feeling of Stress: Not at all  Social Connections: Socially Integrated (08/18/2023)   Social Connection and Isolation Panel    Frequency of Communication with Friends and Family: More than three times a week    Frequency of Social Gatherings with Friends and Family: More than three times a week    Attends Religious Services: More than 4 times per year    Active Member of [employer] or Organizations: Yes    Attends Engineer, structural: More than 4 times per year    Marital Status: Married  Catering manager Violence: Not on file    Review of Systems   Objective:   Vitals:   10/03/23 1002  BP: 126/60  Pulse: 68  Resp: 16  Temp: 97.8 F (36.6 C)  TempSrc: Temporal  SpO2: 99%  Weight: 181 lb 12.8 oz (82.5 kg)  Height: 6' 1 (1.854 m)     Physical Exam Constitutional:      General: He is not in acute distress.    Appearance: Normal appearance. He is well-developed.  HENT:     Head: Normocephalic and atraumatic.  Cardiovascular:     Rate and Rhythm: Normal rate.  Pulmonary:     Effort: Pulmonary effort is normal.  Abdominal:     General: Abdomen is flat. There is no distension.     Tenderness: There is no abdominal tenderness.     Hernia: A hernia (Reducible right inguinal hernia.  No surrounding erythema or tenderness.) is present.   Neurological:     Mental Status: He is alert and oriented to person, place, and time.  Psychiatric:        Mood and Affect: Mood normal.        Assessment & Plan:  Eric Duran is a 68 y.o. male . Right inguinal hernia - Plan: Ambulatory referral to General Surgery Reducible right inguinal hernia, without significant symptoms.  Discussed risks of untreated hernia, discussed incarcerated, strangulated hernias and ER precautions.  Asymptomatic overall at this time.  Referred to general surgery.  No orders of the defined types were placed in this encounter.  Patient Instructions  Thank you  for coming in today.  I do think there is a hernia on the right side and I will refer you to the general surgeon to evaluate that area and discuss treatment options.  See information below.  If any difficulty reducing hernia, increasing pain or swelling be seen.  I do not expect that to occur.  Take care!  Hernia, Adult     A hernia happens when an organ or tissue inside your body pushes out through a weak spot in the muscles  of your belly. This makes a bulge. The bulge may be: In a scar from surgery. This type of bulge is called an incisional hernia. Near your belly button. This type is called an umbilical hernia. In your groin, which is the area where your leg meets your lower belly. This type is called an inguinal hernia. If you're male, this type could also be in your scrotum. In your upper thigh. This type is called a femoral hernia. Inside your belly. This type is called a hiatal hernia. It happens when your stomach slides above your diaphragm, which is the muscle between your belly and your chest. What are the causes? You may get a hernia if: You lift heavy things. You cough over a long period of time. You have trouble pooping, also called constipation. Trouble pooping can lead to straining. There's a cut from surgery in your belly. You have a problem that's present at birth. There's fluid around your belly. You're male and have a testicle that hasn't moved down into your scrotum. You may be at greater risk for hernia if: You smoke. You're very overweight. What are the signs or symptoms? The main symptom is a bulge, but the bulge may not always be seen. It may grow bigger or be easier to see when you cough or strain, such as when you lift something heavy. If you can push the bulge back into your belly, it may not cause pain. If you can't push it back into your belly, it may lose its blood supply. This can cause: Pain. Fever. Nausea and vomiting. Swelling. Trouble pooping. How  is this diagnosed? A hernia may be diagnosed based on your symptoms, medical history, and an exam. Your health care provider may ask you to cough or move in a way that makes the bulge easier to see. You may also have tests done. These may include: X-rays. Ultrasound. CT scan. How is this treated? A hernia that's small and painless may not need to be treated. A hernia that's large or painful may be treated with surgery. Surgery involves pushing the bulge back into place and fixing the weak area of the muscle or belly. Follow these instructions at home: Activity Try not to strain. You may have to avoid lifting. Ask how much weight you can safely lift. If you lift something heavy, use your leg muscles. Do not use your back muscles to lift. Prevent trouble pooping You may need to take these actions to prevent or treat trouble pooping: Drink enough fluid to keep your pee (urine) pale yellow. Take medicines to help you poop. Eat foods high in fiber. These include beans, whole grains, and fresh fruits and vegetables. General instructions When you cough, try to cough gently. Try to push the bulge back in by very gently pressing on it when you're lying down. Do not try to force it back in if it won't push in easily. If you're overweight, work with your provider to lose weight safely. Do not smoke, vape, or use products with nicotine or tobacco in them. If you need help quitting, talk with your provider. If you're going to have surgery, watch your hernia for changes in shape, size, or color. Tell your provider about any changes. Contact a health care provider if: You get new pain, swelling, or redness near your hernia. You have trouble pooping. Your poop is harder or larger than normal. You have a fever or chills. You have nausea or vomiting. Your hernia can't be pushed in. Get  help right away if: You have belly pain that gets worse. Your hernia: Changes in shape or size. Changes  color. Feels hard or hurts when you touch it. These symptoms may be an emergency. Call 911 right away. Do not wait to see if the symptoms will go away. Do not drive yourself to the hospital. This information is not intended to replace advice given to you by your health care provider. Make sure you discuss any questions you have with your health care provider. Document Revised: 10/18/2022 Document Reviewed: 04/10/2022 Elsevier Patient Education  2024 Elsevier Inc.    Signed,   Reyes Pines, MD [facility], [facility] 10/03/23 10:52 AM

## 2023-10-23 ENCOUNTER — Ambulatory Visit: Payer: Self-pay | Admitting: General Surgery

## 2023-10-23 DIAGNOSIS — K4090 Unilateral inguinal hernia, without obstruction or gangrene, not specified as recurrent: Secondary | ICD-10-CM | POA: Insufficient documentation

## 2023-11-01 ENCOUNTER — Other Ambulatory Visit: Payer: Self-pay | Admitting: Family Medicine

## 2023-11-01 DIAGNOSIS — E785 Hyperlipidemia, unspecified: Secondary | ICD-10-CM

## 2024-01-03 ENCOUNTER — Encounter (HOSPITAL_BASED_OUTPATIENT_CLINIC_OR_DEPARTMENT_OTHER): Payer: Self-pay | Admitting: General Surgery

## 2024-01-03 ENCOUNTER — Other Ambulatory Visit: Payer: Self-pay

## 2024-01-07 ENCOUNTER — Encounter (HOSPITAL_BASED_OUTPATIENT_CLINIC_OR_DEPARTMENT_OTHER)
Admission: RE | Admit: 2024-01-07 | Discharge: 2024-01-07 | Disposition: A | Source: Ambulatory Visit | Attending: General Surgery

## 2024-01-07 DIAGNOSIS — Z01818 Encounter for other preprocedural examination: Secondary | ICD-10-CM | POA: Diagnosis not present

## 2024-01-07 MED ORDER — CHLORHEXIDINE GLUCONATE CLOTH 2 % EX PADS: 6.0000 | MEDICATED_PAD | Freq: Once | CUTANEOUS | Status: DC

## 2024-01-07 NOTE — Progress Notes (Signed)

## 2024-01-10 ENCOUNTER — Ambulatory Visit (HOSPITAL_BASED_OUTPATIENT_CLINIC_OR_DEPARTMENT_OTHER): Admitting: Anesthesiology

## 2024-01-10 ENCOUNTER — Encounter (HOSPITAL_BASED_OUTPATIENT_CLINIC_OR_DEPARTMENT_OTHER): Payer: Self-pay | Admitting: General Surgery

## 2024-01-10 ENCOUNTER — Other Ambulatory Visit: Payer: Self-pay

## 2024-01-10 ENCOUNTER — Encounter (HOSPITAL_BASED_OUTPATIENT_CLINIC_OR_DEPARTMENT_OTHER): Admission: RE | Disposition: A | Payer: Self-pay | Source: Home / Self Care | Attending: General Surgery

## 2024-01-10 ENCOUNTER — Ambulatory Visit (HOSPITAL_BASED_OUTPATIENT_CLINIC_OR_DEPARTMENT_OTHER)
Admission: RE | Admit: 2024-01-10 | Discharge: 2024-01-10 | Disposition: A | Attending: General Surgery | Admitting: General Surgery

## 2024-01-10 DIAGNOSIS — K219 Gastro-esophageal reflux disease without esophagitis: Secondary | ICD-10-CM | POA: Diagnosis not present

## 2024-01-10 DIAGNOSIS — I10 Essential (primary) hypertension: Secondary | ICD-10-CM

## 2024-01-10 DIAGNOSIS — K4090 Unilateral inguinal hernia, without obstruction or gangrene, not specified as recurrent: Secondary | ICD-10-CM | POA: Diagnosis present

## 2024-01-10 DIAGNOSIS — M1990 Unspecified osteoarthritis, unspecified site: Secondary | ICD-10-CM | POA: Diagnosis not present

## 2024-01-10 DIAGNOSIS — K5090 Crohn's disease, unspecified, without complications: Secondary | ICD-10-CM | POA: Diagnosis not present

## 2024-01-10 DIAGNOSIS — Z79899 Other long term (current) drug therapy: Secondary | ICD-10-CM | POA: Diagnosis not present

## 2024-01-10 DIAGNOSIS — J45909 Unspecified asthma, uncomplicated: Secondary | ICD-10-CM | POA: Diagnosis not present

## 2024-01-10 HISTORY — DX: Personal history of urinary calculi: Z87.442

## 2024-01-10 HISTORY — DX: Unspecified asthma, uncomplicated: J45.909

## 2024-01-10 HISTORY — DX: Unspecified osteoarthritis, unspecified site: M19.90

## 2024-01-10 HISTORY — DX: Essential (primary) hypertension: I10

## 2024-01-10 SURGERY — REPAIR, HERNIA, INGUINAL, ADULT
Anesthesia: General | Site: Groin | Laterality: Right

## 2024-01-10 MED ORDER — FENTANYL CITRATE (PF) 100 MCG/2ML IJ SOLN
25.0000 ug | INTRAMUSCULAR | Status: DC | PRN
  Administered 2024-01-10: 50 ug via INTRAVENOUS

## 2024-01-10 MED ORDER — LACTATED RINGERS IV SOLN: INTRAVENOUS | Status: DC

## 2024-01-10 MED ORDER — GLYCOPYRROLATE PF 0.2 MG/ML IJ SOSY
PREFILLED_SYRINGE | INTRAMUSCULAR | Status: AC
  Filled 2024-01-10: qty 1

## 2024-01-10 MED ORDER — PROPOFOL 1000 MG/100ML IV EMUL
INTRAVENOUS | Status: AC
  Filled 2024-01-10: qty 100

## 2024-01-10 MED ORDER — ROCURONIUM BROMIDE 100 MG/10ML IV SOLN
INTRAVENOUS | Status: DC | PRN
  Administered 2024-01-10: 60 mg via INTRAVENOUS
  Administered 2024-01-10: 10 mg via INTRAVENOUS

## 2024-01-10 MED ORDER — ONDANSETRON HCL 4 MG/2ML IJ SOLN
INTRAMUSCULAR | Status: DC | PRN
  Administered 2024-01-10: 4 mg via INTRAVENOUS

## 2024-01-10 MED ORDER — OXYCODONE HCL 5 MG PO TABS: 5.0000 mg | ORAL_TABLET | Freq: Four times a day (QID) | ORAL | 0 refills | Status: DC | PRN

## 2024-01-10 MED ORDER — MIDAZOLAM HCL 2 MG/2ML IJ SOLN
INTRAMUSCULAR | Status: AC
  Filled 2024-01-10: qty 2

## 2024-01-10 MED ORDER — GABAPENTIN 100 MG PO CAPS
100.0000 mg | ORAL_CAPSULE | ORAL | Status: AC
  Administered 2024-01-10: 100 mg via ORAL

## 2024-01-10 MED ORDER — CEFAZOLIN SODIUM-DEXTROSE 2-4 GM/100ML-% IV SOLN
2.0000 g | INTRAVENOUS | Status: AC
  Administered 2024-01-10: 2 g via INTRAVENOUS

## 2024-01-10 MED ORDER — EPHEDRINE 5 MG/ML INJ
INTRAVENOUS | Status: AC
  Filled 2024-01-10: qty 10

## 2024-01-10 MED ORDER — SUGAMMADEX SODIUM 200 MG/2ML IV SOLN
INTRAVENOUS | Status: DC | PRN
  Administered 2024-01-10: 200 mg via INTRAVENOUS

## 2024-01-10 MED ORDER — CEFAZOLIN SODIUM-DEXTROSE 2-4 GM/100ML-% IV SOLN
INTRAVENOUS | Status: AC
  Filled 2024-01-10: qty 100

## 2024-01-10 MED ORDER — DEXAMETHASONE SOD PHOSPHATE PF 10 MG/ML IJ SOLN
INTRAMUSCULAR | Status: DC | PRN
  Administered 2024-01-10: 10 mg

## 2024-01-10 MED ORDER — BUPIVACAINE-EPINEPHRINE (PF) 0.25% -1:200000 IJ SOLN
INTRAMUSCULAR | Status: AC
  Filled 2024-01-10: qty 150

## 2024-01-10 MED ORDER — PHENYLEPHRINE HCL-NACL 20-0.9 MG/250ML-% IV SOLN
INTRAVENOUS | Status: AC
  Filled 2024-01-10: qty 500

## 2024-01-10 MED ORDER — FENTANYL CITRATE (PF) 100 MCG/2ML IJ SOLN
INTRAMUSCULAR | Status: AC
  Filled 2024-01-10: qty 2

## 2024-01-10 MED ORDER — FENTANYL CITRATE (PF) 100 MCG/2ML IJ SOLN
50.0000 ug | Freq: Once | INTRAMUSCULAR | Status: AC
  Administered 2024-01-10: 50 ug via INTRAVENOUS

## 2024-01-10 MED ORDER — ACETAMINOPHEN 500 MG PO TABS
1000.0000 mg | ORAL_TABLET | ORAL | Status: AC
  Administered 2024-01-10: 1000 mg via ORAL
  Filled 2024-01-10: qty 2

## 2024-01-10 MED ORDER — MIDAZOLAM HCL (PF) 2 MG/2ML IJ SOLN
1.0000 mg | Freq: Once | INTRAMUSCULAR | Status: AC
  Administered 2024-01-10: 1 mg via INTRAVENOUS

## 2024-01-10 MED ORDER — OXYCODONE HCL 5 MG/5ML PO SOLN: 5.0000 mg | Freq: Once | ORAL | Status: DC | PRN

## 2024-01-10 MED ORDER — PROPOFOL 10 MG/ML IV BOLUS
INTRAVENOUS | Status: DC | PRN
  Administered 2024-01-10: 170 mg via INTRAVENOUS

## 2024-01-10 MED ORDER — LIDOCAINE HCL (CARDIAC) PF 100 MG/5ML IV SOSY
PREFILLED_SYRINGE | INTRAVENOUS | Status: DC | PRN
  Administered 2024-01-10: 60 mg via INTRAVENOUS

## 2024-01-10 MED ORDER — DROPERIDOL 2.5 MG/ML IJ SOLN: 0.6250 mg | Freq: Once | INTRAMUSCULAR | Status: DC | PRN

## 2024-01-10 MED ORDER — FENTANYL CITRATE (PF) 100 MCG/2ML IJ SOLN
INTRAMUSCULAR | Status: DC | PRN
  Administered 2024-01-10 (×2): 50 ug via INTRAVENOUS

## 2024-01-10 MED ORDER — OXYCODONE HCL 5 MG PO TABS: 5.0000 mg | ORAL_TABLET | Freq: Once | ORAL | Status: DC | PRN

## 2024-01-10 MED ORDER — GABAPENTIN 100 MG PO CAPS
ORAL_CAPSULE | ORAL | Status: AC
  Filled 2024-01-10: qty 1

## 2024-01-10 MED ORDER — CELECOXIB 200 MG PO CAPS
200.0000 mg | ORAL_CAPSULE | Freq: Once | ORAL | Status: AC
  Administered 2024-01-10: 200 mg via ORAL

## 2024-01-10 MED ORDER — BUPIVACAINE-EPINEPHRINE 0.25% -1:200000 IJ SOLN
INTRAMUSCULAR | Status: DC | PRN
  Administered 2024-01-10: 20 mL

## 2024-01-10 MED ORDER — ROPIVACAINE HCL 5 MG/ML IJ SOLN
INTRAMUSCULAR | Status: DC | PRN
  Administered 2024-01-10: 30 mL

## 2024-01-10 MED ORDER — CELECOXIB 200 MG PO CAPS
ORAL_CAPSULE | ORAL | Status: AC
  Filled 2024-01-10: qty 1

## 2024-01-10 MED ADMIN — Ephedrine Sulfate Prefilled Syringe 25 MG/5ML (5 MG/ML): 5 mg | INTRAVENOUS | NDC 51754425003

## 2024-01-10 MED ADMIN — Sodium Chloride Irrigation Soln 0.9%: 1000 mL | NDC 99999050048

## 2024-01-10 MED ADMIN — Ephedrine Sulfate Prefilled Syringe 25 MG/5ML (5 MG/ML): 10 mg | INTRAVENOUS | NDC 51754425003

## 2024-01-10 MED ADMIN — Phenylephrine HCl IV Soln 10 MG/ML: 40 ug | INTRAVENOUS | NDC 00641614225

## 2024-01-10 MED ADMIN — Phenylephrine HCl IV Soln 10 MG/ML: 80 ug | INTRAVENOUS | NDC 00641614225

## 2024-01-10 SURGICAL SUPPLY — 40 items
BENZOIN TINCTURE PRP APPL 2/3 (GAUZE/BANDAGES/DRESSINGS) IMPLANT
BLADE CLIPPER SURG (BLADE) IMPLANT
BLADE HEX COATED 2.75 (ELECTRODE) ×1 IMPLANT
BLADE SURG 15 STRL LF DISP TIS (BLADE) ×1 IMPLANT
CHLORAPREP W/TINT 26 (MISCELLANEOUS) ×1 IMPLANT
COVER BACK TABLE 60X90IN (DRAPES) ×1 IMPLANT
COVER MAYO STAND STRL (DRAPES) ×1 IMPLANT
DERMABOND ADVANCED .7 DNX12 (GAUZE/BANDAGES/DRESSINGS) ×1 IMPLANT
DRAIN PENROSE .5X12 LATEX STL (DRAIN) ×1 IMPLANT
DRAPE LAPAROTOMY TRNSV 102X78 (DRAPES) ×1 IMPLANT
DRAPE UTILITY XL STRL (DRAPES) ×1 IMPLANT
ELECTRODE REM PT RTRN 9FT ADLT (ELECTROSURGICAL) ×1 IMPLANT
GLOVE BIO SURGEON STRL SZ 6.5 (GLOVE) IMPLANT
GLOVE BIO SURGEON STRL SZ7 (GLOVE) IMPLANT
GLOVE BIO SURGEON STRL SZ7.5 (GLOVE) ×1 IMPLANT
GLOVE BIOGEL PI IND STRL 7.0 (GLOVE) IMPLANT
GLOVE SURG SS PI 6.5 STRL IVOR (GLOVE) IMPLANT
GOWN STRL REUS W/ TWL LRG LVL3 (GOWN DISPOSABLE) ×2 IMPLANT
MESH ULTRAPRO 3X6 7.6X15CM (Mesh General) IMPLANT
NDL HYPO 25X1 1.5 SAFETY (NEEDLE) ×1 IMPLANT
NEEDLE HYPO 25X1 1.5 SAFETY (NEEDLE) ×1 IMPLANT
PACK BASIN DAY SURGERY FS (CUSTOM PROCEDURE TRAY) ×1 IMPLANT
PENCIL SMOKE EVACUATOR (MISCELLANEOUS) ×1 IMPLANT
SLEEVE SCD COMPRESS KNEE MED (STOCKING) ×1 IMPLANT
SOLN 0.9% NACL POUR BTL 1000ML (IV SOLUTION) ×1 IMPLANT
SPIKE FLUID TRANSFER (MISCELLANEOUS) IMPLANT
SPONGE T-LAP 18X18 ~~LOC~~+RFID (SPONGE) ×1 IMPLANT
STRIP CLOSURE SKIN 1/2X4 (GAUZE/BANDAGES/DRESSINGS) IMPLANT
SUT MON AB 4-0 PC3 18 (SUTURE) ×1 IMPLANT
SUT PROLENE 2 0 SH DA (SUTURE) ×2 IMPLANT
SUT SILK 2 0 SH (SUTURE) IMPLANT
SUT SILK 3 0 SH 30 (SUTURE) IMPLANT
SUT SILK 3-0 18XBRD TIE BLK (SUTURE) ×1 IMPLANT
SUT VIC AB 0 CT1 27XBRD ANBCTR (SUTURE) IMPLANT
SUT VIC AB 2-0 SH 27XBRD (SUTURE) ×1 IMPLANT
SUT VIC AB 3-0 SH 27X BRD (SUTURE) ×1 IMPLANT
SUT VICRYL 0 SH 27 (SUTURE) IMPLANT
SYR BULB EAR ULCER 3OZ GRN STR (SYRINGE) ×1 IMPLANT
SYR CONTROL 10ML LL (SYRINGE) ×1 IMPLANT
TOWEL GREEN STERILE FF (TOWEL DISPOSABLE) ×2 IMPLANT

## 2024-01-10 NOTE — Transfer of Care (Signed)
 Immediate Anesthesia Transfer of Care Note  Patient: Eric Duran  Procedure(s) Performed: RIGHT INGUINAL HERNIA REPAIR WITH MESH, ADULT (Right: Groin)  Patient Location: PACU  Anesthesia Type:General  Level of Consciousness: awake, alert , and patient cooperative  Airway & Oxygen Therapy: Patient Spontanous Breathing and Patient connected to nasal cannula oxygen  Post-op Assessment: Report given to RN and Post -op Vital signs reviewed and stable  Post vital signs: Reviewed and stable  Last Vitals:  Vitals Value Taken Time  BP 139/64 01/10/24 09:40  Temp    Pulse 63 01/10/24 09:41  Resp 14 01/10/24 09:41  SpO2 100 % 01/10/24 09:41  Vitals shown include unfiled device data.  Last Pain:  Vitals:   01/10/24 0711  PainSc: 0-No pain         Complications: No notable events documented.

## 2024-01-10 NOTE — Anesthesia Procedure Notes (Signed)
 Anesthesia Regional Block: TAP block   Pre-Anesthetic Checklist: , timeout performed,  Correct Patient, Correct Site, Correct Laterality,  Correct Procedure, Correct Position, site marked,  Risks and benefits discussed,  Surgical consent,  Pre-op evaluation,  At surgeon's request and post-op pain management  Laterality: Right  Prep: Dura Prep       Needles:  Injection technique: Single-shot  Needle Type: Echogenic Stimulator Needle     Needle Length: 10cm  Needle Gauge: 20     Additional Needles:   Procedures:,,,, ultrasound used (permanent image in chart),,    Narrative:  Start time: 01/10/2024 8:00 AM End time: 01/10/2024 8:05 AM Injection made incrementally with aspirations every 5 mL.  Performed by: Personally  Anesthesiologist: Dorethea Cordella SQUIBB, DO  Additional Notes: Patient identified. Risks/Benefits/Options discussed with patient including but not limited to bleeding, infection, nerve damage, failed block, incomplete pain control. Patient expressed understanding and wished to proceed. All questions were answered. Sterile technique was used throughout the entire procedure. Please see nursing notes for vital signs. Aspirated in 5cc intervals with injection for negative confirmation. Patient was given instructions on fall risk and not to get out of bed. All questions and concerns addressed with instructions to call with any issues or inadequate analgesia.

## 2024-01-10 NOTE — Interval H&P Note (Signed)
 History and Physical Interval Note:  01/10/2024 7:42 AM  Eric Duran  has presented today for surgery, with the diagnosis of RIGHT INGUINAL HERNIA.  The various methods of treatment have been discussed with the patient and family. After consideration of risks, benefits and other options for treatment, the patient has consented to  Procedures with comments: REPAIR, HERNIA, INGUINAL, ADULT (Right) - OPEN, RIGHT INGUINAL HERNIA, MESH GENERAL & TAP BLOCK as a surgical intervention.  The patient's history has been reviewed, patient examined, no change in status, stable for surgery.  I have reviewed the patient's chart and labs.  Questions were answered to the patient's satisfaction.     Deward Null III

## 2024-01-10 NOTE — H&P (Signed)
 REFERRING PHYSICIAN: Levora Reyes SAUNDERS, MD PROVIDER: DEWARD GARNETTE NULL, MD MRN: [id number] DOB: 1955/03/23 Subjective   Chief Complaint: No chief complaint on file.  History of Present Illness: Eric Duran is a 68 y.o. male who is seen today as an office consultation for evaluation of No chief complaint on file.  We are asked to see the patient in consultation by Dr. Reyes Seip to evaluate him for a right inguinal hernia. The patient is a 68 year old white male who has been working out recently. He recently noticed a small bulge in the right groin. He denies any pain associated with it. He denies any nausea or vomiting. He has had a left inguinal hernia repair in the past. He also has some Crohn's disease but is not taking any medicines for it.  Review of Systems: A complete review of systems was obtained from the patient. I have reviewed this information and discussed as appropriate with the patient. See HPI as well for other ROS.  ROS   Medical History: Past Medical History:  Diagnosis Date  Arrhythmia  GERD (gastroesophageal reflux disease)  Hyperlipidemia  Hypertension  Sleep apnea   Patient Active Problem List  Diagnosis  Right inguinal hernia   Past Surgical History:  Procedure Laterality Date  Back Surgery  CHOLECYSTECTOMY  HERNIA REPAIR  TONSILLECTOMY    No Known Allergies  Current Outpatient Medications on File Prior to Visit  Medication Sig Dispense Refill  losartan  (COZAAR ) 100 MG tablet Take 100 mg by mouth once daily  pravastatin  (PRAVACHOL ) 40 MG tablet Take 40 mg by mouth once daily  esomeprazole (NEXIUM) 20 MG DR capsule Take by mouth  ibuprofen (MOTRIN) 600 MG tablet Take 600 mg by mouth every 6 (six) hours as needed for Pain   No current facility-administered medications on file prior to visit.   History reviewed. No pertinent family history.   Social History   Tobacco Use  Smoking Status Never  Smokeless Tobacco Never    Social History    Socioeconomic History  Marital status: Married  Tobacco Use  Smoking status: Never  Smokeless tobacco: Never  Vaping Use  Vaping status: Never Used  Substance and Sexual Activity  Alcohol use: Yes  Alcohol/week: 2.0 - 6.0 standard drinks of alcohol  Types: 2 - 6 Standard drinks or equivalent per week  Drug use: Never   Social Drivers of Health   Financial Resource Strain: Low Risk (08/18/2023)  Received from [facility]  Overall Financial Resource Strain (CARDIA)  How hard is it for you to pay for the very basics like food, housing, medical care, and heating?: Not hard at all  Food Insecurity: No Food Insecurity (08/18/2023)  Received from [facility]  Hunger Vital Sign  Within the past 12 months, you worried that your food would run out before you got the money to buy more.: Never true  Within the past 12 months, the food you bought just didn't last and you didn't have money to get more.: Never true  Transportation Needs: No Transportation Needs (08/18/2023)  Received from [facility] - Transportation  In the past 12 months, has lack of transportation kept you from medical appointments or from getting medications?: No  In the past 12 months, has lack of transportation kept you from meetings, work, or from getting things needed for daily living?: No  Physical Activity: Sufficiently Active (08/18/2023)  Received from [facility]  Exercise Vital Sign  On average, how many days per week do you  engage in moderate to strenuous exercise (like a brisk walk)?: 6 days  On average, how many minutes do you engage in exercise at this level?: 60 min  Stress: No Stress Concern Present (08/18/2023)  Received from [facility] - Occupational Stress Questionnaire  Do you feel stress - tense, restless, nervous, or anxious, or unable to sleep at night because your mind is troubled all the time - these days?: Not at all  Social Connections: Socially  Integrated (08/18/2023)  Received from [facility]  Social Connection and Isolation Panel  In a typical week, how many times do you talk on the phone with family, friends, or neighbors?: More than three times a week  How often do you get together with friends or relatives?: More than three times a week  How often do you attend church or religious services?: More than 4 times per year  Do you belong to any clubs or organizations such as church groups, unions, fraternal or athletic groups, or school groups?: Yes  How often do you attend meetings of the clubs or organizations you belong to?: More than 4 times per year  Are you married, widowed, divorced, separated, never married, or living with a partner?: Married  Housing Stability: Unknown (10/23/2023)  Housing Stability Vital Sign  Homeless in the Last Year: No   Objective:   Vitals:  BP: 135/75  Pulse: 94  Temp: 36.8 C (98.3 F)  SpO2: 97%  Weight: 83.5 kg (184 lb)  Height: 188 cm (6' 2)  PainSc: 0-No pain   Body mass index is 23.62 kg/m.  Physical Exam Constitutional:  General: He is not in acute distress. Appearance: Normal appearance.  HENT:  Head: Normocephalic and atraumatic.  Right Ear: External ear normal.  Left Ear: External ear normal.  Nose: Nose normal.  Mouth/Throat:  Mouth: Mucous membranes are moist.  Pharynx: Oropharynx is clear.  Eyes:  General: No scleral icterus. Extraocular Movements: Extraocular movements intact.  Conjunctiva/sclera: Conjunctivae normal.  Pupils: Pupils are equal, round, and reactive to light.  Cardiovascular:  Rate and Rhythm: Normal rate and regular rhythm.  Pulses: Normal pulses.  Heart sounds: Normal heart sounds.  Pulmonary:  Effort: Pulmonary effort is normal. No respiratory distress.  Breath sounds: Normal breath sounds.  Abdominal:  General: Abdomen is flat. Bowel sounds are normal. There is no distension.  Palpations: Abdomen is soft.  Tenderness: There is no  abdominal tenderness.  Genitourinary: Comments: There is a small reducible bulge in the right groin. There is no palpable bulge or impulse with straining in the left groin Musculoskeletal:  General: No swelling or deformity. Normal range of motion.  Cervical back: Normal range of motion and neck supple. No tenderness.  Skin: General: Skin is warm and dry.  Coloration: Skin is not jaundiced.  Neurological:  General: No focal deficit present.  Mental Status: He is alert and oriented to person, place, and time.  Psychiatric:  Mood and Affect: Mood normal.  Behavior: Behavior normal.     Labs, Imaging and Diagnostic Testing:  Assessment and Plan:   Diagnoses and all orders for this visit:  Right inguinal hernia   The patient appears to have a small relatively asymptomatic right inguinal hernia. Because of the risk of potential incarceration and strangulation I feel in the long run he will benefit from having this fixed. He is thinking about having it done later in the year. I have discussed with him in detail the risks and benefits  of the operation as well as some of the technical aspects including use of mesh and risk of chronic pain and he understands and wishes to proceed

## 2024-01-10 NOTE — Progress Notes (Signed)
 Assisted Dr. Cathlyn Fix with right, transabdominal plane block. Side rails up, monitors on throughout procedure. See vital signs in flow sheet. Tolerated Procedure well.

## 2024-01-10 NOTE — Discharge Instructions (Signed)
°  Post Anesthesia Home Care Instructions  Activity: Get plenty of rest for the remainder of the day. A responsible individual must stay with you for 24 hours following the procedure.  For the next 24 hours, DO NOT: -Drive a car -Advertising copywriter -Drink alcoholic beverages -Take any medication unless instructed by your physician -Make any legal decisions or sign important papers.  Meals: Start with liquid foods such as gelatin or soup. Progress to regular foods as tolerated. Avoid greasy, spicy, heavy foods. If nausea and/or vomiting occur, drink only clear liquids until the nausea and/or vomiting subsides. Call your physician if vomiting continues.  Special Instructions/Symptoms: Your throat may feel dry or sore from the anesthesia or the breathing tube placed in your throat during surgery. If this causes discomfort, gargle with warm salt water. The discomfort should disappear within 24 hours.  May have Tylenol  after 1:15pm if needed.     Regional Anesthesia Blocks  1. You may not be able to move or feel the blocked extremity after a regional anesthetic block. This may last may last from 3-48 hours after placement, but it will go away. The length of time depends on the medication injected and your individual response to the medication. As the nerves start to wake up, you may experience tingling as the movement and feeling returns to your extremity. If the numbness and inability to move your extremity has not gone away after 48 hours, please call your surgeon.   2. The extremity that is blocked will need to be protected until the numbness is gone and the strength has returned. Because you cannot feel it, you will need to take extra care to avoid injury. Because it may be weak, you may have difficulty moving it or using it. You may not know what position it is in without looking at it while the block is in effect.  3. For blocks in the legs and feet, returning to weight bearing and walking  needs to be done carefully. You will need to wait until the numbness is entirely gone and the strength has returned. You should be able to move your leg and foot normally before you try and bear weight or walk. You will need someone to be with you when you first try to ensure you do not fall and possibly risk injury.  4. Bruising and tenderness at the needle site are common side effects and will resolve in a few days.  5. Persistent numbness or new problems with movement should be communicated to the surgeon or the [facility] [phone] [facility] [phone]).

## 2024-01-10 NOTE — Anesthesia Postprocedure Evaluation (Signed)
 Anesthesia Post Note  Patient: Eric Duran  Procedure(s) Performed: RIGHT INGUINAL HERNIA REPAIR WITH MESH, ADULT (Right: Groin)     Patient location during evaluation: PACU Anesthesia Type: General and Regional Level of consciousness: awake and alert Pain management: pain level controlled Vital Signs Assessment: post-procedure vital signs reviewed and stable Respiratory status: spontaneous breathing, nonlabored ventilation, respiratory function stable and patient connected to nasal cannula oxygen Cardiovascular status: blood pressure returned to baseline and stable Postop Assessment: no apparent nausea or vomiting Anesthetic complications: no   No notable events documented.  Last Vitals:  Vitals:   01/10/24 1030 01/10/24 1058  BP: 137/84 (!) 146/75  Pulse: 61 (!) 54  Resp: 12 16  Temp:  (!) 36.3 C  SpO2: 100% 100%    Last Pain:  Vitals:   01/10/24 1058  PainSc: 0-No pain                 Cordella P Joevon Holliman

## 2024-01-10 NOTE — Anesthesia Procedure Notes (Signed)
 Procedure Name: Intubation Date/Time: 01/10/2024 8:20 AM  Performed by: Donnell Berwyn SQUIBB, CRNAPre-anesthesia Checklist: Patient identified, Emergency Drugs available, Suction available, Patient being monitored and Timeout performed Patient Re-evaluated:Patient Re-evaluated prior to induction Oxygen Delivery Method: Circle system utilized Preoxygenation: Pre-oxygenation with 100% oxygen Induction Type: IV induction Ventilation: Mask ventilation without difficulty Laryngoscope Size: Mac and 4 Grade View: Grade I Tube size: 7.5 mm Number of attempts: 1 Airway Equipment and Method: Stylet Placement Confirmation: ETT inserted through vocal cords under direct vision, positive ETCO2 and breath sounds checked- equal and bilateral Secured at: 23 cm Dental Injury: Teeth and Oropharynx as per pre-operative assessment

## 2024-01-10 NOTE — Anesthesia Preprocedure Evaluation (Addendum)
 Anesthesia Evaluation  Patient identified by MRN, date of birth, ID band Patient awake    Reviewed: Allergy & Precautions, NPO status , Patient's Chart, lab work & pertinent test results  Airway Mallampati: II  TM Distance: >3 FB Neck ROM: Full    Dental no notable dental hx.    Pulmonary asthma    Pulmonary exam normal        Cardiovascular hypertension, Pt. on medications  Rhythm:Regular Rate:Normal     Neuro/Psych negative neurological ROS  negative psych ROS   GI/Hepatic Neg liver ROS,GERD  Medicated,,  Endo/Other  negative endocrine ROS    Renal/GU Renal disease  negative genitourinary   Musculoskeletal  (+) Arthritis , Osteoarthritis,  Right inguinal hernia    Abdominal Normal abdominal exam  (+)   Peds  Hematology  (+) Blood dyscrasia, anemia   Anesthesia Other Findings   Reproductive/Obstetrics                              Anesthesia Physical Anesthesia Plan  ASA: 2  Anesthesia Plan: General   Post-op Pain Management: Regional block*, Tylenol  PO (pre-op)*, Gabapentin PO (pre-op)* and Celebrex PO (pre-op)*   Induction: Intravenous  PONV Risk Score and Plan: 2 and Ondansetron , Dexamethasone, Midazolam  and Treatment may vary due to age or medical condition  Airway Management Planned: Mask and Oral ETT  Additional Equipment: None  Intra-op Plan:   Post-operative Plan: Extubation in OR  Informed Consent: I have reviewed the patients History and Physical, chart, labs and discussed the procedure including the risks, benefits and alternatives for the proposed anesthesia with the patient or authorized representative who has indicated his/her understanding and acceptance.     Dental advisory given  Plan Discussed with: CRNA  Anesthesia Plan Comments:          Anesthesia Quick Evaluation

## 2024-01-10 NOTE — Op Note (Signed)
 01/10/2024  9:30 AM  PATIENT:  Eric Duran  68 y.o. male  PRE-OPERATIVE DIAGNOSIS:  RIGHT INGUINAL HERNIA  POST-OPERATIVE DIAGNOSIS:  RIGHT INGUINAL HERNIA  PROCEDURE:  Procedures with comments: RIGHT INGUINAL HERNIA REPAIR WITH MESH, ADULT (Right) - OPEN, RIGHT INGUINAL HERNIA, MESH GENERAL & TAP BLOCK  SURGEON:  Surgeons and Role:    * Curvin Deward MOULD, MD - Primary  PHYSICIAN ASSISTANT:   ASSISTANTS: none   ANESTHESIA:   local and general  EBL:  minimal   BLOOD ADMINISTERED:none  DRAINS: none   LOCAL MEDICATIONS USED:  MARCAINE      SPECIMEN:  Source of Specimen:  lipoma of cord  DISPOSITION OF SPECIMEN:  PATHOLOGY  COUNTS:  YES  TOURNIQUET:  * No tourniquets in log *  DICTATION: .Dragon Dictation  After informed consent was obtained the patient was brought to the operating room and placed in the supine position on the operating table.  After adequate induction of general anesthesia the patient's abdomen was and right groin were prepped with ChloraPrep, allowed to dry, and draped in usual sterile manner.  An appropriate timeout was performed.  The right groin was then infiltrated with quarter percent Marcaine .  A small incision was made from the edge of the pubic tubercle towards the anterior superior iliac spine with a 15 blade knife.  The incision was carried through the skin and subcutaneous tissue sharply with the electrocautery until the fascia of the external oblique was encountered.  A small bridging vein was clamped with hemostats, divided, and ligated with 3-0 silk ties.  The external oblique fascia was then opened along its fibers towards the apex of the external ring with a 15 blade knife and Metzenbaum scissors.  A wheat Lander retractor was deployed.  The cord was then dissected bluntly until it was able to be surrounded between 2 fingers.  1/2 inch Penrose drain was placed around the cord structures for retraction purposes.  The cord was then gently  skeletonized by blunt hemostat dissection.  I was able to identify a small lipoma of the cord that was excised sharply with the electrocautery and the base of the lipoma was clamped with hemostats, divided, and ligated with 3-0 silk ties.  There was no hernia sac with the cord.  There was a small broad-based defect medial to the cord consistent with a direct hernia.  This was able to be reduced easily and the floor of the canal was repaired with interrupted 0 Vicryl stitches.  During the dissection the ilioinguinal nerve was identified and involved with some scar tissue.  It was dissected out proximally distally, clamped with hemostats, divided, and ligated with 3-0 silk ties.  Next a 3 x 6 piece of ultra Pro mesh was chosen and cut to the appropriate size.  The mesh was sewed inferiorly to the shelving edge of the inguinal ligament with a running 2-0 Prolene stitch.  Tails were cut in the mesh laterally and the tails were wrapped around the cord structures.  Medially and superiorly the mesh was sewed to the aponeurotic strength layer of the transversalis with interrupted 2-0 Prolene vertical mattress stitches.  Lateral to the cord the tails of the mesh were anchored to the shelving edge of the inguinal ligament with interrupted 2-0 Prolene stitches.  Once this was accomplished the mesh appeared to be in good position and the hernia seem well repaired.  The wound was irrigated with copious amounts of saline.  The external oblique fascia was then reapproximated  with a running 2-0 Vicryl stitch.  The wound was infiltrated with more quarter percent Marcaine .  The subcutaneous fascia was closed with a running 3-0 Vicryl stitch.  The skin was then closed with a running 4-0 Monocryl subcuticular stitch.  Dermabond dressings were applied.  The patient tolerated the procedure well.  At the end of the case all needle sponge and instrument counts were correct.  The patient was then awakened and taken to recovery in stable  condition.  PLAN OF CARE: Discharge to home after PACU  PATIENT DISPOSITION:  PACU - hemodynamically stable.   Delay start of Pharmacological VTE agent (>24hrs) due to surgical blood loss or risk of bleeding: not applicable

## 2024-01-13 ENCOUNTER — Encounter (HOSPITAL_BASED_OUTPATIENT_CLINIC_OR_DEPARTMENT_OTHER): Payer: Self-pay | Admitting: General Surgery

## 2024-01-13 LAB — SURGICAL PATHOLOGY

## 2024-02-25 ENCOUNTER — Encounter: Payer: Self-pay | Admitting: Family Medicine

## 2024-02-25 DIAGNOSIS — Z1211 Encounter for screening for malignant neoplasm of colon: Secondary | ICD-10-CM | POA: Insufficient documentation

## 2024-02-25 DIAGNOSIS — K5900 Constipation, unspecified: Secondary | ICD-10-CM | POA: Insufficient documentation

## 2024-02-25 DIAGNOSIS — K5730 Diverticulosis of large intestine without perforation or abscess without bleeding: Secondary | ICD-10-CM | POA: Insufficient documentation

## 2024-02-26 ENCOUNTER — Ambulatory Visit (INDEPENDENT_AMBULATORY_CARE_PROVIDER_SITE_OTHER): Admitting: Family Medicine

## 2024-02-26 ENCOUNTER — Encounter: Payer: Self-pay | Admitting: Family Medicine

## 2024-02-26 VITALS — BP 112/72 | HR 71 | Temp 98.1°F | Resp 12 | Ht 73.0 in | Wt 186.2 lb

## 2024-02-26 DIAGNOSIS — I10 Essential (primary) hypertension: Secondary | ICD-10-CM

## 2024-02-26 DIAGNOSIS — I77819 Aortic ectasia, unspecified site: Secondary | ICD-10-CM | POA: Diagnosis not present

## 2024-02-26 DIAGNOSIS — E785 Hyperlipidemia, unspecified: Secondary | ICD-10-CM

## 2024-02-26 DIAGNOSIS — R7309 Other abnormal glucose: Secondary | ICD-10-CM | POA: Diagnosis not present

## 2024-02-26 LAB — COMPREHENSIVE METABOLIC PANEL WITH GFR
ALT: 26 U/L (ref 3–53)
AST: 29 U/L (ref 5–37)
Albumin: 4.6 g/dL (ref 3.5–5.2)
Alkaline Phosphatase: 51 U/L (ref 39–117)
BUN: 20 mg/dL (ref 6–23)
CO2: 31 meq/L (ref 19–32)
Calcium: 9.7 mg/dL (ref 8.4–10.5)
Chloride: 100 meq/L (ref 96–112)
Creatinine, Ser: 1.15 mg/dL (ref 0.40–1.50)
GFR: 64.5 mL/min
Glucose, Bld: 89 mg/dL (ref 70–99)
Potassium: 5 meq/L (ref 3.5–5.1)
Sodium: 136 meq/L (ref 135–145)
Total Bilirubin: 0.9 mg/dL (ref 0.2–1.2)
Total Protein: 6.9 g/dL (ref 6.0–8.3)

## 2024-02-26 LAB — LIPID PANEL
Cholesterol: 177 mg/dL (ref 28–200)
HDL: 91.4 mg/dL
LDL Cholesterol: 79 mg/dL (ref 10–99)
NonHDL: 85.94
Total CHOL/HDL Ratio: 2
Triglycerides: 35 mg/dL (ref 10.0–149.0)
VLDL: 7 mg/dL (ref 0.0–40.0)

## 2024-02-26 LAB — HEMOGLOBIN A1C: Hgb A1c MFr Bld: 5.6 % (ref 4.6–6.5)

## 2024-02-26 MED ORDER — PRAVASTATIN SODIUM 40 MG PO TABS: 40.0000 mg | ORAL_TABLET | Freq: Every day | ORAL | 1 refills | Status: AC

## 2024-02-26 MED ORDER — LOSARTAN POTASSIUM 50 MG PO TABS: 50.0000 mg | ORAL_TABLET | Freq: Every day | ORAL | 1 refills | Status: AC

## 2024-02-26 NOTE — Patient Instructions (Signed)
 Thank you for coming in today. No change in medications at this time. If there are any concerns on your bloodwork, I will let you know.  I ordered the repeat CT scan for the aorta in June.  Take care!

## 2024-02-26 NOTE — Progress Notes (Signed)
 "  Subjective:  Patient ID: Eric Duran, male    DOB: [birth year]  Age: 69 y.o. MRN: [id number]  CC:  Chief Complaint  Patient presents with   Follow-up    6 month follow up visit. Patient did not get a call to schedule his colonoscopy. Gave patient contact information for Dr. Belvie Just [address], [place] [address] [phone]    HPI Eric Duran presents for follow up.   No new health concerns.  16yo grandson Darol has moved here from [place] [place]. Better family situation here.   Hyperlipidemia: Pravastatin  40 mg daily. 25th percentile coronary calcium scoring in 2024.  Mild ascending aortic dilatation of 4 cm with annual imaging by CTA/MRI recommended.  Intermittent statin dosing have been discussed previously due to some possible brain fog on meds.  No change in symptoms off statin, had returned to daily dosing.  LDL 71 in July.  No new med side effects.  07/22/23: 4.0 cm ascending aortic aneurysm, stable.  Lab Results  Component Value Date   CHOL 160 08/22/2023   HDL 81.90 08/22/2023   LDLCALC 71 08/22/2023   LDLDIRECT 173.8 05/05/2012   TRIG 35.0 08/22/2023   CHOLHDL 2 08/22/2023   Lab Results  Component Value Date   ALT 21 08/22/2023   AST 18 08/22/2023   ALKPHOS 48 08/22/2023   BILITOT 1.0 08/22/2023   Hypertension: Losartan  50 mg daily. Home readings: no recent readings. No new side effects.  BP Readings from Last 3 Encounters:  02/26/24 112/72  01/10/24 (!) 146/75  10/03/23 126/60   Lab Results  Component Value Date   CREATININE 1.07 08/22/2023   Prediabetes: Borderline A1c 5.04 January 2022, then normalized at 5.4, 5.6, slight elevation at 5.9 last July. No diet/exercise changes.  Exercising 5-6 days per week.  Lab Results  Component Value Date   HGBA1C 5.9 08/22/2023   Wt Readings from Last 3 Encounters:  02/26/24 186 lb 3.2 oz (84.5 kg)  01/10/24 185 lb 3 oz (84 kg)  10/03/23 181 lb 12.8 oz (82.5 kg)   Health  maintenance Right inguinal hernia surgery in December, doing well. Colonoscopy, phone number provided to check status of repeat colonoscopy. Declines flu/covid vaccines, and tdap.   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   Encounter for screening for malignant neoplasm of colon 02/25/2024   Right inguinal hernia 10/23/2023   Excessive daytime sleepiness 03/26/2023   Witnessed episode of apnea 03/26/2023   Snoring 02/18/2023   Autoimmune disorder 02/18/2023   Previous back surgery 11/24/2019   Left leg paresthesias 11/11/2018   Ankle weakness 09/16/2018   S/P lumbar laminectomy 02/03/2018   HLD (hyperlipidemia) 01/30/2018   Lumbar herniated disc 01/09/2018   Lumbosacral radiculopathy at S1 01/09/2018   Hypertension 08/20/2016   Abdominal pain, right upper quadrant 02/22/2011   Crohn's disease (HCC) 02/13/2011   Otitis media, suppurative 11/09/2009   Dyslipidemia 07/22/2009   Asthma 07/22/2009   GERD 07/22/2009   PREDIABETES 07/22/2009   Past Medical History:  Diagnosis Date   Allergy Birth   ANEMIA, IRON DEFICIENCY 07/22/2009   Arthritis    back   ASTHMA 07/22/2009   mild   Asthma    as child   GERD (gastroesophageal reflux disease)    History of kidney stones    HYPERLIPIDEMIA 07/22/2009   Hypertension    Kidney stones 01/30/1999   6 times in past  OTITIS MEDIA, SUPPURATIVE 11/09/2009   PREDIABETES 07/22/2009   pt does not monitor cbg at home   Past Surgical History:  Procedure Laterality Date   APPENDECTOMY     BACK SURGERY     lumbar x 2   BIOPSY BOWEL  01/30/1980   exploration bowel surgery, chrons disease   CHOLECYSTECTOMY  02/26/2011   Procedure: LAPAROSCOPIC CHOLECYSTECTOMY WITH INTRAOPERATIVE CHOLANGIOGRAM;  Surgeon: Krystal CHRISTELLA Spinner, MD;  Location: [facility];  Service: General;  Laterality: N/A;   colonscopy  2012 and 2006   HERNIA REPAIR  2005 - approx   INGUINAL  HERNIA REPAIR Right 01/10/2024   Procedure: RIGHT INGUINAL HERNIA REPAIR WITH MESH, ADULT;  Surgeon: Curvin Deward MOULD, MD;  Location: [facility];  Service: General;  Laterality: Right;  OPEN, RIGHT INGUINAL HERNIA, MESH GENERAL & TAP BLOCK   SMALL INTESTINE SURGERY     SPINE SURGERY     TONSILLECTOMY  1960 - approx   Allergies[1] Prior to Admission medications  Medication Sig Start Date End Date Taking? Authorizing Provider  co-enzyme Q-10 30 MG capsule Take 30 mg by mouth 3 (three) times daily. Patient taking differently: Take 30 mg by mouth daily.   Yes [provider]  Levomefolate Glucosamine (METHYL-FOLATE) 1700 MCG CAPS Take by mouth.   Yes [provider]  losartan  (COZAAR ) 50 MG tablet Take 1 tablet (50 mg total) by mouth daily. 08/27/23  Yes Levora Eric SAUNDERS, MD  MAGNESIUM GLYCINATE ADVANCED PO Take by mouth.   Yes [provider]  Omega 3-6-9 Fatty Acids (OMEGA 3-6-9 COMPLEX) CAPS Take by mouth.   Yes [provider]  Omega-3 Fatty Acids (OMEGA 3 FISH OIL) 1000 MG CAPS Take by mouth.   Yes [provider]  pravastatin  (PRAVACHOL ) 40 MG tablet TAKE 1 TABLEY BY MOUTH DAILY MAY START WITH INTERMITTENT DOSING AND INCREASE AS TOLERATED 11/01/23  Yes Levora Eric SAUNDERS, MD  Turmeric 500 MG CAPS Take by mouth.   Yes [provider]  oxyCODONE  (ROXICODONE ) 5 MG immediate release tablet Take 1 tablet (5 mg total) by mouth every 6 (six) hours as needed. Patient not taking: Reported on 02/26/2024 01/10/24 01/09/25  Curvin Deward MOULD, MD   Social History   Socioeconomic History   Marital status: Married    Spouse name: Not on file   Number of children: Not on file   Years of education: Not on file   Highest education level: Doctorate  Occupational History   Not on file  Tobacco Use   Smoking status: Never   Smokeless tobacco: Never  Vaping Use   Vaping status: Never Used  Substance and Sexual Activity   Alcohol use: Yes     Comment: occasional glass of wine   Drug use: No   Sexual activity: Yes  Other Topics Concern   Not on file  Social History Narrative   Lives at home with wife    Right handed   Caffeine: 2 cups/day   Social Drivers of Health   Tobacco Use: Low Risk (02/26/2024)   Patient History    Smoking Tobacco Use: Never    Smokeless Tobacco Use: Never    Passive Exposure: Not on file  Financial Resource Strain: Low Risk (02/22/2024)   Overall Financial Resource Strain (CARDIA)    Difficulty of Paying Living Expenses: Not hard at all  Food Insecurity: No Food Insecurity (02/22/2024)   Epic    Worried About Radiation Protection Practitioner of Food in the Last Year: Never  true    Ran Out of Food in the Last Year: Never true  Transportation Needs: No Transportation Needs (02/22/2024)   Epic    Lack of Transportation (Medical): No    Lack of Transportation (Non-Medical): No  Physical Activity: Sufficiently Active (02/22/2024)   Exercise Vital Sign    Days of Exercise per Week: 5 days    Minutes of Exercise per Session: 60 min  Stress: No Stress Concern Present (02/22/2024)   [employer] of Occupational Health - Occupational Stress Questionnaire    Feeling of Stress: Not at all  Social Connections: Socially Integrated (02/22/2024)   Social Connection and Isolation Panel    Frequency of Communication with Friends and Family: Once a week    Frequency of Social Gatherings with Friends and Family: More than three times a week    Attends Religious Services: More than 4 times per year    Active Member of Clubs or Organizations: Yes    Attends Banker Meetings: More than 4 times per year    Marital Status: Married  Catering Manager Violence: Not on file  Depression (PHQ2-9): Low Risk (08/22/2023)   Depression (PHQ2-9)    PHQ-2 Score: 0  Alcohol Screen: Low Risk (02/22/2024)   Alcohol Screen    Last Alcohol Screening Score (AUDIT): 2  Housing: Unknown (02/22/2024)   Epic    Unable to Pay for  Housing in the Last Year: No    Number of Times Moved in the Last Year: Not on file    Homeless in the Last Year: No  Utilities: Not At Risk (05/09/2022)   [facility] Utilities    Threatened with loss of utilities: No  Health Literacy: Not on file    Review of Systems  Constitutional:  Negative for fatigue and unexpected weight change.  Eyes:  Negative for visual disturbance.  Respiratory:  Negative for cough, chest tightness and shortness of breath.   Cardiovascular:  Negative for chest pain, palpitations and leg swelling.  Gastrointestinal:  Negative for abdominal pain and blood in stool.  Neurological:  Negative for dizziness, light-headedness and headaches.     Objective:   Vitals:   02/26/24 1035  BP: 112/72  Pulse: 71  Resp: 12  Temp: 98.1 F (36.7 C)  TempSrc: Temporal  SpO2: 98%  Weight: 186 lb 3.2 oz (84.5 kg)  Height: 6' 1 (1.854 m)     Physical Exam Vitals reviewed.  Constitutional:      Appearance: He is well-developed.  HENT:     Head: Normocephalic and atraumatic.  Neck:     Vascular: No carotid bruit or JVD.  Cardiovascular:     Rate and Rhythm: Normal rate and regular rhythm.     Heart sounds: Normal heart sounds. No murmur heard. Pulmonary:     Effort: Pulmonary effort is normal.     Breath sounds: Normal breath sounds. No rales.  Musculoskeletal:     Right lower leg: No edema.     Left lower leg: No edema.  Skin:    General: Skin is warm and dry.  Neurological:     Mental Status: He is alert and oriented to person, place, and time.  Psychiatric:        Mood and Affect: Mood normal.        Assessment & Plan:  Eric Duran is a 69 y.o. male . Dyslipidemia - Plan: Lipid panel, pravastatin  (PRAVACHOL ) 40 MG tablet  -  Stable, tolerating current regimen. Medications refilled. Labs pending  as above.   Primary hypertension - Plan: Comprehensive metabolic panel with GFR, losartan  (COZAAR ) 50 MG tablet  -  Stable, tolerating current regimen.  Medications refilled. Labs pending as above.   Aortic dilatation - Plan: CT ANGIO CHEST AORTA W/CM & OR WO/CM  - Stable on prior imaging noted, ordered new imaging, continue to monitor.  Elevated hemoglobin A1c - Plan: Hemoglobin A1c  - Check A1c and adjust plan accordingly.  Continue to watch diet, exercise.  Meds ordered this encounter  Medications   losartan  (COZAAR ) 50 MG tablet    Sig: Take 1 tablet (50 mg total) by mouth daily.    Dispense:  90 tablet    Refill:  1   pravastatin  (PRAVACHOL ) 40 MG tablet    Sig: Take 1 tablet (40 mg total) by mouth daily.    Dispense:  90 tablet    Refill:  1   Patient Instructions  Thank you for coming in today. No change in medications at this time. If there are any concerns on your bloodwork, I will let you know.  I ordered the repeat CT scan for the aorta in June.  Take care!     Signed,   Eric Pines, MD [facility], [facility] 02/26/24 11:34 AM      [1] No Known Allergies  "

## 2024-02-28 ENCOUNTER — Ambulatory Visit: Payer: Self-pay | Admitting: Family Medicine

## 2024-08-28 ENCOUNTER — Encounter: Admitting: Family Medicine
